# Patient Record
Sex: Male | Born: 1965 | Race: Black or African American | Hispanic: No | Marital: Single | Smoking: Former smoker
Health system: Southern US, Community
[De-identification: ages and names within clinical notes are randomized; demographics above are authoritative.]

## PROBLEM LIST (undated history)

## (undated) DIAGNOSIS — K2980 Duodenitis without bleeding: Secondary | ICD-10-CM

## (undated) DIAGNOSIS — K2210 Ulcer of esophagus without bleeding: Secondary | ICD-10-CM

## (undated) DIAGNOSIS — K2940 Chronic atrophic gastritis without bleeding: Secondary | ICD-10-CM

## (undated) DIAGNOSIS — D751 Secondary polycythemia: Secondary | ICD-10-CM

## (undated) DIAGNOSIS — F329 Major depressive disorder, single episode, unspecified: Secondary | ICD-10-CM

## (undated) DIAGNOSIS — R569 Unspecified convulsions: Secondary | ICD-10-CM

## (undated) DIAGNOSIS — F419 Anxiety disorder, unspecified: Secondary | ICD-10-CM

## (undated) DIAGNOSIS — D649 Anemia, unspecified: Secondary | ICD-10-CM

## (undated) DIAGNOSIS — G809 Cerebral palsy, unspecified: Secondary | ICD-10-CM

## (undated) DIAGNOSIS — F32A Depression, unspecified: Secondary | ICD-10-CM

## (undated) HISTORY — DX: Chronic atrophic gastritis without bleeding: K29.40

## (undated) HISTORY — DX: Secondary polycythemia: D75.1

## (undated) HISTORY — DX: Ulcer of esophagus without bleeding: K22.10

## (undated) HISTORY — DX: Duodenitis without bleeding: K29.80

## (undated) HISTORY — DX: Anemia, unspecified: D64.9

---

## 2009-12-24 ENCOUNTER — Emergency Department (HOSPITAL_COMMUNITY): Admission: EM | Admit: 2009-12-24 | Discharge: 2009-12-24 | Payer: Self-pay | Admitting: Emergency Medicine

## 2011-03-25 ENCOUNTER — Emergency Department (HOSPITAL_COMMUNITY)
Admission: EM | Admit: 2011-03-25 | Discharge: 2011-03-25 | Disposition: A | Discharge status: Home / Self Care | Payer: Medicare Other | Attending: Emergency Medicine | Admitting: Emergency Medicine

## 2011-03-25 ENCOUNTER — Emergency Department (HOSPITAL_COMMUNITY): Payer: Medicare Other

## 2011-03-25 DIAGNOSIS — M546 Pain in thoracic spine: Secondary | ICD-10-CM | POA: Insufficient documentation

## 2011-03-25 DIAGNOSIS — F411 Generalized anxiety disorder: Secondary | ICD-10-CM | POA: Insufficient documentation

## 2011-03-25 DIAGNOSIS — G809 Cerebral palsy, unspecified: Secondary | ICD-10-CM | POA: Insufficient documentation

## 2011-03-25 DIAGNOSIS — I10 Essential (primary) hypertension: Secondary | ICD-10-CM | POA: Insufficient documentation

## 2011-03-25 DIAGNOSIS — M4120 Other idiopathic scoliosis, site unspecified: Secondary | ICD-10-CM | POA: Insufficient documentation

## 2011-03-25 DIAGNOSIS — Z79899 Other long term (current) drug therapy: Secondary | ICD-10-CM | POA: Insufficient documentation

## 2011-03-25 LAB — DIFFERENTIAL
Basophils Absolute: 0 10*3/uL (ref 0.0–0.1)
Basophils Relative: 1 % (ref 0–1)
Eosinophils Relative: 3 % (ref 0–5)
Lymphocytes Relative: 28 % (ref 12–46)
Monocytes Absolute: 0.9 10*3/uL (ref 0.1–1.0)
Neutro Abs: 4 10*3/uL (ref 1.7–7.7)

## 2011-03-25 LAB — URINALYSIS, ROUTINE W REFLEX MICROSCOPIC
Bilirubin Urine: NEGATIVE
Glucose, UA: NEGATIVE mg/dL
Hgb urine dipstick: NEGATIVE
Protein, ur: NEGATIVE mg/dL
pH: 6.5 (ref 5.0–8.0)

## 2011-03-25 LAB — COMPREHENSIVE METABOLIC PANEL
ALT: 14 U/L (ref 0–53)
Albumin: 3.3 g/dL — ABNORMAL LOW (ref 3.5–5.2)
Alkaline Phosphatase: 67 U/L (ref 39–117)
BUN: 14 mg/dL (ref 6–23)
Chloride: 98 mEq/L (ref 96–112)
Potassium: 3.9 mEq/L (ref 3.5–5.1)
Sodium: 133 mEq/L — ABNORMAL LOW (ref 135–145)
Total Bilirubin: 0.4 mg/dL (ref 0.3–1.2)
Total Protein: 7.3 g/dL (ref 6.0–8.3)

## 2011-03-25 LAB — CBC
HCT: 38.1 % — ABNORMAL LOW (ref 39.0–52.0)
Hemoglobin: 12.5 g/dL — ABNORMAL LOW (ref 13.0–17.0)
MCHC: 32.8 g/dL (ref 30.0–36.0)
RDW: 12.9 % (ref 11.5–15.5)
WBC: 7.3 10*3/uL (ref 4.0–10.5)

## 2011-03-27 LAB — URINE CULTURE: Colony Count: 3000

## 2011-03-31 LAB — CULTURE, BLOOD (ROUTINE X 2)
Culture: NO GROWTH
Culture: NO GROWTH

## 2011-04-02 LAB — CBC
HCT: 38.7 % — ABNORMAL LOW (ref 39.0–52.0)
MCV: 90 fL (ref 78.0–100.0)
Platelets: 235 10*3/uL (ref 150–400)
RDW: 14.4 % (ref 11.5–15.5)

## 2011-04-02 LAB — DIFFERENTIAL
Basophils Absolute: 0 10*3/uL (ref 0.0–0.1)
Lymphocytes Relative: 7 % — ABNORMAL LOW (ref 12–46)
Monocytes Absolute: 0.4 10*3/uL (ref 0.1–1.0)
Neutro Abs: 12.1 10*3/uL — ABNORMAL HIGH (ref 1.7–7.7)
Neutrophils Relative %: 89 % — ABNORMAL HIGH (ref 43–77)

## 2011-04-02 LAB — COMPREHENSIVE METABOLIC PANEL
AST: 21 U/L (ref 0–37)
Albumin: 4 g/dL (ref 3.5–5.2)
BUN: 28 mg/dL — ABNORMAL HIGH (ref 6–23)
CO2: 26 mEq/L (ref 19–32)
Calcium: 9.3 mg/dL (ref 8.4–10.5)
Creatinine, Ser: 0.84 mg/dL (ref 0.4–1.5)
GFR calc non Af Amer: >60 mL/min (ref >60)

## 2011-04-02 LAB — PHENOBARBITAL LEVEL: Phenobarbital: 22 ug/mL (ref 15.0–40.0)

## 2012-08-18 DIAGNOSIS — E7800 Pure hypercholesterolemia, unspecified: Secondary | ICD-10-CM | POA: Diagnosis not present

## 2014-01-08 DIAGNOSIS — E7800 Pure hypercholesterolemia, unspecified: Secondary | ICD-10-CM | POA: Diagnosis not present

## 2014-01-08 DIAGNOSIS — G808 Other cerebral palsy: Secondary | ICD-10-CM | POA: Diagnosis not present

## 2014-01-08 DIAGNOSIS — R569 Unspecified convulsions: Secondary | ICD-10-CM | POA: Diagnosis not present

## 2014-01-26 DIAGNOSIS — G808 Other cerebral palsy: Secondary | ICD-10-CM | POA: Diagnosis not present

## 2014-01-26 DIAGNOSIS — R609 Edema, unspecified: Secondary | ICD-10-CM | POA: Diagnosis not present

## 2014-02-01 DIAGNOSIS — N39498 Other specified urinary incontinence: Secondary | ICD-10-CM | POA: Diagnosis not present

## 2014-03-17 DIAGNOSIS — N39498 Other specified urinary incontinence: Secondary | ICD-10-CM | POA: Diagnosis not present

## 2014-04-29 DIAGNOSIS — G808 Other cerebral palsy: Secondary | ICD-10-CM | POA: Diagnosis not present

## 2014-04-29 DIAGNOSIS — E7800 Pure hypercholesterolemia, unspecified: Secondary | ICD-10-CM | POA: Diagnosis not present

## 2014-04-29 DIAGNOSIS — R569 Unspecified convulsions: Secondary | ICD-10-CM | POA: Diagnosis not present

## 2014-04-30 DIAGNOSIS — K2980 Duodenitis without bleeding: Secondary | ICD-10-CM

## 2014-04-30 DIAGNOSIS — K2210 Ulcer of esophagus without bleeding: Secondary | ICD-10-CM

## 2014-04-30 DIAGNOSIS — K2940 Chronic atrophic gastritis without bleeding: Secondary | ICD-10-CM

## 2014-04-30 HISTORY — DX: Ulcer of esophagus without bleeding: K22.10

## 2014-04-30 HISTORY — DX: Duodenitis without bleeding: K29.80

## 2014-04-30 HISTORY — DX: Chronic atrophic gastritis without bleeding: K29.40

## 2014-05-19 DIAGNOSIS — R609 Edema, unspecified: Secondary | ICD-10-CM | POA: Diagnosis not present

## 2014-05-19 DIAGNOSIS — G808 Other cerebral palsy: Secondary | ICD-10-CM | POA: Diagnosis not present

## 2014-05-19 DIAGNOSIS — R799 Abnormal finding of blood chemistry, unspecified: Secondary | ICD-10-CM | POA: Diagnosis not present

## 2014-05-21 ENCOUNTER — Encounter (HOSPITAL_COMMUNITY): Payer: Self-pay | Admitting: Emergency Medicine

## 2014-05-21 ENCOUNTER — Inpatient Hospital Stay (HOSPITAL_COMMUNITY)
Admission: EM | Admit: 2014-05-21 | Discharge: 2014-05-26 | DRG: 812 | Disposition: A | Discharge status: Nursing Home (Includes ICF) | Payer: Medicare Other | Attending: Internal Medicine | Admitting: Internal Medicine

## 2014-05-21 DIAGNOSIS — K620 Anal polyp: Secondary | ICD-10-CM | POA: Diagnosis not present

## 2014-05-21 DIAGNOSIS — D128 Benign neoplasm of rectum: Secondary | ICD-10-CM | POA: Diagnosis not present

## 2014-05-21 DIAGNOSIS — D649 Anemia, unspecified: Secondary | ICD-10-CM | POA: Diagnosis not present

## 2014-05-21 DIAGNOSIS — K208 Other esophagitis without bleeding: Secondary | ICD-10-CM | POA: Diagnosis present

## 2014-05-21 DIAGNOSIS — Z87891 Personal history of nicotine dependence: Secondary | ICD-10-CM | POA: Diagnosis not present

## 2014-05-21 DIAGNOSIS — F329 Major depressive disorder, single episode, unspecified: Secondary | ICD-10-CM | POA: Diagnosis present

## 2014-05-21 DIAGNOSIS — K449 Diaphragmatic hernia without obstruction or gangrene: Secondary | ICD-10-CM | POA: Diagnosis present

## 2014-05-21 DIAGNOSIS — F411 Generalized anxiety disorder: Secondary | ICD-10-CM | POA: Diagnosis present

## 2014-05-21 DIAGNOSIS — K621 Rectal polyp: Secondary | ICD-10-CM | POA: Diagnosis present

## 2014-05-21 DIAGNOSIS — K2940 Chronic atrophic gastritis without bleeding: Secondary | ICD-10-CM | POA: Diagnosis not present

## 2014-05-21 DIAGNOSIS — D129 Benign neoplasm of anus and anal canal: Secondary | ICD-10-CM | POA: Diagnosis not present

## 2014-05-21 DIAGNOSIS — F419 Anxiety disorder, unspecified: Secondary | ICD-10-CM

## 2014-05-21 DIAGNOSIS — D509 Iron deficiency anemia, unspecified: Principal | ICD-10-CM | POA: Diagnosis present

## 2014-05-21 DIAGNOSIS — K922 Gastrointestinal hemorrhage, unspecified: Secondary | ICD-10-CM

## 2014-05-21 DIAGNOSIS — D131 Benign neoplasm of stomach: Secondary | ICD-10-CM | POA: Diagnosis present

## 2014-05-21 DIAGNOSIS — F3289 Other specified depressive episodes: Secondary | ICD-10-CM | POA: Diagnosis present

## 2014-05-21 DIAGNOSIS — G40909 Epilepsy, unspecified, not intractable, without status epilepticus: Secondary | ICD-10-CM | POA: Diagnosis present

## 2014-05-21 DIAGNOSIS — G809 Cerebral palsy, unspecified: Secondary | ICD-10-CM

## 2014-05-21 DIAGNOSIS — G808 Other cerebral palsy: Secondary | ICD-10-CM | POA: Diagnosis not present

## 2014-05-21 DIAGNOSIS — D638 Anemia in other chronic diseases classified elsewhere: Secondary | ICD-10-CM | POA: Diagnosis present

## 2014-05-21 DIAGNOSIS — R1013 Epigastric pain: Secondary | ICD-10-CM | POA: Diagnosis not present

## 2014-05-21 DIAGNOSIS — K209 Esophagitis, unspecified without bleeding: Secondary | ICD-10-CM | POA: Diagnosis not present

## 2014-05-21 DIAGNOSIS — K2980 Duodenitis without bleeding: Secondary | ICD-10-CM | POA: Diagnosis not present

## 2014-05-21 DIAGNOSIS — K2990 Gastroduodenitis, unspecified, without bleeding: Secondary | ICD-10-CM

## 2014-05-21 DIAGNOSIS — K2970 Gastritis, unspecified, without bleeding: Secondary | ICD-10-CM | POA: Diagnosis present

## 2014-05-21 DIAGNOSIS — F32A Depression, unspecified: Secondary | ICD-10-CM | POA: Diagnosis present

## 2014-05-21 HISTORY — DX: Major depressive disorder, single episode, unspecified: F32.9

## 2014-05-21 HISTORY — DX: Anxiety disorder, unspecified: F41.9

## 2014-05-21 HISTORY — DX: Cerebral palsy, unspecified: G80.9

## 2014-05-21 HISTORY — DX: Unspecified convulsions: R56.9

## 2014-05-21 HISTORY — DX: Depression, unspecified: F32.A

## 2014-05-21 LAB — COMPREHENSIVE METABOLIC PANEL
ALT: 13 U/L (ref 0–53)
AST: 16 U/L (ref 0–37)
Albumin: 2.8 g/dL — ABNORMAL LOW (ref 3.5–5.2)
Alkaline Phosphatase: 68 U/L (ref 39–117)
BILIRUBIN TOTAL: 0.2 mg/dL — AB (ref 0.3–1.2)
BUN: 14 mg/dL (ref 6–23)
CHLORIDE: 102 meq/L (ref 96–112)
CO2: 25 meq/L (ref 19–32)
CREATININE: 0.42 mg/dL — AB (ref 0.50–1.35)
Calcium: 8.8 mg/dL (ref 8.4–10.5)
GFR calc Af Amer: >90 mL/min (ref >90)
GLUCOSE: 89 mg/dL (ref 70–99)
Potassium: 4.1 mEq/L (ref 3.7–5.3)
Sodium: 139 mEq/L (ref 137–147)
Total Protein: 8 g/dL (ref 6.0–8.3)

## 2014-05-21 LAB — CBC WITH DIFFERENTIAL/PLATELET
BASOS ABS: 0 10*3/uL (ref 0.0–0.1)
Basophils Relative: 0 % (ref 0–1)
EOS ABS: 0 10*3/uL (ref 0.0–0.7)
Eosinophils Relative: 0 % (ref 0–5)
HEMATOCRIT: 27.1 % — AB (ref 39.0–52.0)
Hemoglobin: 6.6 g/dL — CL (ref 13.0–17.0)
LYMPHS ABS: 1.9 10*3/uL (ref 0.7–4.0)
Lymphocytes Relative: 25 % (ref 12–46)
MCH: 15.4 pg — ABNORMAL LOW (ref 26.0–34.0)
MCHC: 24.4 g/dL — AB (ref 30.0–36.0)
MCV: 63.2 fL — AB (ref 78.0–100.0)
MONO ABS: 0.4 10*3/uL (ref 0.1–1.0)
Monocytes Relative: 5 % (ref 3–12)
NEUTROS ABS: 5.3 10*3/uL (ref 1.7–7.7)
Neutrophils Relative %: 70 % (ref 43–77)
Platelets: 574 10*3/uL — ABNORMAL HIGH (ref 150–400)
RBC: 4.29 MIL/uL (ref 4.22–5.81)
RDW: 22.5 % — AB (ref 11.5–15.5)
WBC: 7.6 10*3/uL (ref 4.0–10.5)

## 2014-05-21 LAB — SAMPLE TO BLOOD BANK

## 2014-05-21 LAB — APTT: APTT: 25 s (ref 24–37)

## 2014-05-21 LAB — PROTIME-INR
INR: 1.26 (ref 0.00–1.49)
Prothrombin Time: 15.5 seconds — ABNORMAL HIGH (ref 11.6–15.2)

## 2014-05-21 LAB — POC OCCULT BLOOD, ED: FECAL OCCULT BLD: POSITIVE — AB

## 2014-05-21 LAB — PREPARE RBC (CROSSMATCH)

## 2014-05-21 LAB — ABO/RH: ABO/RH(D): A POS

## 2014-05-21 MED ORDER — PHENOBARBITAL 32.4 MG PO TABS
32.4000 mg | ORAL_TABLET | Freq: Every day | ORAL | Status: DC
Start: 2014-05-22 — End: 2014-05-26
  Administered 2014-05-22 – 2014-05-26 (×4): 32.4 mg via ORAL
  Filled 2014-05-21 (×4): qty 1

## 2014-05-21 MED ORDER — PANTOPRAZOLE SODIUM 40 MG IV SOLR: 8.0000 mg/h | INTRAVENOUS | Status: DC

## 2014-05-21 MED ORDER — ONDANSETRON HCL 4 MG PO TABS: 4.0000 mg | ORAL_TABLET | Freq: Four times a day (QID) | ORAL | Status: DC | PRN

## 2014-05-21 MED ORDER — PHENOBARBITAL 32.4 MG PO TABS
64.8000 mg | ORAL_TABLET | Freq: Every day | ORAL | Status: DC
  Administered 2014-05-21 – 2014-05-25 (×5): 64.8 mg via ORAL
  Filled 2014-05-21 (×5): qty 2

## 2014-05-21 MED ORDER — PHENOBARBITAL 30 MG PO TABS: 30.0000 mg | ORAL_TABLET | Freq: Two times a day (BID) | ORAL | Status: DC

## 2014-05-21 MED ORDER — SODIUM CHLORIDE 0.9 % IJ SOLN
3.0000 mL | Freq: Two times a day (BID) | INTRAMUSCULAR | Status: DC
  Administered 2014-05-22 – 2014-05-26 (×3): 3 mL via INTRAVENOUS

## 2014-05-21 MED ORDER — SODIUM CHLORIDE 0.9 % IV SOLN
8.0000 mg/h | INTRAVENOUS | Status: DC
  Filled 2014-05-21 (×7): qty 80

## 2014-05-21 MED ORDER — PANTOPRAZOLE SODIUM 40 MG IV SOLR
40.0000 mg | Freq: Two times a day (BID) | INTRAVENOUS | Status: DC
  Administered 2014-05-21 – 2014-05-22 (×2): 40 mg via INTRAVENOUS
  Filled 2014-05-21 (×2): qty 40

## 2014-05-21 MED ORDER — BIOTENE DRY MOUTH MT LIQD
15.0000 mL | Freq: Two times a day (BID) | OROMUCOSAL | Status: DC
  Administered 2014-05-22 – 2014-05-26 (×8): 15 mL via OROMUCOSAL

## 2014-05-21 MED ORDER — CHLORHEXIDINE GLUCONATE 0.12 % MT SOLN
15.0000 mL | Freq: Two times a day (BID) | OROMUCOSAL | Status: DC
Start: 2014-05-21 — End: 2014-05-26
  Administered 2014-05-21 – 2014-05-26 (×10): 15 mL via OROMUCOSAL
  Filled 2014-05-21 (×10): qty 15

## 2014-05-21 MED ORDER — FUROSEMIDE 40 MG PO TABS
40.0000 mg | ORAL_TABLET | Freq: Every day | ORAL | Status: DC
  Administered 2014-05-22 – 2014-05-26 (×4): 40 mg via ORAL
  Filled 2014-05-21 (×4): qty 1

## 2014-05-21 MED ORDER — PNEUMOCOCCAL VAC POLYVALENT 25 MCG/0.5ML IJ INJ
0.5000 mL | INJECTION | INTRAMUSCULAR | Status: AC
  Administered 2014-05-22: 0.5 mL via INTRAMUSCULAR
  Filled 2014-05-21: qty 0.5

## 2014-05-21 MED ORDER — POTASSIUM CHLORIDE CRYS ER 20 MEQ PO TBCR
40.0000 meq | EXTENDED_RELEASE_TABLET | Freq: Once | ORAL | Status: AC
  Administered 2014-05-21: 40 meq via ORAL
  Filled 2014-05-21: qty 2

## 2014-05-21 MED ORDER — QUETIAPINE FUMARATE 25 MG PO TABS
50.0000 mg | ORAL_TABLET | Freq: Two times a day (BID) | ORAL | Status: DC
  Administered 2014-05-21 – 2014-05-26 (×9): 50 mg via ORAL
  Filled 2014-05-21 (×9): qty 2

## 2014-05-21 MED ORDER — ONDANSETRON HCL 4 MG/2ML IJ SOLN: 4.0000 mg | Freq: Four times a day (QID) | INTRAMUSCULAR | Status: DC | PRN

## 2014-05-21 MED ORDER — CYCLOBENZAPRINE HCL 10 MG PO TABS
10.0000 mg | ORAL_TABLET | Freq: Three times a day (TID) | ORAL | Status: DC
  Administered 2014-05-21 – 2014-05-26 (×14): 10 mg via ORAL
  Filled 2014-05-21 (×14): qty 1

## 2014-05-21 MED ORDER — ACETAMINOPHEN 325 MG PO TABS: 650.0000 mg | ORAL_TABLET | Freq: Four times a day (QID) | ORAL | Status: DC | PRN

## 2014-05-21 MED ORDER — ACETAMINOPHEN 650 MG RE SUPP: 650.0000 mg | Freq: Four times a day (QID) | RECTAL | Status: DC | PRN

## 2014-05-21 MED ORDER — LORAZEPAM 1 MG PO TABS: 1.0000 mg | ORAL_TABLET | Freq: Three times a day (TID) | ORAL | Status: DC | PRN

## 2014-05-21 MED ORDER — SERTRALINE HCL 50 MG PO TABS
100.0000 mg | ORAL_TABLET | Freq: Every day | ORAL | Status: DC
  Administered 2014-05-22 – 2014-05-26 (×4): 100 mg via ORAL
  Filled 2014-05-21 (×4): qty 2

## 2014-05-21 NOTE — ED Notes (Signed)
CRITICAL VALUE ALERT  Critical value received:  Hemoglobin - 6.6  Date of notification:  05/21/2014  Time of notification:  8403  Critical value read back: yes  Nurse who received alert:  LJS  MD notified (1st page):  Dr Tomi Bamberger  Time of first page:  1537  MD notified (2nd page):  Time of second page:  Responding MD:  Dr Tomi Bamberger  Time MD responded:  1537

## 2014-05-21 NOTE — ED Notes (Signed)
Consent signed.

## 2014-05-21 NOTE — ED Notes (Signed)
Pt comes from [place] due to low hemoglobin level. Pt went to PCP for regular checkup and was told his hgb was 7.0. Pt denies weakness, pain, SOB.

## 2014-05-21 NOTE — H&P (Signed)
[facility] History and Physical  Ian Hurley ZWC:[id number] DOB: [birth year] DOA: 05/21/2014  Referring physician: Dr. Rolland Porter PCP:  Dr Legrand Rams  Chief Complaint: Sent to the ED from group home for low hemoglobin   HPI:  48 year old male who is a resident of [facility]  group home with history of cerebral palsy, seizure disorders, anxiety and depression who was sent to the ED with low hemoglobin level which was checked at PCP office 2 days back. Reportedly his hb was 7. Poor historian due to cerebral palsy.  . No baseline hb over past 3 years in the system.  Patient reported intermittent epigastric pain describing as aching sensation yesterday. Denies nausea , vomiting, hematemesis, BRBPR or melena. Denies hx of gastric ulcer or having EGD/ colonoscopy in past.. Denies use of NSAIDs. Reports hx of smoking but quit for some years now.  Patient denies headache, dizziness, fever, chills,  chest pain, palpitations, SOB,  bowel or urinary symptoms. Denies change in weight or appetite.  Review of Systems:  Constitutional: Denies fever, chills, diaphoresis, appetite change and fatigue.  HEENT: Denies eye pain, r ear pain, congestion, sore throat, rhinorrhea, sneezing, mouth sores, trouble swallowing, neck pain,  Respiratory: Denies SOB, DOE, cough, chest tightness,  and wheezing.   Cardiovascular: Denies chest pain, palpitations and leg swelling.  Gastrointestinal: epigastric pain, Denies nausea, vomiting,  diarrhea, constipation, blood in stool and abdominal distention.  Genitourinary: Denies dysuria, urgency, frequency, hematuria, flank pain and difficulty urinating.  Endocrine: Denies: hot or cold intolerance, sweats, changes in hair or nails, polyuria, polydipsia. Musculoskeletal: Denies myalgias, back pain, joint swelling, arthralgias  Skin: Denies pallor, rash and wound.  Neurological: Denies dizziness, seizures, syncope, weakness, light-headedness, numbness and headaches.    Past  Medical History  Diagnosis Date  . Cerebral palsy   . Seizures   . Anxiety   . Depression    History reviewed. No pertinent past surgical history. Social History:  reports that he has never smoked. He does not have any smokeless tobacco history on file. He reports that he does not drink alcohol or use illicit drugs.  No Known Allergies  History reviewed. No pertinent family history.  Prior to Admission medications   Medication Sig Start Date End Date Taking? Authorizing Provider  cyclobenzaprine (FLEXERIL) 10 MG tablet Take 10 mg by mouth 3 (three) times daily.   Yes Historical Provider, MD  furosemide (LASIX) 40 MG tablet Take 40 mg by mouth daily.   Yes Historical Provider, MD  liver oil-zinc oxide (BOUDREAUXS BUTT PASTE) 40 % ointment Apply 1 application topically as needed for irritation.   Yes Historical Provider, MD  LORazepam (ATIVAN) 1 MG tablet Take 1 mg by mouth every 8 (eight) hours as needed for anxiety.   Yes Historical Provider, MD  omeprazole (PRILOSEC) 20 MG capsule Take 20 mg by mouth daily.   Yes Historical Provider, MD  PHENObarbital (LUMINAL) 30 MG tablet Take 30-60 mg by mouth 2 (two) times daily. 1 tablet in the morning and 2 tablets at bedtime.   Yes Historical Provider, MD  potassium chloride SA (K-DUR,KLOR-CON) 20 MEQ tablet Take 40 mEq by mouth once.   Yes Historical Provider, MD  QUEtiapine (SEROQUEL) 50 MG tablet Take 50 mg by mouth 2 (two) times daily.   Yes Historical Provider, MD  sertraline (ZOLOFT) 100 MG tablet Take 100 mg by mouth daily.   Yes Historical Provider, MD  traMADol (ULTRAM) 50 MG tablet Take 50 mg by mouth daily as needed for moderate pain.  Yes Historical Provider, MD     Physical Exam:  Filed Vitals:   05/21/14 1359 05/21/14 1400 05/21/14 1430  BP: 137/92 122/93 127/94  Pulse: 99 101 107  Temp: 97.4 F (36.3 C)    TempSrc: Oral    Resp: 18  18  SpO2: 100% 99% 97%    Constitutional: Vital signs reviewed.  Patient is middle  aged male no acute distress.has contractures of hands and short stature. HEENT:  pallor+, no icterus, moist oral mucosa, no cervical lymphadenopathy Cardiovascular: RRR, S1 normal, S2 normal, no MRG Chest: CTAB, no wheezes, rales, or rhonchi Abdominal: Soft. Non-tender, non-distended, bowel sounds are normal, no masses, organomegaly, or guarding present.  Ext: warm, no edema Neurological: A&O x3, contractures of  hand and legs  Labs on Admission:  Basic Metabolic Panel:  Recent Labs Lab 05/21/14 1448  NA 139  K 4.1  CL 102  CO2 25  GLUCOSE 89  BUN 14  CREATININE 0.42*  CALCIUM 8.8   Liver Function Tests:  Recent Labs Lab 05/21/14 1448  AST 16  ALT 13  ALKPHOS 68  BILITOT 0.2*  PROT 8.0  ALBUMIN 2.8*   No results found for this basename: LIPASE, AMYLASE,  in the last 168 hours No results found for this basename: AMMONIA,  in the last 168 hours CBC:  Recent Labs Lab 05/21/14 1448  WBC 7.6  NEUTROABS 5.3  HGB 6.6*  HCT 27.1*  MCV 63.2*  PLT 574*   Cardiac Enzymes: No results found for this basename: CKTOTAL, CKMB, CKMBINDEX, TROPONINI,  in the last 168 hours BNP: No components found with this basename: POCBNP,  CBG: No results found for this basename: GLUCAP,  in the last 168 hours  Radiological Exams on Admission: No results found.  EKG: Sinus tachycardia at 101, amylase daily changes  Assessment/Plan Principal Problem:   Anemia   Active Problems:   Seizure disorder   Anxiety   Cerebral palsy   Depression   Anemia Possibly secondary to GI blood loss. Stool for occult blood positive in the ED. Baseline hemoglobin not known. Admit to telemetry. Will transfuse 2 units PRBC. Monitor H&H every 6 hours. Avoid NSAIDs. I would place him on Protonix drip. -GI consult in the morning for need of EGD/colonoscopy. -Check iron panel, B12 and folate level. Check TSH  Seizure disorder Continue phenobarbital. Continue Ativan when necessary  Anxiety and  depression Continue Zoloft and Seroquel  History of cerebral palsy Continue flexaril for  pain and spasms  Diet:cardiac  DVT prophylaxis: SCD   Code Status: full code Family Communication:  None at bedside Disposition Plan: Return to facility once stable  Meliana Canner [facility] Pager [phone]  Total time spent on admission :70 minutes  If 7PM-7AM, please contact night-coverage [email or web address] Password [facility] 05/21/2014, 5:46 PM

## 2014-05-21 NOTE — ED Notes (Signed)
poc complete positive for blood

## 2014-05-21 NOTE — ED Provider Notes (Signed)
CSN: [id number]     Arrival date & time 05/21/14  1349 History   First MD Initiated Contact with Patient 05/21/14 1357    This chart was scribed for Janice Norrie, MD by Terressa Koyanagi, ED Scribe. This patient was seen in room A304/A304-01 and the patient's care was started at 2:13 PM.  PCP: No primary provider on file.  Chief Complaint  Patient presents with  . Pallor   The history is provided by the patient. No language interpreter was used.   HPI Comments: Rawley Harju is a 48 y.o. male, who is a resident of [facility], who presents to the Emergency Department because of a low hemoglobin level. Specifically, pt had blood work done for a regular check up this week and he was told that his HgB was 7.0. Pt states he feels okay, but also complains of intermittent epigastric/LUQ abd pain "the other day", describing it as an aching sensation. He denies a burning feeling.  He denies SOB nausea. Pt denies having a blood transfusion in the past.  He also denies melena, blood in stool, diarrhea, feeling light headed, dizzy,  vomiting, palpitations, or weakness. Pt denies having a foley catheter. Pt reports he has a motorized wheelchair to get around and denies dizziness when he is using his wheelchair.   PCP Dr Legrand Rams  Past Medical History  Diagnosis Date  . Cerebral palsy   . Seizures   . Anxiety   . Depression    History reviewed. No pertinent past surgical history. History reviewed. No pertinent family history. History  Substance Use Topics  . Smoking status: Never Smoker   . Smokeless tobacco: Not on file  . Alcohol Use: No  lives in a group home Uses a wheelchair  Review of Systems  Respiratory: Negative for shortness of breath.   Cardiovascular: Negative for palpitations.  Gastrointestinal: Positive for abdominal pain (epigastric abd pain). Negative for vomiting and blood in stool.  Neurological: Negative for dizziness and weakness.      Allergies  Review of  patient's allergies indicates no known allergies.  Home Medications   Prior to Admission medications   Not on File   Triage Vitals: BP 137/92  Pulse 99  Temp(Src) 97.4 F (36.3 C) (Oral)  Resp 18  SpO2 100%  Vital signs normal except borderline tachycardia  Physical Exam  Nursing note and vitals reviewed. Constitutional: He is oriented to person, place, and time. He appears well-developed and well-nourished.  Non-toxic appearance. He does not appear ill. No distress.  HENT:  Head: Normocephalic and atraumatic.  Right Ear: External ear normal.  Left Ear: External ear normal.  Nose: Nose normal. No mucosal edema or rhinorrhea.  Mouth/Throat: Oropharynx is clear and moist and mucous membranes are normal. No dental abscesses or uvula swelling.  Eyes: Conjunctivae and EOM are normal. Pupils are equal, round, and reactive to light.  Conjunctiva pale   Neck: Normal range of motion and full passive range of motion without pain. Neck supple.  Cardiovascular: Normal rate, regular rhythm and normal heart sounds.  Exam reveals no gallop and no friction rub.   No murmur heard. Pulmonary/Chest: Effort normal and breath sounds normal. No respiratory distress. He has no wheezes. He has no rhonchi. He has no rales. He exhibits no tenderness and no crepitus.  Abdominal: Soft. Normal appearance and bowel sounds are normal. He exhibits no distension. There is no tenderness. There is no rebound and no guarding.  Musculoskeletal: Normal range of motion. He  exhibits no edema and no tenderness.  Contractual deformities of RUE and bilateral LE is consistent with his Hx of cerebral palsy   Neurological: He is alert and oriented to person, place, and time. He has normal strength. No cranial nerve deficit.  Skin: Skin is warm, dry and intact. No rash noted. No erythema. There is pallor (Palms pale ).  Psychiatric: He has a normal mood and affect. His speech is normal and behavior is normal. His mood appears  not anxious.    ED Course  Procedures (including critical care time) DIAGNOSTIC STUDIES: Oxygen Saturation is 100% on RA, normal by my interpretation.   Medications  Normal saline  COORDINATION OF CARE: 2:23 PM-Discussed treatment plan which includes labs with pt at bedside and pt agreed to plan.   PT prepared for transfusion of PRC's, 2 units. Pt noted to have mild tachycardia during his ED visiti.   16:40 Dr Clementeen Graham, admit to tele, attending Dr Legrand Rams  Results for orders placed during the hospital encounter of 05/21/14  CBC WITH DIFFERENTIAL      Result Value Ref Range   WBC 7.6  4.0 - 10.5 K/uL   RBC 4.29  4.22 - 5.81 MIL/uL   Hemoglobin 6.6 (*) 13.0 - 17.0 g/dL   HCT 27.1 (*) 39.0 - 52.0 %   MCV 63.2 (*) 78.0 - 100.0 fL   MCH 15.4 (*) 26.0 - 34.0 pg   MCHC 24.4 (*) 30.0 - 36.0 g/dL   RDW 22.5 (*) 11.5 - 15.5 %   Platelets 574 (*) 150 - 400 K/uL   Neutrophils Relative % 70  43 - 77 %   Lymphocytes Relative 25  12 - 46 %   Monocytes Relative 5  3 - 12 %   Eosinophils Relative 0  0 - 5 %   Basophils Relative 0  0 - 1 %   Neutro Abs 5.3  1.7 - 7.7 K/uL   Lymphs Abs 1.9  0.7 - 4.0 K/uL   Monocytes Absolute 0.4  0.1 - 1.0 K/uL   Eosinophils Absolute 0.0  0.0 - 0.7 K/uL   Basophils Absolute 0.0  0.0 - 0.1 K/uL   RBC Morphology POLYCHROMASIA PRESENT     Smear Review LARGE PLATELETS PRESENT    COMPREHENSIVE METABOLIC PANEL      Result Value Ref Range   Sodium 139  137 - 147 mEq/L   Potassium 4.1  3.7 - 5.3 mEq/L   Chloride 102  96 - 112 mEq/L   CO2 25  19 - 32 mEq/L   Glucose, Bld 89  70 - 99 mg/dL   BUN 14  6 - 23 mg/dL   Creatinine, Ser 0.42 (*) 0.50 - 1.35 mg/dL   Calcium 8.8  8.4 - 10.5 mg/dL   Total Protein 8.0  6.0 - 8.3 g/dL   Albumin 2.8 (*) 3.5 - 5.2 g/dL   AST 16  0 - 37 U/L   ALT 13  0 - 53 U/L   Alkaline Phosphatase 68  39 - 117 U/L   Total Bilirubin 0.2 (*) 0.3 - 1.2 mg/dL   GFR calc non Af Amer >90  >90 mL/min   GFR calc Af Amer >90  >90 mL/min  APTT       Result Value Ref Range   aPTT 25  24 - 37 seconds  PROTIME-INR      Result Value Ref Range   Prothrombin Time 15.5 (*) 11.6 - 15.2 seconds   INR 1.26  0.00 - 1.49  POC OCCULT BLOOD, ED      Result Value Ref Range   Fecal Occult Bld POSITIVE (*) NEGATIVE  SAMPLE TO BLOOD BANK      Result Value Ref Range   Blood Bank Specimen SAMPLE AVAILABLE FOR TESTING     Sample Expiration 05/23/2014    TYPE AND SCREEN      Result Value Ref Range   ABO/RH(D) A POS     Antibody Screen NEG     Sample Expiration 05/24/2014     Unit Number [id number]     Blood Component Type RED CELLS,LR     Unit division 00     Status of Unit ISSUED     Transfusion Status OK TO TRANSFUSE     Crossmatch Result Compatible     Unit Number [id number]     Blood Component Type RED CELLS,LR     Unit division 00     Status of Unit ALLOCATED     Transfusion Status OK TO TRANSFUSE     Crossmatch Result Compatible    PREPARE RBC (CROSSMATCH)      Result Value Ref Range   Order Confirmation ORDER PROCESSED BY BLOOD BANK    ABO/RH      Result Value Ref Range   ABO/RH(D) A POS     Laboratory interpretation all normal except new anemia (his last HB was 12.5 in March, 2012), Heme + stool    Date: 05/21/2014  Rate: 101  Rhythm: sinus tachycardia  QRS Axis: normal  Intervals: normal  ST/T Wave abnormalities: normal  Conduction Disutrbances:none  Narrative Interpretation: electrode noise  Old EKG Reviewed: none available     MDM   Final diagnoses:  Anemia  GI bleeding    Plan admission   I personally performed the services described in this documentation, which was scribed in my presence. The recorded information has been reviewed and considered.  Rolland Porter, MD, Abram Sander      Janice Norrie, MD 05/21/14 Einar Crow

## 2014-05-22 LAB — HEMOGLOBIN AND HEMATOCRIT, BLOOD
HEMATOCRIT: 32.5 % — AB (ref 39.0–52.0)
HEMATOCRIT: 32.9 % — AB (ref 39.0–52.0)
HEMOGLOBIN: 9.1 g/dL — AB (ref 13.0–17.0)
Hemoglobin: 9.2 g/dL — ABNORMAL LOW (ref 13.0–17.0)

## 2014-05-22 LAB — TYPE AND SCREEN
ABO/RH(D): A POS
Antibody Screen: NEGATIVE
UNIT DIVISION: 0
Unit division: 0

## 2014-05-22 LAB — TSH: TSH: 1.03 u[IU]/mL (ref 0.350–4.500)

## 2014-05-22 LAB — VITAMIN B12: VITAMIN B 12: 656 pg/mL (ref 211–911)

## 2014-05-22 LAB — IRON AND TIBC
IRON: 13 ug/dL — AB (ref 42–135)
Saturation Ratios: 4 % — ABNORMAL LOW (ref 20–55)
TIBC: 346 ug/dL (ref 215–435)
UIBC: 333 ug/dL (ref 125–400)

## 2014-05-22 MED ORDER — PANTOPRAZOLE SODIUM 40 MG PO TBEC
40.0000 mg | DELAYED_RELEASE_TABLET | Freq: Two times a day (BID) | ORAL | Status: DC
  Administered 2014-05-22 – 2014-05-26 (×8): 40 mg via ORAL
  Filled 2014-05-22 (×8): qty 1

## 2014-05-22 MED ORDER — PEG 3350-KCL-NABCB-NACL-NASULF 236 G PO SOLR
4000.0000 mL | Freq: Once | ORAL | Status: AC
  Administered 2014-05-22: 4000 mL via ORAL
  Filled 2014-05-22: qty 4000

## 2014-05-22 MED ORDER — SODIUM CHLORIDE 0.9 % IV SOLN: INTRAVENOUS | Status: DC

## 2014-05-22 MED ORDER — BISACODYL 5 MG PO TBEC
10.0000 mg | DELAYED_RELEASE_TABLET | ORAL | Status: AC
  Administered 2014-05-22 (×2): 10 mg via ORAL
  Filled 2014-05-22 (×2): qty 2

## 2014-05-22 MED ORDER — POTASSIUM CHLORIDE IN NACL 20-0.9 MEQ/L-% IV SOLN
INTRAVENOUS | Status: DC
  Administered 2014-05-22 – 2014-05-23 (×2): via INTRAVENOUS
  Administered 2014-05-24: 50 mL/h via INTRAVENOUS
  Administered 2014-05-25 (×2): via INTRAVENOUS

## 2014-05-22 NOTE — Consult Note (Signed)
Referring Provider: No ref. provider found Primary Care Physician:  No primary provider on file. Primary Gastroenterologist:  Danie Binder  Reason for Consultation:  ANEMIA  HPI:  PT SAW PCP FOR CHEST DISCOMFORT AND SOB. CBC CHECKED AND Hb 6.6. ADMITTED WITH SYMPTOMATIC ANEMIA.  PT DENIES FEVER, CHILLS, BRBPR, nausea, vomiting, melena, diarrhea, constipation, abd pain, problems swallowing, problems with sedation, OR heartburn or indigestion.  Past Medical History  Diagnosis Date  . Cerebral palsy   . Seizures   . Anxiety   . Depression     History reviewed. No pertinent past surgical history.  Prior to Admission medications   Medication Sig Start Date End Date Taking? Authorizing Provider  cyclobenzaprine (FLEXERIL) 10 MG tablet Take 10 mg by mouth 3 (three) times daily.   Yes Historical Provider, MD  furosemide (LASIX) 40 MG tablet Take 40 mg by mouth daily.   Yes Historical Provider, MD  liver oil-zinc oxide (BOUDREAUXS BUTT PASTE) 40 % ointment Apply 1 application topically as needed for irritation.   Yes Historical Provider, MD  LORazepam (ATIVAN) 1 MG tablet Take 1 mg by mouth every 8 (eight) hours as needed for anxiety.   Yes Historical Provider, MD  omeprazole (PRILOSEC) 20 MG capsule Take 20 mg by mouth daily.   Yes Historical Provider, MD  PHENObarbital (LUMINAL) 30 MG tablet Take 30-60 mg by mouth 2 (two) times daily. 1 tablet in the morning and 2 tablets at bedtime.   Yes Historical Provider, MD  potassium chloride SA (K-DUR,KLOR-CON) 20 MEQ tablet Take 40 mEq by mouth once.   Yes Historical Provider, MD  QUEtiapine (SEROQUEL) 50 MG tablet Take 50 mg by mouth 2 (two) times daily.   Yes Historical Provider, MD  sertraline (ZOLOFT) 100 MG tablet Take 100 mg by mouth daily.   Yes Historical Provider, MD  traMADol (ULTRAM) 50 MG tablet Take 50 mg by mouth daily as needed for moderate pain.   Yes Historical Provider, MD    Current Facility-Administered Medications   Medication Dose Route Frequency Provider Last Rate Last Dose  . acetaminophen (TYLENOL) tablet 650 mg  650 mg Oral Q6H PRN Nishant Dhungel, MD       Or  . acetaminophen (TYLENOL) suppository 650 mg  650 mg Rectal Q6H PRN Nishant Dhungel, MD      . antiseptic oral rinse (BIOTENE) solution 15 mL  15 mL Mouth Rinse q12n4p Tesfaye Fanta, MD   15 mL at 05/22/14 1200  . chlorhexidine (PERIDEX) 0.12 % solution 15 mL  15 mL Mouth Rinse BID Rosita Fire, MD   15 mL at 05/22/14 1031  . cyclobenzaprine (FLEXERIL) tablet 10 mg  10 mg Oral TID Nishant Dhungel, MD   10 mg at 05/22/14 1032  . furosemide (LASIX) tablet 40 mg  40 mg Oral Daily Nishant Dhungel, MD   40 mg at 05/22/14 1032  . LORazepam (ATIVAN) tablet 1 mg  1 mg Oral Q8H PRN Nishant Dhungel, MD      . ondansetron (ZOFRAN) tablet 4 mg  4 mg Oral Q6H PRN Nishant Dhungel, MD       Or  . ondansetron (ZOFRAN) injection 4 mg  4 mg Intravenous Q6H PRN Nishant Dhungel, MD      . pantoprazole (PROTONIX) injection 40 mg  40 mg Intravenous Q12H Nishant Dhungel, MD   40 mg at 05/22/14 1031  . PHENobarbital (LUMINAL) tablet 64.8 mg  64.8 mg Oral QHS Rosita Fire, MD   64.8 mg at 05/21/14 2214  And  . PHENobarbital (LUMINAL) tablet 32.4 mg  32.4 mg Oral Daily Rosita Fire, MD   32.4 mg at 05/22/14 1032  . QUEtiapine (SEROQUEL) tablet 50 mg  50 mg Oral BID Nishant Dhungel, MD   50 mg at 05/22/14 1032  . sertraline (ZOLOFT) tablet 100 mg  100 mg Oral Daily Nishant Dhungel, MD   100 mg at 05/22/14 1031  . sodium chloride 0.9 % injection 3 mL  3 mL Intravenous Q12H Nishant Dhungel, MD   3 mL at 05/22/14 1033    Allergies as of 05/21/2014  . (No Known Allergies)    Family History:  Colon Cancer  negative                           Polyps  negative   History   Social History  . Marital Status: Single    Spouse Name: N/A    Number of Children: N/A  . Years of Education: N/A   Social History Main Topics  . Smoking status: Never Smoker   .  Smokeless tobacco: Not on file  . Alcohol Use: No  . Drug Use: No  . Sexual Activity: Not on file   Review of Systems: PER HPI OTHERWISE ALL SYSTEMS ARE NEGATIVE.   Vitals: Blood pressure 109/79, pulse 101, temperature 97.5 F (36.4 C), temperature source Oral, resp. rate 20, SpO2 99.00%.  Physical Exam: General:   Alert,  pleasant and cooperative in NAD Head:  Normocephalic and atraumatic. Eyes:  Sclera clear, no icterus.   Conjunctiva pink. Mouth:  No lesions, dentition ABnormal. Neck:  Supple; no masses. Lungs:  Clear throughout to auscultation.   No wheezes. No acute distress. Heart:  Regular rate and rhythm; no murmurs. Abdomen:  Soft, nontender and nondistended. No masses noted. Normal bowel sounds, without guarding, and without rebound.   Rectal:  Deferred until time of colonoscopy.   Msk:  Symmetrical with gross deformities. ABNormal posture. Extremities:  Without clubbing or edema. Neurologic:  Alert and  oriented x4;  grossly normal neurologically. Cervical Nodes:  No significant cervical adenopathy. Psych:  Alert and cooperative. Normal mood and affect.  Lab Results:  Recent Labs  05/21/14 1448 05/22/14 0430  WBC 7.6  --   HGB 6.6* 9.1*  HCT 27.1* 32.5*  PLT 574*  --    BMET  Recent Labs  05/21/14 1448  NA 139  K 4.1  CL 102  CO2 25  GLUCOSE 89  BUN 14  CREATININE 0.42*  CALCIUM 8.8   LFT  Recent Labs  05/21/14 1448  PROT 8.0  ALBUMIN 2.8*  AST 16  ALT 13  ALKPHOS 68  BILITOT 0.2*     Studies/Results: NONE-RECORDS REVIEWED FROM 2010 TO PRESENT  Impression: ADMITTED WITH MICROCYTIC ANEMIA LIKELY DUE TO COLON POLYPS, LESS LIKELY COLON CA OR GASTRIC CA.  Plan: 1. BOWEL PREP TODAY 2. TCS/? EGD MAY 24 3. BID PPI 4. FULL LIQUID DIET  LOS: 1 day   Cambri Plourde L Erma Joubert  05/22/2014, 11:17 AM

## 2014-05-22 NOTE — Progress Notes (Signed)
Utilization review Completed Tayson Schnelle RN BSN   

## 2014-05-22 NOTE — Progress Notes (Signed)
Subjective: Patient was admitted yesterday due to severe anemia occult stool blood is ++. GI consult is pending.  Objective: Vital signs in last 24 hours: Temp:  [97.4 F (36.3 C)-98.7 F (37.1 C)] 97.5 F (36.4 C) (05/23 0442) Pulse Rate:  [90-118] 101 (05/23 0442) Resp:  [16-20] 20 (05/23 0442) BP: (100-158)/(66-95) 109/79 mmHg (05/23 0442) SpO2:  [96 %-100 %] 99 % (05/23 0442) Weight change:  Last BM Date: 05/19/14  Intake/Output from previous day: 05/22 0701 - 05/23 0700 In: 786.3 [I.V.:500; Blood:286.3] Out: 1 [Urine:1]  PHYSICAL EXAM General appearance: alert and no distress Resp: clear to auscultation bilaterally Cardio: S1, S2 normal GI: soft, non-tender; bowel sounds normal; no masses,  no organomegaly Extremities: contracted knees  Lab Results:    @labtest @ [facility] No results found for this basename: PHART, PCO2, PO2ART, TCO2, HCO3,  in the last 72 hours CULTURES No results found for this or any previous visit (from the past 240 hour(s)). Studies/Results: No results found.  Medications: I have reviewed the patient's current medications.  Assesment: Principal Problem:   Anemia Active Problems:   Seizure disorder   Anxiety   Cerebral palsy   Depression GI bleed   Plan: Medications reviewed Will monitor cbc GI consult.    LOS: 1 day   Alessandra Sawdey 05/22/2014, 8:44 AM

## 2014-05-23 ENCOUNTER — Encounter (HOSPITAL_COMMUNITY)
Admission: EM | Disposition: A | Discharge status: Nursing Home (Includes ICF) | Payer: Self-pay | Source: Home / Self Care | Attending: Internal Medicine

## 2014-05-23 DIAGNOSIS — D649 Anemia, unspecified: Secondary | ICD-10-CM

## 2014-05-23 DIAGNOSIS — K922 Gastrointestinal hemorrhage, unspecified: Secondary | ICD-10-CM

## 2014-05-23 HISTORY — PX: FLEXIBLE SIGMOIDOSCOPY: SHX5431

## 2014-05-23 HISTORY — PX: ESOPHAGOGASTRODUODENOSCOPY: SHX5428

## 2014-05-23 LAB — CBC WITH DIFFERENTIAL/PLATELET
BASOS PCT: 0 % (ref 0–1)
Basophils Absolute: 0 10*3/uL (ref 0.0–0.1)
EOS PCT: 0 % (ref 0–5)
Eosinophils Absolute: 0 10*3/uL (ref 0.0–0.7)
HCT: 33.4 % — ABNORMAL LOW (ref 39.0–52.0)
HEMOGLOBIN: 9.3 g/dL — AB (ref 13.0–17.0)
Lymphocytes Relative: 27 % (ref 12–46)
Lymphs Abs: 1.8 10*3/uL (ref 0.7–4.0)
MCH: 19.4 pg — AB (ref 26.0–34.0)
MCHC: 27.8 g/dL — ABNORMAL LOW (ref 30.0–36.0)
MCV: 69.6 fL — AB (ref 78.0–100.0)
MONO ABS: 0.7 10*3/uL (ref 0.1–1.0)
MONOS PCT: 10 % (ref 3–12)
Neutro Abs: 4.2 10*3/uL (ref 1.7–7.7)
Neutrophils Relative %: 63 % (ref 43–77)
Platelets: 433 10*3/uL — ABNORMAL HIGH (ref 150–400)
RBC: 4.8 MIL/uL (ref 4.22–5.81)
RDW: 26.9 % — ABNORMAL HIGH (ref 11.5–15.5)
WBC: 6.7 10*3/uL (ref 4.0–10.5)

## 2014-05-23 LAB — FERRITIN: Ferritin: 6 ng/mL — ABNORMAL LOW (ref 22–322)

## 2014-05-23 SURGERY — EGD (ESOPHAGOGASTRODUODENOSCOPY)
Anesthesia: Moderate Sedation

## 2014-05-23 MED ORDER — MIDAZOLAM HCL 5 MG/5ML IJ SOLN
INTRAMUSCULAR | Status: DC | PRN
  Administered 2014-05-23: 2 mg via INTRAVENOUS
  Administered 2014-05-23: 1 mg via INTRAVENOUS
  Administered 2014-05-23 (×2): 2 mg via INTRAVENOUS

## 2014-05-23 MED ORDER — MEPERIDINE HCL 100 MG/ML IJ SOLN
INTRAMUSCULAR | Status: AC
  Filled 2014-05-23: qty 2

## 2014-05-23 MED ORDER — MEPERIDINE HCL 100 MG/ML IJ SOLN
INTRAMUSCULAR | Status: DC | PRN
  Administered 2014-05-23 (×3): 25 mg via INTRAVENOUS

## 2014-05-23 MED ORDER — STERILE WATER FOR IRRIGATION IR SOLN
Status: DC | PRN
  Administered 2014-05-23: 10:00:00

## 2014-05-23 MED ORDER — SODIUM CHLORIDE 0.9 % IJ SOLN
INTRAMUSCULAR | Status: AC
  Filled 2014-05-23: qty 10

## 2014-05-23 MED ORDER — MIDAZOLAM HCL 5 MG/5ML IJ SOLN
INTRAMUSCULAR | Status: AC
  Filled 2014-05-23: qty 10

## 2014-05-23 MED ORDER — BISACODYL 5 MG PO TBEC
10.0000 mg | DELAYED_RELEASE_TABLET | ORAL | Status: AC
  Administered 2014-05-23 (×2): 10 mg via ORAL
  Filled 2014-05-23 (×2): qty 2

## 2014-05-23 MED ORDER — PROMETHAZINE HCL 25 MG/ML IJ SOLN
INTRAMUSCULAR | Status: AC
  Filled 2014-05-23: qty 1

## 2014-05-23 MED ORDER — POLYETHYLENE GLYCOL 3350 17 G PO PACK
17.0000 g | PACK | ORAL | Status: AC
  Administered 2014-05-23 (×6): 17 g via ORAL
  Filled 2014-05-23 (×2): qty 1

## 2014-05-23 MED ORDER — LIDOCAINE VISCOUS 2 % MT SOLN
OROMUCOSAL | Status: AC
  Filled 2014-05-23: qty 15

## 2014-05-23 NOTE — Progress Notes (Signed)
Patient completed miralax prep this evening.  Patient having incont stool that are watery and still brown in color, although they do not appear as "milky" as they did earlier today.  Few very small chunks of stools noted.  To have 2 tap water enemas in AM.

## 2014-05-23 NOTE — Progress Notes (Signed)
Patient completed dose of golytlely yesterday evening per report.  Given 2 tap water enemas this morning, results of watery light brown stool.  No "chunks" of stool noted. Patient continues to be incontinent with watery brown stool as well.

## 2014-05-23 NOTE — H&P (View-Only) (Signed)
Referring Provider: No ref. provider found Primary Care Physician:  No primary provider on file. Primary Gastroenterologist:  Danie Binder  Reason for Consultation:  ANEMIA  HPI:  PT SAW PCP FOR CHEST DISCOMFORT AND SOB. CBC CHECKED AND Hb 6.6. ADMITTED WITH SYMPTOMATIC ANEMIA.  PT DENIES FEVER, CHILLS, BRBPR, nausea, vomiting, melena, diarrhea, constipation, abd pain, problems swallowing, problems with sedation, OR heartburn or indigestion.  Past Medical History  Diagnosis Date  . Cerebral palsy   . Seizures   . Anxiety   . Depression     History reviewed. No pertinent past surgical history.  Prior to Admission medications   Medication Sig Start Date End Date Taking? Authorizing Provider  cyclobenzaprine (FLEXERIL) 10 MG tablet Take 10 mg by mouth 3 (three) times daily.   Yes Historical Provider, MD  furosemide (LASIX) 40 MG tablet Take 40 mg by mouth daily.   Yes Historical Provider, MD  liver oil-zinc oxide (BOUDREAUXS BUTT PASTE) 40 % ointment Apply 1 application topically as needed for irritation.   Yes Historical Provider, MD  LORazepam (ATIVAN) 1 MG tablet Take 1 mg by mouth every 8 (eight) hours as needed for anxiety.   Yes Historical Provider, MD  omeprazole (PRILOSEC) 20 MG capsule Take 20 mg by mouth daily.   Yes Historical Provider, MD  PHENObarbital (LUMINAL) 30 MG tablet Take 30-60 mg by mouth 2 (two) times daily. 1 tablet in the morning and 2 tablets at bedtime.   Yes Historical Provider, MD  potassium chloride SA (K-DUR,KLOR-CON) 20 MEQ tablet Take 40 mEq by mouth once.   Yes Historical Provider, MD  QUEtiapine (SEROQUEL) 50 MG tablet Take 50 mg by mouth 2 (two) times daily.   Yes Historical Provider, MD  sertraline (ZOLOFT) 100 MG tablet Take 100 mg by mouth daily.   Yes Historical Provider, MD  traMADol (ULTRAM) 50 MG tablet Take 50 mg by mouth daily as needed for moderate pain.   Yes Historical Provider, MD    Current Facility-Administered Medications   Medication Dose Route Frequency Provider Last Rate Last Dose  . acetaminophen (TYLENOL) tablet 650 mg  650 mg Oral Q6H PRN Nishant Dhungel, MD       Or  . acetaminophen (TYLENOL) suppository 650 mg  650 mg Rectal Q6H PRN Nishant Dhungel, MD      . antiseptic oral rinse (BIOTENE) solution 15 mL  15 mL Mouth Rinse q12n4p Tesfaye Fanta, MD   15 mL at 05/22/14 1200  . chlorhexidine (PERIDEX) 0.12 % solution 15 mL  15 mL Mouth Rinse BID Rosita Fire, MD   15 mL at 05/22/14 1031  . cyclobenzaprine (FLEXERIL) tablet 10 mg  10 mg Oral TID Nishant Dhungel, MD   10 mg at 05/22/14 1032  . furosemide (LASIX) tablet 40 mg  40 mg Oral Daily Nishant Dhungel, MD   40 mg at 05/22/14 1032  . LORazepam (ATIVAN) tablet 1 mg  1 mg Oral Q8H PRN Nishant Dhungel, MD      . ondansetron (ZOFRAN) tablet 4 mg  4 mg Oral Q6H PRN Nishant Dhungel, MD       Or  . ondansetron (ZOFRAN) injection 4 mg  4 mg Intravenous Q6H PRN Nishant Dhungel, MD      . pantoprazole (PROTONIX) injection 40 mg  40 mg Intravenous Q12H Nishant Dhungel, MD   40 mg at 05/22/14 1031  . PHENobarbital (LUMINAL) tablet 64.8 mg  64.8 mg Oral QHS Rosita Fire, MD   64.8 mg at 05/21/14 2214  And  . PHENobarbital (LUMINAL) tablet 32.4 mg  32.4 mg Oral Daily Rosita Fire, MD   32.4 mg at 05/22/14 1032  . QUEtiapine (SEROQUEL) tablet 50 mg  50 mg Oral BID Nishant Dhungel, MD   50 mg at 05/22/14 1032  . sertraline (ZOLOFT) tablet 100 mg  100 mg Oral Daily Nishant Dhungel, MD   100 mg at 05/22/14 1031  . sodium chloride 0.9 % injection 3 mL  3 mL Intravenous Q12H Nishant Dhungel, MD   3 mL at 05/22/14 1033    Allergies as of 05/21/2014  . (No Known Allergies)    Family History:  Colon Cancer  negative                           Polyps  negative   History   Social History  . Marital Status: Single    Spouse Name: N/A    Number of Children: N/A  . Years of Education: N/A   Social History Main Topics  . Smoking status: Never Smoker   .  Smokeless tobacco: Not on file  . Alcohol Use: No  . Drug Use: No  . Sexual Activity: Not on file   Review of Systems: PER HPI OTHERWISE ALL SYSTEMS ARE NEGATIVE.   Vitals: Blood pressure 109/79, pulse 101, temperature 97.5 F (36.4 C), temperature source Oral, resp. rate 20, SpO2 99.00%.  Physical Exam: General:   Alert,  pleasant and cooperative in NAD Head:  Normocephalic and atraumatic. Eyes:  Sclera clear, no icterus.   Conjunctiva pink. Mouth:  No lesions, dentition ABnormal. Neck:  Supple; no masses. Lungs:  Clear throughout to auscultation.   No wheezes. No acute distress. Heart:  Regular rate and rhythm; no murmurs. Abdomen:  Soft, nontender and nondistended. No masses noted. Normal bowel sounds, without guarding, and without rebound.   Rectal:  Deferred until time of colonoscopy.   Msk:  Symmetrical with gross deformities. ABNormal posture. Extremities:  Without clubbing or edema. Neurologic:  Alert and  oriented x4;  grossly normal neurologically. Cervical Nodes:  No significant cervical adenopathy. Psych:  Alert and cooperative. Normal mood and affect.  Lab Results:  Recent Labs  05/21/14 1448 05/22/14 0430  WBC 7.6  --   HGB 6.6* 9.1*  HCT 27.1* 32.5*  PLT 574*  --    BMET  Recent Labs  05/21/14 1448  NA 139  K 4.1  CL 102  CO2 25  GLUCOSE 89  BUN 14  CREATININE 0.42*  CALCIUM 8.8   LFT  Recent Labs  05/21/14 1448  PROT 8.0  ALBUMIN 2.8*  AST 16  ALT 13  ALKPHOS 68  BILITOT 0.2*     Studies/Results: NONE-RECORDS REVIEWED FROM 2010 TO PRESENT  Impression: ADMITTED WITH MICROCYTIC ANEMIA LIKELY DUE TO COLON POLYPS, LESS LIKELY COLON CA OR GASTRIC CA.  Plan: 1. BOWEL PREP TODAY 2. TCS/? EGD MAY 24 3. BID PPI 4. FULL LIQUID DIET  LOS: 1 day   Ian Hurley  05/22/2014, 11:17 AM

## 2014-05-23 NOTE — Op Note (Signed)
[facility] [address] [place], [address]   ENDOSCOPY PROCEDURE REPORT  PATIENT: Ian, Hurley  MR#: [id number] BIRTHDATE: Jan 14, 1966 , 48  yrs. old GENDER: Male  ENDOSCOPIST: Barney Drain, MD REFERRED YI:[id number] Fanta, M.D.  PROCEDURE DATE: 05/23/2014 PROCEDURE:   EGD w/ biopsy INDICATIONS:Anemia. MEDICATIONS: TCS+ Versed 2 mg IV TOPICAL ANESTHETIC:   Viscous Xylocaine  DESCRIPTION OF PROCEDURE:     Physical exam was performed.  Informed consent was obtained from the patient after explaining the benefits, risks, and alternatives to the procedure.  The patient was connected to the monitor and placed in the left lateral position.  Continuous oxygen was provided by nasal cannula and IV medicine administered through an indwelling cannula.  After administration of sedation, the patients esophagus was intubated and the EG-[id number] ([id number])  endoscope was advanced under direct visualization to the second portion of the duodenum.  The scope was removed slowly by carefully examining the color, texture, anatomy, and integrity of the mucosa on the way out.  The patient was recovered in endoscopy and discharged home in satisfactory condition.   ESOPHAGUS: MULTIPLE EROSIONS AND ULCERATIONS SEEN IN DISTAL ESOPHAGUS.  FRIABLE ESOPHAGEAL MUCOSA WHICH ACTIVELY OOZED AFTER COLD FORCEPS BIOPSIES.   A large hiatal hernia was noted. NO CAMERON'S ULCERS.   STOMACH: Mild non-erosive gastritis (inflammation) was found in the gastric antrum.  Multiple biopsies were performed using cold forceps.   A small sessile polyp was found on the greater curvature of the gastric body.  Multiple biopsies was performed using cold forceps.   DUODENUM: The duodenal mucosa showed no abnormalities in the bulb and second portion of the duodenum.  Cold forcep biopsies were taken in the second portion. COMPLICATIONS:   None  ENDOSCOPIC IMPRESSION: 1.   SEVERE ULCERATIVE ESOPHAGITIS LIKELY  CONTRIBUTING TO MICROCYTIC ANEMIA 2.   Large hiatal hernia 3.   MILD Non-erosive gastritis 4.   Small GASTRIC  polyp  RECOMMENDATIONS: BID PPI HOB > 45 DEGREES AT ALL TIMES CLEAR LIQUID DIET CONTINUE BOWEL PREP TCS MAY 25.  CONSIDER GIVENS CAPSULE STUDY IF NO SOURCE FOR ANEMIA IDENTIFIED. AWAIT BIOPSY OPV IN 3 MOS W/ SLF   REPEAT EXAM:   _______________________________ Lorrin MaisBarney Drain, MD 05/23/2014 11:30 AM

## 2014-05-23 NOTE — Plan of Care (Signed)
Problem: Phase I Progression Outcomes Goal: OOB as tolerated unless otherwise ordered Outcome: Not Progressing Bed ridden at baseline; contractures

## 2014-05-23 NOTE — Op Note (Signed)
[facility] [address] [place], [address]   FLEX SIGMOIDOSCOPY PROCEDURE REPORT  PATIENT: Ian Hurley, Ian Hurley  MR#: [id number] BIRTHDATE: 15-Jul-1966 , 48  yrs. old GENDER: Male ENDOSCOPIST: Barney Drain, MD REFERRED YH:[id number] Fanta, M.D. PROCEDURE DATE:  05/23/2014 PROCEDURE:   Sigmoidoscopy, diagnostic INDICATIONS:MICROCYTIC ANEMIA.  NURSING REPORTS PT DRANK ALL OF HIS GO-LYTELY AND HAS TWO TAP WATER ENEMAS THIS AM. MEDICATIONS: Demerol 75 mg IV and Versed 6 mg IV  DESCRIPTION OF PROCEDURE:    Physical exam was performed.  Informed consent was obtained from the patient after explaining the benefits, risks, and alternatives to procedure.  The patient was connected to monitor and placed in left lateral position. Continuous oxygen was provided by nasal cannula and IV medicine administered through an indwelling cannula.  After administration of sedation and rectal exam, the patients rectum was intubated and the EC-3490TLi ([id number]) and EG-2990i ([id number])  colonoscope was advanced under direct visualization to the cecum.  The scope was removed slowly by carefully examining the color, texture, anatomy, and integrity mucosa on the way out.  The patient was recovered in endoscopy and discharged home in satisfactory condition.       COLON FINDINGS: FORMED STOOL IN COLON AND LARGE AMOUNT OF LIQUID STOOL.  PREP QUALITY: poor  COMPLICATIONS: None  ENDOSCOPIC IMPRESSION: FORMED STOOL IN COLON AND LARGE AMOUNT OF LIQUID STOOL.   RECOMMENDATIONS: UNABLE TO COMPLETE COLONOSCOPY DUE TO INADEQUATE BOWEL PREP. CONTINUE BOWEL PREP. TCS MAY 25. ADD FERRITIN TO MAY 22 BLOOD COLLECTION.       _______________________________ Lorrin MaisBarney Drain, MD 05/23/2014 11:33 AM

## 2014-05-23 NOTE — Progress Notes (Signed)
Subjective: Patient is resting. He has no new complaint. He is planned for colonoscopy today.  Objective: Vital signs in last 24 hours: Temp:  [97.4 F (36.3 C)-98 F (36.7 C)] 97.4 F (36.3 C) (05/24 0234) Pulse Rate:  [101-122] 101 (05/24 0234) Resp:  [20] 20 (05/24 0234) BP: (116-128)/(80-83) 116/80 mmHg (05/24 0234) SpO2:  [93 %-99 %] 96 % (05/24 0234) Weight change:  Last BM Date: 05/22/14  Intake/Output from previous day: 05/23 0701 - 05/24 0700 In: -  Out: 100 [Urine:100]  PHYSICAL EXAM General appearance: alert and no distress Resp: clear to auscultation bilaterally Cardio: S1, S2 normal GI: soft, non-tender; bowel sounds normal; no masses,  no organomegaly Extremities: contracted knees  Lab Results:    @labtest @ [facility] No results found for this basename: PHART, PCO2, PO2ART, TCO2, HCO3,  in the last 72 hours CULTURES No results found for this or any previous visit (from the past 240 hour(s)). Studies/Results: No results found.  Medications: I have reviewed the patient's current medications.  Assesment: Principal Problem:   Anemia Active Problems:   Seizure disorder   Anxiety   Cerebral palsy   Depression GI bleed   Plan: Medications reviewed Will monitor cbc GI consult appreciated Colonoscopy as planned.    LOS: 2 days   Ian Hurley 05/23/2014, 9:03 AM

## 2014-05-23 NOTE — Interval H&P Note (Signed)
History and Physical Interval Note:  05/23/2014 10:22 AM  Ian Hurley  has presented today for surgery, with the diagnosis of anemia  The various methods of treatment have been discussed with the patient and family. After consideration of risks, benefits and other options for treatment, the patient has consented to  Procedure(s): COLONOSCOPY (N/A) ESOPHAGOGASTRODUODENOSCOPY (EGD) (N/A) as a surgical intervention .  The patient's history has been reviewed, patient examined, no change in status, stable for surgery.  I have reviewed the patient's chart and labs.  Questions were answered to the patient's satisfaction.     Annissa Andreoni L Tyger Wichman

## 2014-05-24 ENCOUNTER — Encounter (HOSPITAL_COMMUNITY)
Admission: EM | Disposition: A | Discharge status: Nursing Home (Includes ICF) | Payer: Self-pay | Source: Home / Self Care | Attending: Internal Medicine

## 2014-05-24 ENCOUNTER — Encounter (HOSPITAL_COMMUNITY): Payer: Self-pay | Admitting: *Deleted

## 2014-05-24 DIAGNOSIS — K621 Rectal polyp: Secondary | ICD-10-CM

## 2014-05-24 DIAGNOSIS — K620 Anal polyp: Secondary | ICD-10-CM

## 2014-05-24 HISTORY — PX: COLONOSCOPY: SHX5424

## 2014-05-24 SURGERY — COLONOSCOPY
Anesthesia: Moderate Sedation

## 2014-05-24 MED ORDER — SODIUM CHLORIDE 0.9 % IV SOLN
INTRAVENOUS | Status: DC
  Administered 2014-05-24: 09:00:00 via INTRAVENOUS

## 2014-05-24 MED ORDER — METOCLOPRAMIDE HCL 5 MG/ML IJ SOLN
INTRAMUSCULAR | Status: AC
  Filled 2014-05-24: qty 2

## 2014-05-24 MED ORDER — MIDAZOLAM HCL 5 MG/5ML IJ SOLN
INTRAMUSCULAR | Status: DC | PRN
  Administered 2014-05-24: 1 mg via INTRAVENOUS

## 2014-05-24 MED ORDER — ONDANSETRON HCL 4 MG/2ML IJ SOLN
INTRAMUSCULAR | Status: AC
  Filled 2014-05-24: qty 2

## 2014-05-24 MED ORDER — MEPERIDINE HCL 100 MG/ML IJ SOLN
INTRAMUSCULAR | Status: AC
  Filled 2014-05-24: qty 2

## 2014-05-24 MED ORDER — MEPERIDINE HCL 100 MG/ML IJ SOLN
INTRAMUSCULAR | Status: DC | PRN
  Administered 2014-05-24: 25 mg via INTRAVENOUS

## 2014-05-24 MED ORDER — METOCLOPRAMIDE HCL 5 MG/ML IJ SOLN
10.0000 mg | Freq: Once | INTRAMUSCULAR | Status: AC
  Administered 2014-05-24: 10 mg via INTRAVENOUS

## 2014-05-24 MED ORDER — STERILE WATER FOR IRRIGATION IR SOLN
Status: DC | PRN
  Administered 2014-05-24: 10:00:00

## 2014-05-24 MED ORDER — MIDAZOLAM HCL 5 MG/5ML IJ SOLN
INTRAMUSCULAR | Status: AC
  Filled 2014-05-24: qty 10

## 2014-05-24 NOTE — Op Note (Signed)
[facility] [address] [place], [address]   COLONOSCOPY PROCEDURE REPORT  PATIENT: Ian Hurley, Ian Hurley  MR#:         [id number] BIRTHDATE: 02-19-1966 , 48  yrs. old GENDER: Male ENDOSCOPIST: R.  Garfield Cornea, MD FACP FACG REFERRED BY:  Conni Slipper, M.D. PROCEDURE DATE:  05/24/2014 PROCEDURE:     Ileocolonoscopy with snare polypectomy  INDICATIONS: Hemoccult-positive; profound iron deficiency anemia; Hemoglobin up to 9.3 this morning after 2 units of packed red blood cells  INFORMED CONSENT:  The risks, benefits, alternatives and imponderables including but not limited to bleeding, perforation as well as the possibility of a missed lesion have been reviewed.  The potential for biopsy, lesion removal, etc. have also been discussed.  Questions have been answered.  All parties agreeable. Please see the history and physical in the medical record for more information.  MEDICATIONS: Versed 1 mg IV and Demerol 25 mg IV.  DESCRIPTION OF PROCEDURE:  After a digital rectal exam was performed, the EC-3890Li ([id number])  colonoscope was advanced from the anus through the rectum and colon to the area of the cecum, ileocecal valve and appendiceal orifice.  The cecum was deeply intubated.  These structures were well-seen and photographed for the record.  From the level of the cecum and ileocecal valve, the scope was slowly and cautiously withdrawn.  The mucosal surfaces were carefully surveyed utilizing scope tip deflection to facilitate fold flattening as needed.  The scope was pulled down into the rectum where a thorough examination including retroflexion was performed.    FINDINGS:  Initially, prepped still inadequate; after copious washing and lavage, prep became adequate.   (1) 4 mm polyp in the rectum at 5 cm; otherwise rectal mucosa appeared normal. The colonic mucosa appeared normal. The distal 5 cm of terminal ileum mucosa also appeared normal. There is no  blood or clot anywhere in the lower GI tract.  THERAPEUTIC / DIAGNOSTIC MANEUVERS PERFORMED:  The above-mentioned polyp was cold snare removed and recovered.  COMPLICATIONS: None  CECAL WITHDRAWAL TIME:  21 minutes  IMPRESSION:  Single rectal polyp-removed as described above; otherwise normal colonoscopy  RECOMMENDATIONS:   Per plan, will proceed with capsule study of the small intestine later today.  Followup on pathology.   _______________________________ eSigned:  R. Garfield Cornea, MD FACP [facility] 05/24/2014 10:42 AM   CC:    PATIENT NAME:  Ian Hurley, Ian Hurley MR#: [id number]

## 2014-05-24 NOTE — Progress Notes (Signed)
Subjective: Patient is resting. He feels better. He is scheduled for conoloscopy today..  Objective: Vital signs in last 24 hours: Temp:  [97 F (36.1 C)-98.3 F (36.8 C)] 97.9 F (36.6 C) (05/25 0957) Pulse Rate:  [103-123] 105 (05/25 0503) Resp:  [14-26] 18 (05/25 0807) BP: (100-145)/(69-109) 107/72 mmHg (05/25 0503) SpO2:  [93 %-100 %] 95 % (05/25 0503) Weight change:  Last BM Date: 05/23/14  Intake/Output from previous day: 05/24 0701 - 05/25 0700 In: 1574.2 [I.V.:1574.2] Out: 325 [Urine:325]  PHYSICAL EXAM General appearance: alert and no distress Resp: clear to auscultation bilaterally Cardio: S1, S2 normal GI: soft, non-tender; bowel sounds normal; no masses,  no organomegaly Extremities: contracted knees  Lab Results:    @labtest @ [facility] No results found for this basename: PHART, PCO2, PO2ART, TCO2, HCO3,  in the last 72 hours CULTURES No results found for this or any previous visit (from the past 240 hour(s)). Studies/Results: No results found.  Medications: I have reviewed the patient's current medications.  Assesment: Principal Problem:   Anemia Active Problems:   Seizure disorder   Anxiety   Cerebral palsy   Depression GI bleed   Plan: Medications reviewed Will monitor cbc GI consult appreciated Colonoscopy as planned.    LOS: 3 days   Ian Hurley 05/24/2014, 10:33 AM

## 2014-05-24 NOTE — Clinical Social Work Psychosocial (Signed)
Clinical Social Work Department BRIEF PSYCHOSOCIAL ASSESSMENT 05/24/2014  Patient:  Ian Hurley, Ian Hurley     Account Number:  [id number]     Admit date:  05/21/2014  Clinical Social Worker:  Wyatt Haste  Date/Time:  05/24/2014 02:24 PM  Referred by:  CSW  Date Referred:  05/24/2014 Referred for  ALF Placement   Other Referral:   Interview type:  Patient Other interview type:    PSYCHOSOCIAL DATA Living Status:  FACILITY Admitted from facility:  Other Level of care:  [place] Primary support name:  Asir Bingley Primary support relationship to patient:  SIBLING Degree of support available:   limited    CURRENT CONCERNS Current Concerns  Post-Acute Placement   Other Concerns:    SOCIAL WORK ASSESSMENT / PLAN CSW met with pt at bedside. Pt alert and oriented. Admitted with anemia and had colonoscopy today. He has been a resident at [facility] since 2003. Pt's sister, Santiago Glad is HCPOA, but only visits periodically. She lives in [place]. Pt states he has his own room and having this space helps him cope with being in a facility. Pt reports he likes it there and plans to return at d/c. Per [facility], pt requires extensive to total assist with most ADLs, but can feed himself. They use a lift for transfers. Pt has an IT trainer wheelchair at facility. No home health prior to admission, but Elberta Fortis [facility] if needed. Okay for return when medically stable. Pt has diagnoses of anxiety/depression. Elberta Fortis reports all medications are prescribed by Dr. Legrand Rams and pt has not needed mental health follow up.   Assessment/plan status:  Psychosocial Support/Ongoing Assessment of Needs Other assessment/ plan:   Information/referral to community resources:   [facility]berta Fortis [facility]    PATIENT'S/FAMILY'S RESPONSE TO PLAN OF CARE: Pt reports positive feelings regarding return to [facility] when medically stable. CSW will continue to follow.        Benay Pike, [place]

## 2014-05-24 NOTE — Progress Notes (Signed)
Notified Coralyn Pear, RN house supervisor that the patients study capsule will need to be d/c'd and loaded at 1900.  She verbalized understanding and stated that she will d/c'd the study as soon as possible.

## 2014-05-25 DIAGNOSIS — F411 Generalized anxiety disorder: Secondary | ICD-10-CM

## 2014-05-25 LAB — FOLATE RBC: RBC Folate: 1365 ng/mL — ABNORMAL HIGH (ref >280)

## 2014-05-25 NOTE — Progress Notes (Signed)
Capsule study not downloaded this morning.  Will read this afternoon. Orvil Feil, ANP-BC [facility]

## 2014-05-25 NOTE — Progress Notes (Signed)
REVIEWED.  

## 2014-05-25 NOTE — Clinical Social Work Note (Signed)
Chart reviewed, update given to A Huel Cote of [facility]  Facility continues to be willing to accept return at discharge.  Edwyna Shell, LCSW Clinical Social Worker [phone])

## 2014-05-25 NOTE — Care Management Note (Addendum)
    Page 1 of 1   05/26/2014     9:34:57 AM CARE MANAGEMENT NOTE 05/26/2014  Patient:  Ian Hurley, Ian Hurley   Account Number:  [id number]  Date Initiated:  05/25/2014  Documentation initiated by:  Theophilus Kinds  Subjective/Objective Assessment:   Pt admitted from [facility]. Pt requires mod assist with ADL's at facility. Pt will return to facility at discharge.     Action/Plan:   CSW to arrange discharge to facility when medically stable.   Anticipated DC Date:  05/26/2014   Anticipated DC Plan:  ASSISTED LIVING / REST HOME  In-house referral  Clinical Social Worker      DC Planning Services  CM consult      Choice offered to / List presented to:             Status of service:  Completed, signed off Medicare Important Message given?  YES (If response is "NO", the following Medicare IM given date fields will be blank) Date Medicare IM given:  05/26/2014 Date Additional Medicare IM given:    Discharge Disposition:  ASSISTED LIVING  Per UR Regulation:    If discussed at Long Length of Stay Meetings, dates discussed:    Comments:  05/26/14 [place], RN BSN RN Pt discharged to [facility] [place]. CSW to arrange discharge to facility.  05/25/14 Mila Doce, Therapist, sports BSN CM

## 2014-05-25 NOTE — Progress Notes (Signed)
    Subjective: Denies overt GI bleeding. Quite upset, stating he was told he needed surgery. Capsule study completed yesterday, needs to be downloaded prior to reading.   Objective: Vital signs in last 24 hours: Temp:  [97.8 F (36.6 C)-98.5 F (36.9 C)] 98.5 F (36.9 C) (05/26 0657) Pulse Rate:  [109-130] 109 (05/26 0657) Resp:  [20] 20 (05/26 0657) BP: (116-123)/(70-79) 116/70 mmHg (05/26 0657) SpO2:  [95 %-98 %] 95 % (05/26 0657) Last BM Date: 05/24/14 General:   Alert and oriented, upset but quickly calmed and reassured.  Head:  Normocephalic and atraumatic. Eyes:  No icterus, sclera clear. Conjuctiva pink.  Abdomen:  Bowel sounds present, soft, non-tender, non-distended. No HSM or hernias noted.  Msk:  Contractures of hands and legs Neurologic:  Alert and  oriented x4   Intake/Output from previous day: 05/25 0701 - 05/26 0700 In: 1450 [I.V.:1450] Out: 900 [Urine:900] Intake/Output this shift: Total I/O In: 120 [P.O.:120] Out: 100 [Urine:100]  Lab Results:  Recent Labs  05/23/14 0544  WBC 6.7  HGB 9.3*  HCT 33.4*  PLT 433*     Assessment: 48 year old male admitted with profound IDA and heme positive stool, with severe ulcerative esophagitis, mild non-erosive gastritis, small gastric polyp, and colonoscopy with single rectal polyp. Capsule study yesterday and currently still downloading. Will be read this afternoon. Clinically, without signs of overt GI bleeding.   Plan: Capsule study to be read today BID PPI Follow-up on pending biopsies  Orvil Feil, ANP-BC [facility]    LOS: 4 days    05/25/2014, 11:37 AM

## 2014-05-25 NOTE — Progress Notes (Signed)
Subjective: Patient is resting. He is doing better. No rectal bleeding. He had colonoscopy. Yesterday and single polypectomy was done. Capsular study for small intestine bleeding is being performed.  Objective: Vital signs in last 24 hours: Temp:  [97.8 F (36.6 C)-98.5 F (36.9 C)] 98.5 F (36.9 C) (05/26 0657) Pulse Rate:  [103-130] 109 (05/26 0657) Resp:  [15-29] 20 (05/26 0657) BP: (115-152)/(70-114) 116/70 mmHg (05/26 0657) SpO2:  [95 %-100 %] 95 % (05/26 0657) Weight:  [68.947 kg (152 lb)] 68.947 kg (152 lb) (05/25 1134) Weight change:  Last BM Date: 05/24/14  Intake/Output from previous day: 05/25 0701 - 05/26 0700 In: 1450 [I.V.:1450] Out: 900 [Urine:900]  PHYSICAL EXAM General appearance: alert and no distress Resp: clear to auscultation bilaterally Cardio: S1, S2 normal GI: soft, non-tender; bowel sounds normal; no masses,  no organomegaly Extremities: contracted knees  Lab Results:    @labtest @ [facility] No results found for this basename: PHART, PCO2, PO2ART, TCO2, HCO3,  in the last 72 hours CULTURES No results found for this or any previous visit (from the past 240 hour(s)). Studies/Results: No results found.  Medications: I have reviewed the patient's current medications.  Assesment: Principal Problem:   Anemia Active Problems:   Seizure disorder   Anxiety   Cerebral palsy   Depression GI bleed   Plan: Medications reviewed Will monitor cbc As per GI plan    LOS: 4 days   Ana Liaw 05/25/2014, 8:10 AM

## 2014-05-25 NOTE — Procedures (Signed)
Small Bowel Givens Capsule Study Procedure date:  05/25/14  Referring Provider:  Dr. Gala Romney   Indication for procedure:   48 year old male admitted with profound IDA and heme positive stool, with severe ulcerative esophagitis, mild non-erosive gastritis, small gastric polyp, and colonoscopy with single rectal polyp. No overt signs of GI bleeding. Capsule study now indicated to wrap up GI evaluation for IDA.     Findings:   Extremely poor visualization of entire small bowel due to debris, bile-stained mucosa, and likely old blood from previous EGD and biopsies. Questionable scattered superficial erosions but no outright bleeding or ulcerations noted. Quite difficult to determine the exact moment when capsule moves from ileum to cecum; however, by the end of the study, it is apparent that the capsule has made it to the cecum.  As noted earlier, visualization of small bowel limited overall due to debris.   First Gastric image:  00:02:15 First Duodenal image: 00:31:19 First Ileo-Cecal Valve image: Unable to identify First Cecal image: Approximately 6:29:41 Gastric Passage time: 0h 37m Small Bowel Passage time:  5h 62m  Summary & Recommendations: IDA likely secondary to severe ulcerative esophagitis; no obvious AVMs, areas of outright bleeding, or worrisome findings on capsule study. Will check celiac serologies to be complete, although this is less likely. Recommend continuing Protonix BID, start ferrous sulfate BID, and recheck CBC, iron, and ferritin in 3 months. May need referral to Hematology if no significant improvement at that time.   Orvil Feil, ANP-BC [facility]

## 2014-05-26 ENCOUNTER — Telehealth: Payer: Self-pay | Admitting: Gastroenterology

## 2014-05-26 ENCOUNTER — Encounter (HOSPITAL_COMMUNITY): Payer: Self-pay | Admitting: Gastroenterology

## 2014-05-26 DIAGNOSIS — D649 Anemia, unspecified: Secondary | ICD-10-CM

## 2014-05-26 MED ORDER — PANTOPRAZOLE SODIUM 40 MG PO TBEC: 40.0000 mg | DELAYED_RELEASE_TABLET | Freq: Two times a day (BID) | ORAL | Status: AC

## 2014-05-26 NOTE — Telephone Encounter (Signed)
Capsule study reviewed. No evidence of overt GI bleeding.  Needs to continue PPI BID Please call in ferrous sulfate 325 mg po BID to pharmacy  Recheck CBC, iron, ferritin in 3 months.

## 2014-05-26 NOTE — Clinical Social Work Note (Signed)
Pt d/c today back to [facility]. Pt and facility aware and agreeable. Facility to provide transport. Pt does not want his sister contacted about d/c. Per MD, potassium should be once daily, not once as listed on d/c summary. Verbal order given to RN.   Benay Pike, [place]

## 2014-05-26 NOTE — Progress Notes (Signed)
UR chart review completed.  

## 2014-05-26 NOTE — Progress Notes (Signed)
Patient to be discharged today. Will order celiac serologies so this may be drawn prior to discharge. Will arrange follow-up as outpatient.   Orvil Feil, ANP-BC [facility]

## 2014-05-26 NOTE — Progress Notes (Signed)
Patient awaiting transport for discharge back to [facility].  IV removed - WNL.  Facility aware of discharge.  Patient stable at this time.  No questions at this time.

## 2014-05-26 NOTE — Discharge Summary (Signed)
Physician Discharge Summary  Patient ID: Ian Hurley MRN: [id number] DOB/AGE: 48/15/1967 48 y.o. Primary Care Physician:No primary provider on file. Admit date: 05/21/2014 Discharge date: 05/26/2014    Discharge Diagnoses:   Principal Problem:   Anemia Active Problems:   Seizure disorder   Anxiety   Cerebral palsy   Depression     Medication List    STOP taking these medications       omeprazole 20 MG capsule  Commonly known as:  PRILOSEC      TAKE these medications       BOUDREAUXS BUTT PASTE 40 % ointment  Generic drug:  liver oil-zinc oxide  Apply 1 application topically as needed for irritation.     cyclobenzaprine 10 MG tablet  Commonly known as:  FLEXERIL  Take 10 mg by mouth 3 (three) times daily.     furosemide 40 MG tablet  Commonly known as:  LASIX  Take 40 mg by mouth daily.     LORazepam 1 MG tablet  Commonly known as:  ATIVAN  Take 1 mg by mouth every 8 (eight) hours as needed for anxiety.     pantoprazole 40 MG tablet  Commonly known as:  PROTONIX  Take 1 tablet (40 mg total) by mouth 2 (two) times daily before a meal.     PHENObarbital 30 MG tablet  Commonly known as:  LUMINAL  Take 30-60 mg by mouth 2 (two) times daily. 1 tablet in the morning and 2 tablets at bedtime.     potassium chloride SA 20 MEQ tablet  Commonly known as:  K-DUR,KLOR-CON  Take 40 mEq by mouth once.     QUEtiapine 50 MG tablet  Commonly known as:  SEROQUEL  Take 50 mg by mouth 2 (two) times daily.     sertraline 100 MG tablet  Commonly known as:  ZOLOFT  Take 100 mg by mouth daily.     traMADol 50 MG tablet  Commonly known as:  ULTRAM  Take 50 mg by mouth daily as needed for moderate pain.        Discharged Condition: improved    Consults: GI  Significant Diagnostic Studies: No results found.  Lab Results: Basic Metabolic Panel: No results found for this basename: NA, K, CL, CO2, GLUCOSE, BUN, CREATININE, CALCIUM, MG, PHOS,  in the last 72  hours Liver Function Tests: No results found for this basename: AST, ALT, ALKPHOS, BILITOT, PROT, ALBUMIN,  in the last 72 hours   CBC: No results found for this basename: WBC, NEUTROABS, HGB, HCT, MCV, PLT,  in the last 72 hours  No results found for this or any previous visit (from the past 240 hour(s)).   Hospital Course:   This is a 48 years old male patient with history of multiple medical illnesses was admitted due to severe Anemia. He was evaluated by GI and EGD and colonoscopy was done. He was transfused 2 units of PRBC. No active bleeding site was found. Patient had capsular study and result is pending. His hemoglobin is improved.  Discharge Exam: Blood pressure 110/74, pulse 87, temperature 97.6 F (36.4 C), temperature source Oral, resp. rate 16, height 5' (1.524 m), weight 68.947 kg (152 lb), SpO2 96.00%.   Disposition:  Assisted living.        Follow-up Information   Follow up In 2 weeks.      Signed: Rosita Fire   05/26/2014, 8:20 AM

## 2014-05-27 ENCOUNTER — Other Ambulatory Visit: Payer: Medicare Other

## 2014-05-27 DIAGNOSIS — D649 Anemia, unspecified: Secondary | ICD-10-CM

## 2014-05-27 LAB — TISSUE TRANSGLUTAMINASE, IGA: Tissue Transglutaminase Ab, IgA: 4.1 U/mL (ref <20)

## 2014-05-27 LAB — IGA: IgA: 393 mg/dL — ABNORMAL HIGH (ref 68–379)

## 2014-05-27 NOTE — Telephone Encounter (Signed)
I called the facility and spoke to [place]. She said she stays with pt but the administrator takes care of the meds and medical information.  Carin Primrose: Home: [phone]                             Cell:     [phone]

## 2014-05-27 NOTE — Telephone Encounter (Signed)
Rx called to [facility] with 3 refills.

## 2014-05-27 NOTE — Telephone Encounter (Signed)
I called and spoke to [facility]. She was informed of results and plan.  I will call iron to [facility]. Lab orders on file for August and she is aware they will be mailed to them.

## 2014-05-29 ENCOUNTER — Encounter: Payer: Self-pay | Admitting: Internal Medicine

## 2014-06-01 NOTE — Procedures (Signed)
REVIEWED.  

## 2014-06-02 DIAGNOSIS — D649 Anemia, unspecified: Secondary | ICD-10-CM | POA: Diagnosis not present

## 2014-06-02 DIAGNOSIS — R569 Unspecified convulsions: Secondary | ICD-10-CM | POA: Diagnosis not present

## 2014-06-02 DIAGNOSIS — G808 Other cerebral palsy: Secondary | ICD-10-CM | POA: Diagnosis not present

## 2014-06-07 NOTE — Progress Notes (Signed)
Quick Note:  IgA mildly elevated, non-specific. Celiac serologies normal. ______

## 2014-06-10 NOTE — Progress Notes (Signed)
Quick Note:  Called and informed Dionicio Stall and she will let the administrator know. ______

## 2014-06-16 ENCOUNTER — Encounter: Payer: Self-pay | Admitting: Gastroenterology

## 2014-06-16 NOTE — Telephone Encounter (Signed)
ERROR

## 2014-06-16 NOTE — Telephone Encounter (Addendum)
Please call pt. His BIOPSIES SHOW ESOPHAGITIS,  ATROPHIC GASTRITIS, AND DUODENITIS. PART OF HIS LOW BLOOD COUNT IS DUE TO INFLAMMATION IN HIS ESOPHAGUS AND SMALL BOWEL. HIS NEED FOR PROTONIX AND ATROPHIC GASTRITIS, WHICH MEANS HE DOES NOT HAVE ENOUGH ACID PRODUCING CELLS, MAY MAKE IT DIFFICULT FOR HIM TO ABSORB ORAL IRON.  1. CONTINUE WITH HOB > 45 DEGREES AT ALL TIMES. 2. SIT AT 90 DEGREES AFTER EATING FOR 30 MINS. 3. CONTINUE PROTONIX. TAKE 30 MINUTES PRIOR TO MEALS TWICE DAILY. 4. RECHECK CBC/FERRITIN AROUND JUL 23 5. IF HIS BLOOD COUNT/IRON STORES ARE NOT IMPROVED, HE WILL NEED TO SEE HEMATOLOGY FOR IV IRON INFUSIONS. 6. OPV SEP 2015 E30 ANEMIA/ESOPHAGITIS/ATROPHIC GASTRITIS

## 2014-06-22 NOTE — Telephone Encounter (Signed)
Called and informed caregiver, Carin Primrose. Also faxed the info to him at [phone]. Lab orders on file to be mailed.

## 2014-06-23 NOTE — Telephone Encounter (Signed)
REMINDER IN EPIC °

## 2014-06-24 DIAGNOSIS — R569 Unspecified convulsions: Secondary | ICD-10-CM | POA: Diagnosis not present

## 2014-06-24 DIAGNOSIS — N390 Urinary tract infection, site not specified: Secondary | ICD-10-CM | POA: Diagnosis not present

## 2014-07-19 ENCOUNTER — Other Ambulatory Visit: Payer: Self-pay | Admitting: *Deleted

## 2014-07-19 DIAGNOSIS — D649 Anemia, unspecified: Secondary | ICD-10-CM

## 2014-08-02 ENCOUNTER — Other Ambulatory Visit: Payer: Self-pay | Admitting: *Deleted

## 2014-08-02 DIAGNOSIS — D649 Anemia, unspecified: Secondary | ICD-10-CM | POA: Diagnosis not present

## 2014-08-02 LAB — CBC WITH DIFFERENTIAL/PLATELET
BASOS PCT: 1 % (ref 0–1)
Basophils Absolute: 0.1 10*3/uL (ref 0.0–0.1)
EOS ABS: 0 10*3/uL (ref 0.0–0.7)
Eosinophils Relative: 0 % (ref 0–5)
HEMATOCRIT: 50 % (ref 39.0–52.0)
HEMOGLOBIN: 16.5 g/dL (ref 13.0–17.0)
Lymphocytes Relative: 26 % (ref 12–46)
Lymphs Abs: 1.3 10*3/uL (ref 0.7–4.0)
MCH: 28 pg (ref 26.0–34.0)
MCHC: 33 g/dL (ref 30.0–36.0)
MCV: 84.9 fL (ref 78.0–100.0)
MONO ABS: 0.5 10*3/uL (ref 0.1–1.0)
MONOS PCT: 9 % (ref 3–12)
NEUTROS ABS: 3.2 10*3/uL (ref 1.7–7.7)
Neutrophils Relative %: 64 % (ref 43–77)
Platelets: 266 10*3/uL (ref 150–400)
RBC: 5.89 MIL/uL — AB (ref 4.22–5.81)
RDW: 17.9 % — ABNORMAL HIGH (ref 11.5–15.5)
WBC: 5 10*3/uL (ref 4.0–10.5)

## 2014-08-02 LAB — IRON: Iron: 48 ug/dL (ref 42–165)

## 2014-08-02 LAB — FERRITIN: FERRITIN: 41 ng/mL (ref 22–322)

## 2014-08-02 MED ORDER — FERROUS SULFATE 325 (65 FE) MG PO TABS
325.0000 mg | ORAL_TABLET | Freq: Two times a day (BID) | ORAL | Status: DC
Start: 2014-08-02 — End: 2014-11-30

## 2014-08-03 NOTE — Progress Notes (Signed)
Quick Note:  Patient has appt later this August with Dr. Oneida Alar. Hgb now normal. Iron, ferritin both improved and now normal. ______

## 2014-08-04 NOTE — Progress Notes (Signed)
Quick Note:  I called and informed Berton Lan at the [facility]. She is aware of pt's appt also with Dr. Oneida Alar on 08/25/2014 at 11:00 AM. ______

## 2014-08-25 ENCOUNTER — Encounter: Payer: Self-pay | Admitting: Gastroenterology

## 2014-08-25 ENCOUNTER — Encounter (INDEPENDENT_AMBULATORY_CARE_PROVIDER_SITE_OTHER): Payer: Self-pay

## 2014-08-25 ENCOUNTER — Ambulatory Visit (INDEPENDENT_AMBULATORY_CARE_PROVIDER_SITE_OTHER): Payer: Medicare Other | Admitting: Gastroenterology

## 2014-08-25 VITALS — BP 124/91 | HR 120 | Temp 97.7°F

## 2014-08-25 DIAGNOSIS — D509 Iron deficiency anemia, unspecified: Secondary | ICD-10-CM

## 2014-08-25 NOTE — Progress Notes (Signed)
Subjective:    Patient ID: Ian Hurley, male    DOB: [birth year], 48 y.o.   MRN: [id number]  HPI Appetitie is good. blood count better. No pain with swallowing or trouble swallowing. NO BRBPR OR MELENA.  Past Medical History  Diagnosis Date  . Cerebral palsy   . Seizures   . Anxiety   . Depression   . 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 Erosive esophagitis MAY 2015    HB 6.6 MCV 63 FERRITIN 6  . Duodenitis MAY 2015    HB 6.6 MCV 63 FERRITIN 6   Past Surgical History  Procedure Laterality Date  . Esophagogastroduodenoscopy N/A 05/23/2014    Procedure: ESOPHAGOGASTRODUODENOSCOPY (EGD);  Surgeon: Danie Binder, MD;  [facility];  Service: Endoscopy;  Laterality: N/A;  . Flexible sigmoidoscopy N/A 05/23/2014    Procedure: FLEXIBLE SIGMOIDOSCOPY;  Surgeon: Danie Binder, MD;  [facility];  Service: Endoscopy;  Laterality: N/A;  . Colonoscopy N/A 05/24/2014    Procedure: COLONOSCOPY;  Surgeon: Daneil Dolin, MD;  [facility];  Service: Endoscopy;  Laterality: N/A;    No Known Allergies  Current Outpatient Prescriptions  Medication Sig Dispense Refill  . cyclobenzaprine (FLEXERIL) 10 MG tablet Take 10 mg by mouth 3 (three) times daily.    . ferrous sulfate 325 (65 FE) MG tablet Take 1 tablet (325 mg total) by mouth 2 (two) times daily with a meal.    . furosemide (LASIX) 40 MG tablet Take 40 mg by mouth daily.    Marland Kitchen liver oil-zinc oxide (BOUDREAUXS BUTT PASTE) 40 % ointment Apply 1 application topically as needed for irritation.    Marland Kitchen LORazepam (ATIVAN) 1 MG tablet Take 1 mg by mouth every 8 (eight) hours as needed for anxiety.    . pantoprazole (PROTONIX) 40 MG tablet Take 1 tablet (40 mg total) by mouth 2 (two) times daily before a meal.    . PHENObarbital (LUMINAL) 30 MG tablet Take 30-60 mg by mouth 2 (two) times daily. 1 tablet in the morning and 2 tablets at bedtime.      . potassium chloride SA (K-DUR,KLOR-CON) 20  MEQ tablet Take 40 mEq by mouth once.      Marland Kitchen QUEtiapine (SEROQUEL) 50 MG tablet Take 50 mg by mouth 2 (two) times daily.      . sertraline (ZOLOFT) 100 MG tablet Take 100 mg by mouth daily.      . traMADol (ULTRAM) 50 MG tablet Take 50 mg by mouth daily as needed for moderate pain.            Review of Systems     Objective:   Physical Exam  Constitutional: He is oriented to person, place, and time. He appears well-developed and well-nourished. No distress.  HENT:  Head: Normocephalic and atraumatic.  Eyes: Pupils are equal, round, and reactive to light. No scleral icterus.  Neck: Normal range of motion. Neck supple.  Cardiovascular: Normal rate, regular rhythm and normal heart sounds.   Pulmonary/Chest: Effort normal and breath sounds normal. No respiratory distress.  Abdominal: Soft. Bowel sounds are normal. He exhibits no distension. There is no tenderness.  Musculoskeletal: He exhibits no edema.  Neurological: He is alert and oriented to person, place, and time.  NO  NEW FOCAL DEFICITS   Psychiatric: He has a normal mood and affect.          Assessment & Plan:

## 2014-08-25 NOTE — Progress Notes (Signed)
REMINDER IN EPIC °

## 2014-08-25 NOTE — Patient Instructions (Signed)
CONTINUE IRON.  FOLLOW A LOW FAT/HIGH FIBER DIET.  FOLLOW UP IN 6-12 MOS.

## 2014-08-25 NOTE — Assessment & Plan Note (Addendum)
SX IMPROVED. STILL ON FESO4.   CONTINUE PROTONIX. TAKE 30 MINUTES PRIOR TO MEALS TWICE DAILY. LOW FTA/HIGH FIBER DIET FOLLOW UP IN 6 MOS. MERRY CHRISTMAS AND HAPPY NEW YEAR!

## 2014-08-31 ENCOUNTER — Encounter: Payer: Self-pay | Admitting: Gastroenterology

## 2014-10-07 DIAGNOSIS — Z23 Encounter for immunization: Secondary | ICD-10-CM | POA: Diagnosis not present

## 2014-10-07 DIAGNOSIS — R32 Unspecified urinary incontinence: Secondary | ICD-10-CM | POA: Diagnosis not present

## 2014-10-07 DIAGNOSIS — R569 Unspecified convulsions: Secondary | ICD-10-CM | POA: Diagnosis not present

## 2014-10-07 DIAGNOSIS — G800 Spastic quadriplegic cerebral palsy: Secondary | ICD-10-CM | POA: Diagnosis not present

## 2014-11-30 ENCOUNTER — Other Ambulatory Visit: Payer: Self-pay | Admitting: Gastroenterology

## 2015-01-04 DIAGNOSIS — J40 Bronchitis, not specified as acute or chronic: Secondary | ICD-10-CM | POA: Diagnosis not present

## 2015-01-31 ENCOUNTER — Encounter: Payer: Self-pay | Admitting: Gastroenterology

## 2015-02-24 DIAGNOSIS — G809 Cerebral palsy, unspecified: Secondary | ICD-10-CM | POA: Diagnosis not present

## 2015-02-24 DIAGNOSIS — I10 Essential (primary) hypertension: Secondary | ICD-10-CM | POA: Diagnosis not present

## 2015-02-24 DIAGNOSIS — R569 Unspecified convulsions: Secondary | ICD-10-CM | POA: Diagnosis not present

## 2015-02-24 DIAGNOSIS — Q898 Other specified congenital malformations: Secondary | ICD-10-CM | POA: Diagnosis not present

## 2015-03-03 DIAGNOSIS — G809 Cerebral palsy, unspecified: Secondary | ICD-10-CM | POA: Diagnosis not present

## 2015-03-03 DIAGNOSIS — M24562 Contracture, left knee: Secondary | ICD-10-CM | POA: Diagnosis not present

## 2015-03-03 DIAGNOSIS — M24521 Contracture, right elbow: Secondary | ICD-10-CM | POA: Diagnosis not present

## 2015-03-03 DIAGNOSIS — I10 Essential (primary) hypertension: Secondary | ICD-10-CM | POA: Diagnosis not present

## 2015-03-03 DIAGNOSIS — Q898 Other specified congenital malformations: Secondary | ICD-10-CM | POA: Diagnosis not present

## 2015-03-03 DIAGNOSIS — M24561 Contracture, right knee: Secondary | ICD-10-CM | POA: Diagnosis not present

## 2015-03-11 DIAGNOSIS — G809 Cerebral palsy, unspecified: Secondary | ICD-10-CM | POA: Diagnosis not present

## 2015-03-11 DIAGNOSIS — I10 Essential (primary) hypertension: Secondary | ICD-10-CM | POA: Diagnosis not present

## 2015-03-11 DIAGNOSIS — M24562 Contracture, left knee: Secondary | ICD-10-CM | POA: Diagnosis not present

## 2015-03-11 DIAGNOSIS — M24521 Contracture, right elbow: Secondary | ICD-10-CM | POA: Diagnosis not present

## 2015-03-11 DIAGNOSIS — M24561 Contracture, right knee: Secondary | ICD-10-CM | POA: Diagnosis not present

## 2015-03-11 DIAGNOSIS — Q898 Other specified congenital malformations: Secondary | ICD-10-CM | POA: Diagnosis not present

## 2015-03-15 DIAGNOSIS — M24521 Contracture, right elbow: Secondary | ICD-10-CM | POA: Diagnosis not present

## 2015-03-15 DIAGNOSIS — I10 Essential (primary) hypertension: Secondary | ICD-10-CM | POA: Diagnosis not present

## 2015-03-15 DIAGNOSIS — G809 Cerebral palsy, unspecified: Secondary | ICD-10-CM | POA: Diagnosis not present

## 2015-03-15 DIAGNOSIS — M24562 Contracture, left knee: Secondary | ICD-10-CM | POA: Diagnosis not present

## 2015-03-15 DIAGNOSIS — Q898 Other specified congenital malformations: Secondary | ICD-10-CM | POA: Diagnosis not present

## 2015-03-15 DIAGNOSIS — M24561 Contracture, right knee: Secondary | ICD-10-CM | POA: Diagnosis not present

## 2015-03-17 DIAGNOSIS — M24521 Contracture, right elbow: Secondary | ICD-10-CM | POA: Diagnosis not present

## 2015-03-17 DIAGNOSIS — M24561 Contracture, right knee: Secondary | ICD-10-CM | POA: Diagnosis not present

## 2015-03-17 DIAGNOSIS — I10 Essential (primary) hypertension: Secondary | ICD-10-CM | POA: Diagnosis not present

## 2015-03-17 DIAGNOSIS — G809 Cerebral palsy, unspecified: Secondary | ICD-10-CM | POA: Diagnosis not present

## 2015-03-17 DIAGNOSIS — Q898 Other specified congenital malformations: Secondary | ICD-10-CM | POA: Diagnosis not present

## 2015-03-17 DIAGNOSIS — M24562 Contracture, left knee: Secondary | ICD-10-CM | POA: Diagnosis not present

## 2015-03-18 ENCOUNTER — Encounter (HOSPITAL_COMMUNITY): Payer: Self-pay | Admitting: *Deleted

## 2015-03-18 ENCOUNTER — Emergency Department (HOSPITAL_COMMUNITY)
Admission: EM | Admit: 2015-03-18 | Discharge: 2015-03-18 | Disposition: A | Discharge status: Home / Self Care | Payer: Medicare Other | Attending: Emergency Medicine | Admitting: Emergency Medicine

## 2015-03-18 ENCOUNTER — Emergency Department (HOSPITAL_COMMUNITY): Payer: Medicare Other

## 2015-03-18 DIAGNOSIS — Z79899 Other long term (current) drug therapy: Secondary | ICD-10-CM | POA: Insufficient documentation

## 2015-03-18 DIAGNOSIS — F419 Anxiety disorder, unspecified: Secondary | ICD-10-CM | POA: Diagnosis not present

## 2015-03-18 DIAGNOSIS — Z8719 Personal history of other diseases of the digestive system: Secondary | ICD-10-CM | POA: Insufficient documentation

## 2015-03-18 DIAGNOSIS — R000 Tachycardia, unspecified: Secondary | ICD-10-CM | POA: Insufficient documentation

## 2015-03-18 DIAGNOSIS — R609 Edema, unspecified: Secondary | ICD-10-CM | POA: Diagnosis not present

## 2015-03-18 DIAGNOSIS — Z8669 Personal history of other diseases of the nervous system and sense organs: Secondary | ICD-10-CM | POA: Diagnosis not present

## 2015-03-18 DIAGNOSIS — F329 Major depressive disorder, single episode, unspecified: Secondary | ICD-10-CM | POA: Insufficient documentation

## 2015-03-18 LAB — CBC WITH DIFFERENTIAL/PLATELET
BASOS PCT: 0 % (ref 0–1)
Basophils Absolute: 0 10*3/uL (ref 0.0–0.1)
Eosinophils Absolute: 0.2 10*3/uL (ref 0.0–0.7)
Eosinophils Relative: 3 % (ref 0–5)
HEMATOCRIT: 53 % — AB (ref 39.0–52.0)
HEMOGLOBIN: 17.3 g/dL — AB (ref 13.0–17.0)
Lymphocytes Relative: 21 % (ref 12–46)
Lymphs Abs: 1.6 10*3/uL (ref 0.7–4.0)
MCH: 31.1 pg (ref 26.0–34.0)
MCHC: 32.6 g/dL (ref 30.0–36.0)
MCV: 95.3 fL (ref 78.0–100.0)
Monocytes Absolute: 0.5 10*3/uL (ref 0.1–1.0)
Monocytes Relative: 6 % (ref 3–12)
NEUTROS PCT: 70 % (ref 43–77)
Neutro Abs: 5.6 10*3/uL (ref 1.7–7.7)
PLATELETS: 212 10*3/uL (ref 150–400)
RBC: 5.56 MIL/uL (ref 4.22–5.81)
RDW: 14.8 % (ref 11.5–15.5)
WBC: 7.9 10*3/uL (ref 4.0–10.5)

## 2015-03-18 LAB — URINALYSIS, ROUTINE W REFLEX MICROSCOPIC
BILIRUBIN URINE: NEGATIVE
Glucose, UA: NEGATIVE mg/dL
HGB URINE DIPSTICK: NEGATIVE
Ketones, ur: NEGATIVE mg/dL
LEUKOCYTES UA: NEGATIVE
NITRITE: NEGATIVE
Protein, ur: NEGATIVE mg/dL
SPECIFIC GRAVITY, URINE: 1.02 (ref 1.005–1.030)
Urobilinogen, UA: 0.2 mg/dL (ref 0.0–1.0)
pH: 6.5 (ref 5.0–8.0)

## 2015-03-18 LAB — BASIC METABOLIC PANEL
ANION GAP: 9 (ref 5–15)
BUN: 16 mg/dL (ref 6–23)
CALCIUM: 9.1 mg/dL (ref 8.4–10.5)
CO2: 26 mmol/L (ref 19–32)
Chloride: 106 mmol/L (ref 96–112)
Creatinine, Ser: 0.65 mg/dL (ref 0.50–1.35)
GLUCOSE: 90 mg/dL (ref 70–99)
POTASSIUM: 4.3 mmol/L (ref 3.5–5.1)
SODIUM: 141 mmol/L (ref 135–145)

## 2015-03-18 MED ORDER — SODIUM CHLORIDE 0.9 % IV SOLN: 1000.0000 mL | INTRAVENOUS | Status: DC

## 2015-03-18 MED ORDER — SODIUM CHLORIDE 0.9 % IV SOLN
1000.0000 mL | Freq: Once | INTRAVENOUS | Status: AC
  Administered 2015-03-18: 1000 mL via INTRAVENOUS

## 2015-03-18 NOTE — Discharge Instructions (Signed)

## 2015-03-18 NOTE — ED Notes (Signed)
Patient and family verbalize understanding of discharge instructions, home care and follow up care if needed. Patient out of department at this time with family.

## 2015-03-18 NOTE — ED Provider Notes (Signed)
CSN: [id number]     Arrival date & time 03/18/15  1424 History  This chart was scribed for Dorie Rank, MD by Tula Nakayama, ED Scribe. This patient was seen in room [facility]09/[facility]09 and the patient's care was started at 4:29 PM.    Chief Complaint  Patient presents with  . Tachycardia   The history is provided by the patient. No language interpreter was used.   HPI Comments: Ian Hurley is a 49 y.o. male with a history of cerebral palsy and seizures who presents to the Emergency Department complaining of one episode of increased heart rate that occurred earlier today. Pt reports that he was doing exercises at home when his physical therapist noted an elevated, unspecified HR and referred him to the ED. He denies a history of tachycardia. Pt denies fever, chills, dizziness, light-headedness, CP, SOB, diarrhea, vomiting and cough as associated symptoms.  Past Medical History  Diagnosis Date  . Cerebral palsy   . Seizures   . Anxiety   . Depression   . 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 Erosive esophagitis MAY 2015    HB 6.6 MCV 63 FERRITIN 6  . Duodenitis MAY 2015    HB 6.6 MCV 63 FERRITIN 6   Past Surgical History  Procedure Laterality Date  . Esophagogastroduodenoscopy N/A 05/23/2014    Procedure: ESOPHAGOGASTRODUODENOSCOPY (EGD);  Surgeon: Danie Binder, MD;  [facility];  Service: Endoscopy;  Laterality: N/A;  . Flexible sigmoidoscopy N/A 05/23/2014    Procedure: FLEXIBLE SIGMOIDOSCOPY;  Surgeon: Danie Binder, MD;  [facility];  Service: Endoscopy;  Laterality: N/A;  . Colonoscopy N/A 05/24/2014    Procedure: COLONOSCOPY;  Surgeon: Daneil Dolin, MD;  [facility];  Service: Endoscopy;  Laterality: N/A;   No family history on file. History  Substance Use Topics  . Smoking status: Never Smoker   . Smokeless tobacco: Not on file     Comment: Former smoker  . Alcohol Use: No    Review of Systems  Constitutional:  Negative for fever and chills.  Respiratory: Negative for cough and shortness of breath.   Cardiovascular: Negative for chest pain.  Gastrointestinal: Negative for vomiting and diarrhea.  Neurological: Negative for dizziness and light-headedness.  All other systems reviewed and are negative.     Allergies  Review of patient's allergies indicates no known allergies.  Home Medications   Prior to Admission medications   Medication Sig Start Date End Date Taking? Authorizing Provider  cyclobenzaprine (FLEXERIL) 10 MG tablet Take 10 mg by mouth 3 (three) times daily.   Yes Historical Provider, MD  ferrous sulfate 325 (65 FE) MG tablet TAKE 1 TABLET BY MOUTH TWICE DAILY. 12/01/14  Yes Orvil Feil, NP  furosemide (LASIX) 40 MG tablet Take 40 mg by mouth daily.   Yes Historical Provider, MD  liver oil-zinc oxide (BOUDREAUXS BUTT PASTE) 40 % ointment Apply 1 application topically as needed for irritation.   Yes Historical Provider, MD  LORazepam (ATIVAN) 1 MG tablet Take 1 mg by mouth every 8 (eight) hours as needed for anxiety.   Yes Historical Provider, MD  pantoprazole (PROTONIX) 40 MG tablet Take 1 tablet (40 mg total) by mouth 2 (two) times daily before a meal. 05/26/14  Yes Rosita Fire, MD  PHENObarbital (LUMINAL) 30 MG tablet Take 30-60 mg by mouth 2 (two) times daily. 1 tablet in the morning and 2 tablets at bedtime.  Yes Historical Provider, MD  potassium chloride SA (K-DUR,KLOR-CON) 20 MEQ tablet Take 40 mEq by mouth once.   Yes Historical Provider, MD  QUEtiapine (SEROQUEL) 50 MG tablet Take 50 mg by mouth 2 (two) times daily.   Yes Historical Provider, MD  sertraline (ZOLOFT) 100 MG tablet Take 100 mg by mouth daily.   Yes Historical Provider, MD  traMADol (ULTRAM) 50 MG tablet Take 50 mg by mouth 2 (two) times daily as needed for moderate pain.    Yes Historical Provider, MD   BP 146/94 mmHg  Pulse 99  Temp(Src) 98.1 F (36.7 C) (Oral)  Resp 18  SpO2 97% Physical Exam   Constitutional: No distress.  HENT:  Head: Normocephalic and atraumatic.  Right Ear: External ear normal.  Left Ear: External ear normal.  Dry mm  Eyes: Conjunctivae are normal. Right eye exhibits no discharge. Left eye exhibits no discharge. No scleral icterus.  Neck: Neck supple. No tracheal deviation present.  Cardiovascular: Normal rate, regular rhythm and intact distal pulses.   Pulmonary/Chest: Effort normal and breath sounds normal. No stridor. No respiratory distress. He has no wheezes. He has no rales.  Abdominal: Soft. Bowel sounds are normal. He exhibits no distension. There is no tenderness. There is no rebound and no guarding.  Musculoskeletal: He exhibits edema. He exhibits no tenderness.  Neurological: He is alert. He is not disoriented. He displays atrophy. No cranial nerve deficit or sensory deficit. He exhibits abnormal muscle tone. He displays no seizure activity. Coordination abnormal.  Spasticity ue and le, decreased strength   Skin: Skin is warm and dry. No rash noted. He is not diaphoretic.  Psychiatric: He has a normal mood and affect.  Nursing note and vitals reviewed.   ED Course  Procedures   DIAGNOSTIC STUDIES: Oxygen Saturation is 100% on RA, normal by my interpretation.    COORDINATION OF CARE: 4:33 PM Discussed treatment plan with pt at bedside and pt agreed to plan.   Labs Review Labs Reviewed  CBC WITH DIFFERENTIAL/PLATELET - Abnormal; Notable for the following:    Hemoglobin 17.3 (*)    HCT 53.0 (*)    All other components within normal limits  BASIC METABOLIC PANEL  URINALYSIS, ROUTINE W REFLEX MICROSCOPIC   Imaging Review Dg Chest 2 View  03/18/2015   CLINICAL DATA:  Tachycardia, hypertension, cerebral palsy  EXAM: CHEST  2 VIEW  COMPARISON:  Radiograph 325 1,012  FINDINGS: Stable enlarged cardiac silhouette. There are low lung volumes and bilateral pleural effusions. The lateral projection is partially obscured by patient's arms.   IMPRESSION: Cardiomegaly, low lung volumes and small effusions. No overt pulmonary edema or infiltrate.   Electronically Signed   By: Suzy Bouchard M.D.   On: 03/18/2015 19:37      EKG Interpretation   Date/Time:  Friday March 18 2015 16:24:26 EDT Ventricular Rate:  98 PR Interval:  187 QRS Duration: 83 QT Interval:  339 QTC Calculation: 433 R Axis:   55 Text Interpretation:  Sinus rhythm Baseline wander in lead(s) V6 Confirmed  by COOK  MD, BRIAN ([id number]) on 03/18/2015 5:06:27 PM       Medications  0.9 %  sodium chloride infusion (1,000 mLs Intravenous New Bag/Given 03/18/15 1905)    Followed by  0.9 %  sodium chloride infusion (not administered)    MDM   Final diagnoses:  Sinus tachycardia    Pt presents with asymptomatic sinus tachycardia.  LAbs are reassuring.  Pt is not anemic.  Doubt infection.  Bun and cr do not show significant dehydration.  There may be a mild component.  Pt was given IV fluids.  At this time there does not appear to be any evidence of an acute emergency medical condition and the patient appears stable for discharge with appropriate outpatient follow up.   I personally performed the services described in this documentation, which was scribed in my presence.  The recorded information has been reviewed and is accurate.    Dorie Rank, MD 03/18/15 2007

## 2015-03-18 NOTE — ED Notes (Signed)
Pt O2 sat 99%. Incorrect reading on monitor when vitals validated.

## 2015-03-18 NOTE — ED Notes (Signed)
Physical therapist took patients HR at home and told him it was high.  Denies dizziness or lightheadedness, no chest pain.

## 2015-03-22 DIAGNOSIS — M24521 Contracture, right elbow: Secondary | ICD-10-CM | POA: Diagnosis not present

## 2015-03-22 DIAGNOSIS — Q898 Other specified congenital malformations: Secondary | ICD-10-CM | POA: Diagnosis not present

## 2015-03-22 DIAGNOSIS — M24562 Contracture, left knee: Secondary | ICD-10-CM | POA: Diagnosis not present

## 2015-03-22 DIAGNOSIS — M24561 Contracture, right knee: Secondary | ICD-10-CM | POA: Diagnosis not present

## 2015-03-22 DIAGNOSIS — G809 Cerebral palsy, unspecified: Secondary | ICD-10-CM | POA: Diagnosis not present

## 2015-03-22 DIAGNOSIS — I10 Essential (primary) hypertension: Secondary | ICD-10-CM | POA: Diagnosis not present

## 2015-03-24 DIAGNOSIS — Q898 Other specified congenital malformations: Secondary | ICD-10-CM | POA: Diagnosis not present

## 2015-03-24 DIAGNOSIS — M24521 Contracture, right elbow: Secondary | ICD-10-CM | POA: Diagnosis not present

## 2015-03-24 DIAGNOSIS — I10 Essential (primary) hypertension: Secondary | ICD-10-CM | POA: Diagnosis not present

## 2015-03-24 DIAGNOSIS — M24562 Contracture, left knee: Secondary | ICD-10-CM | POA: Diagnosis not present

## 2015-03-24 DIAGNOSIS — G809 Cerebral palsy, unspecified: Secondary | ICD-10-CM | POA: Diagnosis not present

## 2015-03-24 DIAGNOSIS — M24561 Contracture, right knee: Secondary | ICD-10-CM | POA: Diagnosis not present

## 2015-03-30 DIAGNOSIS — M24561 Contracture, right knee: Secondary | ICD-10-CM | POA: Diagnosis not present

## 2015-03-30 DIAGNOSIS — I10 Essential (primary) hypertension: Secondary | ICD-10-CM | POA: Diagnosis not present

## 2015-03-30 DIAGNOSIS — M24562 Contracture, left knee: Secondary | ICD-10-CM | POA: Diagnosis not present

## 2015-03-30 DIAGNOSIS — M24521 Contracture, right elbow: Secondary | ICD-10-CM | POA: Diagnosis not present

## 2015-03-30 DIAGNOSIS — G809 Cerebral palsy, unspecified: Secondary | ICD-10-CM | POA: Diagnosis not present

## 2015-03-30 DIAGNOSIS — Q898 Other specified congenital malformations: Secondary | ICD-10-CM | POA: Diagnosis not present

## 2015-04-08 ENCOUNTER — Encounter (HOSPITAL_COMMUNITY): Payer: Self-pay

## 2015-04-08 ENCOUNTER — Emergency Department (HOSPITAL_COMMUNITY)
Admission: EM | Admit: 2015-04-08 | Discharge: 2015-04-08 | Disposition: A | Discharge status: Home / Self Care | Payer: Medicare Other | Attending: Emergency Medicine | Admitting: Emergency Medicine

## 2015-04-08 DIAGNOSIS — G40909 Epilepsy, unspecified, not intractable, without status epilepticus: Secondary | ICD-10-CM | POA: Diagnosis not present

## 2015-04-08 DIAGNOSIS — F419 Anxiety disorder, unspecified: Secondary | ICD-10-CM | POA: Insufficient documentation

## 2015-04-08 DIAGNOSIS — F329 Major depressive disorder, single episode, unspecified: Secondary | ICD-10-CM | POA: Diagnosis not present

## 2015-04-08 DIAGNOSIS — Z79899 Other long term (current) drug therapy: Secondary | ICD-10-CM | POA: Diagnosis not present

## 2015-04-08 DIAGNOSIS — R000 Tachycardia, unspecified: Secondary | ICD-10-CM | POA: Diagnosis not present

## 2015-04-08 DIAGNOSIS — Z8719 Personal history of other diseases of the digestive system: Secondary | ICD-10-CM | POA: Diagnosis not present

## 2015-04-08 LAB — BASIC METABOLIC PANEL
Anion gap: 11 (ref 5–15)
BUN: 14 mg/dL (ref 6–23)
CO2: 24 mmol/L (ref 19–32)
Calcium: 8.8 mg/dL (ref 8.4–10.5)
Chloride: 102 mmol/L (ref 96–112)
Creatinine, Ser: 0.92 mg/dL (ref 0.50–1.35)
GFR calc Af Amer: >90 mL/min (ref >90)
GFR calc non Af Amer: >90 mL/min (ref >90)
Glucose, Bld: 93 mg/dL (ref 70–99)
Potassium: 4.1 mmol/L (ref 3.5–5.1)
Sodium: 137 mmol/L (ref 135–145)

## 2015-04-08 LAB — CBC WITH DIFFERENTIAL/PLATELET
Basophils Absolute: 0 10*3/uL (ref 0.0–0.1)
Basophils Relative: 1 % (ref 0–1)
Eosinophils Absolute: 0.2 10*3/uL (ref 0.0–0.7)
Eosinophils Relative: 2 % (ref 0–5)
HCT: 57.3 % — ABNORMAL HIGH (ref 39.0–52.0)
Hemoglobin: 19.1 g/dL — ABNORMAL HIGH (ref 13.0–17.0)
Lymphocytes Relative: 15 % (ref 12–46)
Lymphs Abs: 0.9 10*3/uL (ref 0.7–4.0)
MCH: 31.2 pg (ref 26.0–34.0)
MCHC: 33.3 g/dL (ref 30.0–36.0)
MCV: 93.5 fL (ref 78.0–100.0)
Monocytes Absolute: 0.6 10*3/uL (ref 0.1–1.0)
Monocytes Relative: 10 % (ref 3–12)
Neutro Abs: 4.5 10*3/uL (ref 1.7–7.7)
Neutrophils Relative %: 72 % (ref 43–77)
Platelets: 216 10*3/uL (ref 150–400)
RBC: 6.13 MIL/uL — ABNORMAL HIGH (ref 4.22–5.81)
RDW: 14.7 % (ref 11.5–15.5)
WBC: 6.2 10*3/uL (ref 4.0–10.5)

## 2015-04-08 MED ORDER — SODIUM CHLORIDE 0.9 % IV BOLUS (SEPSIS)
1000.0000 mL | Freq: Once | INTRAVENOUS | Status: AC
  Administered 2015-04-08: 1000 mL via INTRAVENOUS

## 2015-04-08 NOTE — ED Provider Notes (Signed)
CSN: [id number]     Arrival date & time 04/08/15  1433 History   First MD Initiated Contact with Patient 04/08/15 1448     Chief Complaint  Patient presents with  . Tachycardia     (Consider location/radiation/quality/duration/timing/severity/associated sxs/prior Treatment) HPI  49 year old male presents from [place] with tachycardia. The home health nurse came and listened to his heart and notices heart rate was around 120. The patient is otherwise feeling asymptomatic. Had a similar presentation on 3/18. Denies feeling palpitations. Denies chest pain, shortness of breath, vomiting, diarrhea, fever, or urinary symptoms. The staff at the bedside notes that patient and his girlfriend broke up a few days ago. The patient has been sad and tearful during this time. On the ride over here to the emergency department he almost started to cry after they were talking about it. She thinks this is related to his acute grief.  Past Medical History  Diagnosis Date  . Cerebral palsy   . Seizures   . Anxiety   . Depression   . 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 Erosive esophagitis MAY 2015    HB 6.6 MCV 63 FERRITIN 6  . Duodenitis MAY 2015    HB 6.6 MCV 63 FERRITIN 6   Past Surgical History  Procedure Laterality Date  . Esophagogastroduodenoscopy N/A 05/23/2014    Procedure: ESOPHAGOGASTRODUODENOSCOPY (EGD);  Surgeon: Danie Binder, MD;  [facility];  Service: Endoscopy;  Laterality: N/A;  . Flexible sigmoidoscopy N/A 05/23/2014    Procedure: FLEXIBLE SIGMOIDOSCOPY;  Surgeon: Danie Binder, MD;  [facility];  Service: Endoscopy;  Laterality: N/A;  . Colonoscopy N/A 05/24/2014    Procedure: COLONOSCOPY;  Surgeon: Daneil Dolin, MD;  [facility];  Service: Endoscopy;  Laterality: N/A;   No family history on file. History  Substance Use Topics  . Smoking status: Never Smoker   . Smokeless tobacco: Not on file     Comment:  Former smoker  . Alcohol Use: No    Review of Systems  Constitutional: Negative for fever.  Respiratory: Negative for shortness of breath.   Cardiovascular: Negative for chest pain and palpitations.  Gastrointestinal: Negative for vomiting and diarrhea.  Genitourinary: Negative for dysuria.  All other systems reviewed and are negative.     Allergies  Review of patient's allergies indicates no known allergies.  Home Medications   Prior to Admission medications   Medication Sig Start Date End Date Taking? Authorizing Provider  cyclobenzaprine (FLEXERIL) 10 MG tablet Take 10 mg by mouth 3 (three) times daily.    Historical Provider, MD  ferrous sulfate 325 (65 FE) MG tablet TAKE 1 TABLET BY MOUTH TWICE DAILY. 12/01/14   Orvil Feil, NP  furosemide (LASIX) 40 MG tablet Take 40 mg by mouth daily.    Historical Provider, MD  liver oil-zinc oxide (BOUDREAUXS BUTT PASTE) 40 % ointment Apply 1 application topically as needed for irritation.    Historical Provider, MD  LORazepam (ATIVAN) 1 MG tablet Take 1 mg by mouth every 8 (eight) hours as needed for anxiety.    Historical Provider, MD  pantoprazole (PROTONIX) 40 MG tablet Take 1 tablet (40 mg total) by mouth 2 (two) times daily before a meal. 05/26/14   Rosita Fire, MD  PHENObarbital (LUMINAL) 30 MG tablet Take 30-60 mg by mouth 2 (two) times daily. 1 tablet in the morning and 2 tablets at bedtime.  Historical Provider, MD  potassium chloride SA (K-DUR,KLOR-CON) 20 MEQ tablet Take 40 mEq by mouth once.    Historical Provider, MD  QUEtiapine (SEROQUEL) 50 MG tablet Take 50 mg by mouth 2 (two) times daily.    Historical Provider, MD  sertraline (ZOLOFT) 100 MG tablet Take 100 mg by mouth daily.    Historical Provider, MD  traMADol (ULTRAM) 50 MG tablet Take 50 mg by mouth 2 (two) times daily as needed for moderate pain.     Historical Provider, MD   BP 126/102 mmHg  Pulse 118  Temp(Src) 97.5 F (36.4 C) (Oral)  Resp 19  SpO2  100% Physical Exam  Constitutional: He is oriented to person, place, and time. He appears well-developed and well-nourished.  HENT:  Head: Normocephalic and atraumatic.  Right Ear: External ear normal.  Left Ear: External ear normal.  Nose: Nose normal.  Dry mucous membranes  Eyes: Right eye exhibits no discharge. Left eye exhibits no discharge.  Neck: Neck supple.  Cardiovascular: Regular rhythm, normal heart sounds and intact distal pulses.  Tachycardia present.   No murmur heard. Pulmonary/Chest: Effort normal and breath sounds normal.  Abdominal: Soft. He exhibits no distension. There is no tenderness.  Musculoskeletal: He exhibits no edema.  Neurological: He is alert and oriented to person, place, and time.  Spasticity in the upper and lower extremities with contractures  Skin: Skin is warm and dry.  Nursing note and vitals reviewed.   ED Course  Procedures (including critical care time) Labs Review Labs Reviewed  CBC WITH DIFFERENTIAL/PLATELET - Abnormal; Notable for the following:    RBC 6.13 (*)    Hemoglobin 19.1 (*)    HCT 57.3 (*)    All other components within normal limits  BASIC METABOLIC PANEL    Imaging Review No results found.   EKG Interpretation   Date/Time:  Friday April 08 2015 14:49:01 EDT Ventricular Rate:  117 PR Interval:  166 QRS Duration: 85 QT Interval:  329 QTC Calculation: 459 R Axis:   47 Text Interpretation:  Sinus tachycardia Probable left atrial enlargement  rate is faster than Mar 2016, otherwise no significant change Confirmed by  Kristelle Cavallaro  MD, Jenefer Woerner ([id number]) on 04/08/2015 3:00:18 PM      MDM   Final diagnoses:  Sinus tachycardia    Patient's tachycardia is likely related to grief after having been broken up with his girlfriend. Heart rate trended down in the ER after fluids and rest. His tachycardia is a sinus tachycardia. Given fluids. Very minimal increase in his creatinine as well as increase in his hemoglobin is likely  related to some form of dehydration. Does not warrant admission given he is able to tolerate fluids. Stable for discharge.    Sherwood Gambler, MD 04/08/15 2211

## 2015-04-08 NOTE — ED Notes (Signed)
Patient with no complaints at this time. Respirations even and unlabored. Skin warm/dry. Discharge instructions reviewed with patient at this time. Patient given opportunity to voice concerns/ask questions. IV removed per policy and band-aid applied to site. Patient discharged at this time and left Emergency Department with steady gait.  

## 2015-04-08 NOTE — ED Notes (Signed)
Pt resident of [facility].  Staff reports home health RN noticed his HR was 120 so she sent him here for eval.  Pt says this has been an ongoing problem, reports has been having thyroid checked.  Pt denies pain.  Denies SOB.  Reports was here recently for htn.

## 2015-04-08 NOTE — ED Notes (Signed)
Caregiver had to run some errands.  Phone Number:  [phone]

## 2015-04-19 DIAGNOSIS — G809 Cerebral palsy, unspecified: Secondary | ICD-10-CM | POA: Diagnosis not present

## 2015-04-19 DIAGNOSIS — G40909 Epilepsy, unspecified, not intractable, without status epilepticus: Secondary | ICD-10-CM | POA: Diagnosis not present

## 2015-04-19 DIAGNOSIS — R000 Tachycardia, unspecified: Secondary | ICD-10-CM | POA: Diagnosis not present

## 2015-05-09 ENCOUNTER — Ambulatory Visit (HOSPITAL_COMMUNITY): Payer: Medicare Other | Admitting: Hematology & Oncology

## 2015-05-15 NOTE — Progress Notes (Signed)
This encounter was created in error - please disregard.

## 2015-05-17 ENCOUNTER — Encounter (HOSPITAL_COMMUNITY): Payer: Medicare Other | Attending: Oncology | Admitting: Oncology

## 2015-05-17 ENCOUNTER — Encounter (HOSPITAL_COMMUNITY): Payer: Self-pay | Admitting: Oncology

## 2015-05-17 VITALS — BP 133/89 | HR 94 | Temp 98.4°F | Resp 18

## 2015-05-17 DIAGNOSIS — Z862 Personal history of diseases of the blood and blood-forming organs and certain disorders involving the immune mechanism: Secondary | ICD-10-CM | POA: Diagnosis not present

## 2015-05-17 DIAGNOSIS — G809 Cerebral palsy, unspecified: Secondary | ICD-10-CM | POA: Insufficient documentation

## 2015-05-17 DIAGNOSIS — E611 Iron deficiency: Secondary | ICD-10-CM | POA: Insufficient documentation

## 2015-05-17 DIAGNOSIS — D751 Secondary polycythemia: Secondary | ICD-10-CM | POA: Diagnosis not present

## 2015-05-17 DIAGNOSIS — F329 Major depressive disorder, single episode, unspecified: Secondary | ICD-10-CM | POA: Insufficient documentation

## 2015-05-17 DIAGNOSIS — G40909 Epilepsy, unspecified, not intractable, without status epilepticus: Secondary | ICD-10-CM | POA: Diagnosis not present

## 2015-05-17 HISTORY — DX: Secondary polycythemia: D75.1

## 2015-05-17 NOTE — Progress Notes (Addendum)
[facility] Hematology/Oncology Consultation   Name: Ian Hurley      MRN: [id number]    Location: Room/bed info not found  Date: 05/17/2015 Time:9:05 AM   REFERRING PHYSICIAN:  Phillips Odor, MD  REASON FOR CONSULT:  Elevated hemoglobin   DIAGNOSIS:  Elevated hemoglobin with normal WBC and platelet count.  HISTORY OF PRESENT ILLNESS:   Ian Hurley is a 49 year old black American man with a past medical history significant for seizure disorder, depression, cerebral palsy, and anxiety who was referred to the [facility] for elevated hemoglobin and HCT.  Chart reviewed.  In May 2015, the patient was admitted to the [facility] for severe iron deficiency anemia.  He was started on ferrous sulfate.  He was seeing Dr. Merlene Laughter and he noted an elevated Hgb.  Therefore, the patient was referred to [facility].   He denies any complaints today and is surprised that his Hgb is elevated.   I personally reviewed and went over radiographic studies with the patient.  The results are noted within this dictation.    I personally reviewed and went over laboratory results with the patient.  The results are noted within this dictation.  He reports that he smoked as a teenager, but quantity and for how many years if difficult to quantify due to the patient's history.  He notes that he started to smoke when his brother went to [place].  He denies snoring at night.  He reports that he feels well rested upon awakening.  He denies any daytime napping.  Hematologically, he denies any complaints and ROS questioning is negative.  PAST MEDICAL HISTORY:   Past Medical History  Diagnosis Date  . Cerebral palsy   . Seizures   . Anxiety   . Depression   . 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 Erosive esophagitis MAY 2015    HB 6.6 MCV 63 FERRITIN 6  . Duodenitis MAY 2015    HB 6.6 MCV 63 FERRITIN 6    ALLERGIES: No Known Allergies    MEDICATIONS: I have  reviewed the patient's current medications.    Current Outpatient Prescriptions on File Prior to Visit  Medication Sig Dispense Refill  . cyclobenzaprine (FLEXERIL) 10 MG tablet Take 10 mg by mouth 3 (three) times daily.    . ferrous sulfate 325 (65 FE) MG tablet TAKE 1 TABLET BY MOUTH TWICE DAILY. 60 tablet 5  . furosemide (LASIX) 40 MG tablet Take 40 mg by mouth daily.    Marland Kitchen liver oil-zinc oxide (BOUDREAUXS BUTT PASTE) 40 % ointment Apply 1 application topically as needed for irritation.    Marland Kitchen LORazepam (ATIVAN) 1 MG tablet Take 1 mg by mouth every 8 (eight) hours as needed for anxiety.    . pantoprazole (PROTONIX) 40 MG tablet Take 1 tablet (40 mg total) by mouth 2 (two) times daily before a meal. 60 tablet 3  . PHENObarbital (LUMINAL) 30 MG tablet Take 30-60 mg by mouth 2 (two) times daily. 1 tablet in the morning and 2 tablets at bedtime.    . potassium chloride SA (K-DUR,KLOR-CON) 20 MEQ tablet Take 40 mEq by mouth daily.     . QUEtiapine (SEROQUEL) 50 MG tablet Take 50 mg by mouth 2 (two) times daily.    . sertraline (ZOLOFT) 100 MG tablet Take 100 mg by mouth daily.    . traMADol (ULTRAM) 50 MG tablet Take 50  mg by mouth 2 (two) times daily as needed for moderate pain.      No current facility-administered medications on file prior to visit.     PAST SURGICAL HISTORY Past Surgical History  Procedure Laterality Date  . Esophagogastroduodenoscopy N/A 05/23/2014    Procedure: ESOPHAGOGASTRODUODENOSCOPY (EGD);  Surgeon: Danie Binder, MD;  [facility];  Service: Endoscopy;  Laterality: N/A;  . Flexible sigmoidoscopy N/A 05/23/2014    Procedure: FLEXIBLE SIGMOIDOSCOPY;  Surgeon: Danie Binder, MD;  [facility];  Service: Endoscopy;  Laterality: N/A;  . Colonoscopy N/A 05/24/2014    Procedure: COLONOSCOPY;  Surgeon: Daneil Dolin, MD;  [facility];  Service: Endoscopy;  Laterality: N/A;    FAMILY HISTORY: No family history on file.  SOCIAL  HISTORY:  reports that he has never smoked. He does not have any smokeless tobacco history on file. He reports that he does not drink alcohol or use illicit drugs.  PERFORMANCE STATUS: The patient's performance status is 3 - Symptomatic, >50% confined to bed  PHYSICAL EXAM: Most Recent Vital Signs: There were no vitals taken for this visit. General appearance: alert, cooperative, no distress and moderately obese Head: Normocephalic, without obvious abnormality, atraumatic Eyes: negative findings: lids and lashes normal, conjunctivae and sclerae normal and corneas clear Throat: lips, mucosa, and tongue normal; teeth and gums normal Neck: no adenopathy and supple, symmetrical, trachea midline Lungs: clear to auscultation bilaterally Heart: regular rate and rhythm Abdomen: soft, non-tender; bowel sounds normal; no masses,  no organomegaly Extremities: extremities normal, atraumatic, no cyanosis or edema and contracture of right upper extremity and hand contractures, positive clubbing of fingernails. Skin: Skin color, texture, turgor normal. No rashes or lesions Lymph nodes: Cervical, supraclavicular, and axillary nodes normal. Neurologic: A and O x 3.  Unable to walk.  Signs of cerebral palsy  LABORATORY DATA:   CBC    Component Value Date/Time   WBC 6.2 04/08/2015 1553   RBC 6.13* 04/08/2015 1553   HGB 19.1* 04/08/2015 1553   HCT 57.3* 04/08/2015 1553   PLT 216 04/08/2015 1553   MCV 93.5 04/08/2015 1553   MCH 31.2 04/08/2015 1553   MCHC 33.3 04/08/2015 1553   RDW 14.7 04/08/2015 1553   LYMPHSABS 0.9 04/08/2015 1553   MONOABS 0.6 04/08/2015 1553   EOSABS 0.2 04/08/2015 1553   BASOSABS 0.0 04/08/2015 1553     RADIOGRAPHY:  CLINICAL DATA: Tachycardia, hypertension, cerebral palsy  EXAM: CHEST 2 VIEW  COMPARISON: Radiograph 325 1,012  FINDINGS: Stable enlarged cardiac silhouette. There are low lung volumes and bilateral pleural effusions. The lateral projection is  partially obscured by patient's arms.  IMPRESSION: Cardiomegaly, low lung volumes and small effusions. No overt pulmonary edema or infiltrate.   Electronically Signed  By: Suzy Bouchard M.D.  On: 03/18/2015 19:37    ASSESSMENT:  1. Polycythemia 2. Cerebral Palsy 3. H/O severe iron deficiency anemia. 4. Seizure disorder.   PLAN:  1. I personally reviewed and went over laboratory results with the patient.  The results are noted within this dictation. 2. I personally reviewed and went over radiographic studies with the patient.  The results are noted within this dictation.   3. Chart reviewed. 4. Labs today: CBC diff, CMET, EPO, iron/TIBC, ferritin, Vit B12, TSH, JAK2 5. If labs are negative, may need to consider ABG and/or PFT. 6. Return in 4 weeks for follow-up.  All questions were answered. The patient knows to call the clinic with any problems, questions  or concerns. We can certainly see the patient much sooner if necessary.  Patient and plan discussed with Dr. Ancil Linsey and she is in agreement with the aforementioned.   This note is electronically signed by: Robynn Pane 05/17/2015 9:05 AM  Patient seen and examined. Details are above. Patient noted to have polycythemia but no symptoms suggestive of P.Vera. Will check labs as detailed above, including erythropoietin, JAK 2 etc. He has no known hypopoxemia, he is not a smoker. Advised patient we will regroup after lab results are back and make additional recommendations at that time. Donald Pore MD

## 2015-05-17 NOTE — Patient Instructions (Signed)
[place] at [facility] Discharge Instructions  RECOMMENDATIONS MADE BY THE CONSULTANT AND ANY TEST RESULTS WILL BE SENT TO YOUR REFERRING PHYSICIAN.  Exam and discussion by Robynn Pane, PA-C and Dr. Whitney Muse. Will check some labs today and will see you back in 4 weeks to discuss test results.  Thank you for choosing [place] at [facility] to provide your oncology and hematology care.  To afford each patient quality time with our provider, please arrive at least 15 minutes before your scheduled appointment time.    You need to re-schedule your appointment should you arrive 10 or more minutes late.  We strive to give you quality time with our providers, and arriving late affects you and other patients whose appointments are after yours.  Also, if you no show three or more times for appointments you may be dismissed from the clinic at the providers discretion.     Again, thank you for choosing [facility].  Our hope is that these requests will decrease the amount of time that you wait before being seen by our physicians.       _____________________________________________________________  Should you have questions after your visit to [facility], please contact our office at [phone] between the hours of 8:30 a.m. and 4:30 p.m.  Voicemails left after 4:30 p.m. will not be returned until the following business day.  For prescription refill requests, have your pharmacy contact our office.

## 2015-05-20 ENCOUNTER — Encounter (HOSPITAL_BASED_OUTPATIENT_CLINIC_OR_DEPARTMENT_OTHER): Payer: Medicare Other

## 2015-05-20 DIAGNOSIS — D751 Secondary polycythemia: Secondary | ICD-10-CM

## 2015-05-20 DIAGNOSIS — G40909 Epilepsy, unspecified, not intractable, without status epilepticus: Secondary | ICD-10-CM | POA: Diagnosis not present

## 2015-05-20 DIAGNOSIS — G809 Cerebral palsy, unspecified: Secondary | ICD-10-CM | POA: Diagnosis not present

## 2015-05-20 DIAGNOSIS — E611 Iron deficiency: Secondary | ICD-10-CM | POA: Diagnosis not present

## 2015-05-20 LAB — COMPREHENSIVE METABOLIC PANEL
ALBUMIN: 4.1 g/dL (ref 3.5–5.0)
ALT: 16 U/L — ABNORMAL LOW (ref 17–63)
AST: 15 U/L (ref 15–41)
Alkaline Phosphatase: 101 U/L (ref 38–126)
Anion gap: 8 (ref 5–15)
BUN: 12 mg/dL (ref 6–20)
CALCIUM: 8.7 mg/dL — AB (ref 8.9–10.3)
CO2: 30 mmol/L (ref 22–32)
Chloride: 99 mmol/L — ABNORMAL LOW (ref 101–111)
Creatinine, Ser: 0.5 mg/dL — ABNORMAL LOW (ref 0.61–1.24)
GFR calc Af Amer: >60 mL/min (ref >60)
Glucose, Bld: 88 mg/dL (ref 65–99)
Potassium: 3.8 mmol/L (ref 3.5–5.1)
SODIUM: 137 mmol/L (ref 135–145)
TOTAL PROTEIN: 8.4 g/dL — AB (ref 6.5–8.1)
Total Bilirubin: 0.5 mg/dL (ref 0.3–1.2)

## 2015-05-20 LAB — CBC WITH DIFFERENTIAL/PLATELET
Basophils Absolute: 0 10*3/uL (ref 0.0–0.1)
Basophils Relative: 0 % (ref 0–1)
EOS PCT: 5 % (ref 0–5)
Eosinophils Absolute: 0.3 10*3/uL (ref 0.0–0.7)
HCT: 54.6 % — ABNORMAL HIGH (ref 39.0–52.0)
Hemoglobin: 18.2 g/dL — ABNORMAL HIGH (ref 13.0–17.0)
LYMPHS ABS: 1.5 10*3/uL (ref 0.7–4.0)
LYMPHS PCT: 23 % (ref 12–46)
MCH: 31.1 pg (ref 26.0–34.0)
MCHC: 33.3 g/dL (ref 30.0–36.0)
MCV: 93.2 fL (ref 78.0–100.0)
MONO ABS: 0.4 10*3/uL (ref 0.1–1.0)
Monocytes Relative: 7 % (ref 3–12)
Neutro Abs: 4.1 10*3/uL (ref 1.7–7.7)
Neutrophils Relative %: 65 % (ref 43–77)
Platelets: 206 10*3/uL (ref 150–400)
RBC: 5.86 MIL/uL — AB (ref 4.22–5.81)
RDW: 14.5 % (ref 11.5–15.5)
WBC: 6.2 10*3/uL (ref 4.0–10.5)

## 2015-05-20 LAB — IRON AND TIBC
IRON: 70 ug/dL (ref 45–182)
Saturation Ratios: 24 % (ref 17.9–39.5)
TIBC: 295 ug/dL (ref 250–450)
UIBC: 225 ug/dL

## 2015-05-20 LAB — TSH: TSH: 2.159 u[IU]/mL (ref 0.350–4.500)

## 2015-05-20 LAB — FERRITIN: Ferritin: 57 ng/mL (ref 24–336)

## 2015-05-20 LAB — VITAMIN B12: Vitamin B-12: 462 pg/mL (ref 180–914)

## 2015-05-20 NOTE — Progress Notes (Signed)
Labs drawn

## 2015-05-21 LAB — ERYTHROPOIETIN: Erythropoietin: 33.5 m[IU]/mL — ABNORMAL HIGH (ref 2.6–18.5)

## 2015-05-23 DIAGNOSIS — G809 Cerebral palsy, unspecified: Secondary | ICD-10-CM | POA: Diagnosis not present

## 2015-05-23 DIAGNOSIS — F419 Anxiety disorder, unspecified: Secondary | ICD-10-CM | POA: Diagnosis not present

## 2015-05-27 ENCOUNTER — Other Ambulatory Visit: Payer: Self-pay | Admitting: Gastroenterology

## 2015-05-27 LAB — MISCELLANEOUS TEST

## 2015-05-31 DIAGNOSIS — G809 Cerebral palsy, unspecified: Secondary | ICD-10-CM | POA: Diagnosis not present

## 2015-05-31 DIAGNOSIS — D45 Polycythemia vera: Secondary | ICD-10-CM | POA: Diagnosis not present

## 2015-05-31 DIAGNOSIS — G40909 Epilepsy, unspecified, not intractable, without status epilepticus: Secondary | ICD-10-CM | POA: Diagnosis not present

## 2015-05-31 DIAGNOSIS — R000 Tachycardia, unspecified: Secondary | ICD-10-CM | POA: Diagnosis not present

## 2015-06-02 LAB — MISCELLANEOUS TEST

## 2015-06-20 ENCOUNTER — Encounter (HOSPITAL_COMMUNITY): Payer: Medicare Other | Attending: Oncology | Admitting: Hematology & Oncology

## 2015-06-20 ENCOUNTER — Encounter (HOSPITAL_COMMUNITY): Payer: Self-pay | Admitting: Hematology & Oncology

## 2015-06-20 VITALS — BP 135/88 | HR 108 | Temp 98.2°F | Resp 22

## 2015-06-20 DIAGNOSIS — D509 Iron deficiency anemia, unspecified: Secondary | ICD-10-CM

## 2015-06-20 DIAGNOSIS — F329 Major depressive disorder, single episode, unspecified: Secondary | ICD-10-CM | POA: Insufficient documentation

## 2015-06-20 DIAGNOSIS — G809 Cerebral palsy, unspecified: Secondary | ICD-10-CM | POA: Diagnosis not present

## 2015-06-20 DIAGNOSIS — G4089 Other seizures: Secondary | ICD-10-CM

## 2015-06-20 DIAGNOSIS — E611 Iron deficiency: Secondary | ICD-10-CM | POA: Insufficient documentation

## 2015-06-20 DIAGNOSIS — D751 Secondary polycythemia: Secondary | ICD-10-CM | POA: Insufficient documentation

## 2015-06-20 DIAGNOSIS — G40909 Epilepsy, unspecified, not intractable, without status epilepticus: Secondary | ICD-10-CM | POA: Insufficient documentation

## 2015-06-20 NOTE — Patient Instructions (Signed)
[place] at [facility] Discharge Instructions  RECOMMENDATIONS MADE BY THE CONSULTANT AND ANY TEST RESULTS WILL BE SENT TO YOUR REFERRING PHYSICIAN.  You saw Dr. Whitney Muse today. She ordered a CT Scan of the body. Follow up in two weeks for a doctor visit. Call for concerns and questions.  Thank you for choosing [place] at [facility] to provide your oncology and hematology care.  To afford each patient quality time with our provider, please arrive at least 15 minutes before your scheduled appointment time.    You need to re-schedule your appointment should you arrive 10 or more minutes late.  We strive to give you quality time with our providers, and arriving late affects you and other patients whose appointments are after yours.  Also, if you no show three or more times for appointments you may be dismissed from the clinic at the providers discretion.     Again, thank you for choosing [facility].  Our hope is that these requests will decrease the amount of time that you wait before being seen by our physicians.       _____________________________________________________________  Should you have questions after your visit to [facility], please contact our office at [phone] between the hours of 8:30 a.m. and 4:30 p.m.  Voicemails left after 4:30 p.m. will not be returned until the following business day.  For prescription refill requests, have your pharmacy contact our office.

## 2015-06-20 NOTE — Progress Notes (Signed)
[facility] Hematology/Oncology Consultation   Name: Ian Hurley      MRN: [id number]    Location: Room/bed info not found  Date: 06/20/2015 Time:9:48 AM   REFERRING PHYSICIAN:  Phillips Odor, MD  REASON FOR CONSULT:  Elevated hemoglobin   DIAGNOSIS:  Elevated hemoglobin with normal WBC and platelet count.  HISTORY OF PRESENT ILLNESS:   Ian Hurley is a 49 year old black American man with a past medical history significant for seizure disorder, depression, cerebral palsy, and anxiety who was referred to the [facility] for elevated hemoglobin and HCT.  Chart reviewed.  In May 2015, the patient was admitted to the [facility] for severe iron deficiency anemia.  He was started on ferrous sulfate.  He was seeing Dr. Merlene Laughter and he noted an elevated Hgb.  Therefore, the patient was referred to [facility].   He is present today with one family member.    He denies snoring, waking up choking, and sleep apnea.  He has never had a sleep study done before. He says that he has been eating well.  He is here today to review laboratory studies and for additional treatment recommendations.  PAST MEDICAL HISTORY:   Past Medical History  Diagnosis Date  . Cerebral palsy   . Seizures   . Anxiety   . Depression   . 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 Erosive esophagitis MAY 2015    HB 6.6 MCV 63 FERRITIN 6  . Duodenitis MAY 2015    HB 6.6 MCV 63 FERRITIN 6  . Polycythemia 05/17/2015    ALLERGIES: No Known Allergies    MEDICATIONS: I have reviewed the patient's current medications.    Current Outpatient Prescriptions on File Prior to Visit  Medication Sig Dispense Refill  . cyclobenzaprine (FLEXERIL) 10 MG tablet Take 10 mg by mouth 3 (three) times daily.    . ferrous sulfate 325 (65 FE) MG tablet TAKE 1 TABLET BY MOUTH TWICE DAILY. 60 tablet 3  . furosemide (LASIX) 40 MG tablet Take 40 mg by mouth daily.    Marland Kitchen liver oil-zinc oxide  (BOUDREAUXS BUTT PASTE) 40 % ointment Apply 1 application topically as needed for irritation.    Marland Kitchen LORazepam (ATIVAN) 1 MG tablet Take 1 mg by mouth every 8 (eight) hours as needed for anxiety.    . pantoprazole (PROTONIX) 40 MG tablet Take 1 tablet (40 mg total) by mouth 2 (two) times daily before a meal. 60 tablet 3  . PHENObarbital (LUMINAL) 30 MG tablet Take 30-60 mg by mouth 2 (two) times daily. 1 tablet in the morning and 2 tablets at bedtime.    . potassium chloride SA (K-DUR,KLOR-CON) 20 MEQ tablet Take 40 mEq by mouth daily.     . QUEtiapine (SEROQUEL) 50 MG tablet Take 50 mg by mouth 2 (two) times daily.    . sertraline (ZOLOFT) 100 MG tablet Take 100 mg by mouth daily.    . traMADol (ULTRAM) 50 MG tablet Take 50 mg by mouth 2 (two) times daily as needed for moderate pain.      No current facility-administered medications on file prior to visit.     PAST SURGICAL HISTORY Past Surgical History  Procedure Laterality Date  . Esophagogastroduodenoscopy N/A 05/23/2014    Procedure: ESOPHAGOGASTRODUODENOSCOPY (EGD);  Surgeon: Danie Binder, MD;  [facility];  Service: Endoscopy;  Laterality: N/A;  . Flexible  sigmoidoscopy N/A 05/23/2014    Procedure: FLEXIBLE SIGMOIDOSCOPY;  Surgeon: Danie Binder, MD;  [facility];  Service: Endoscopy;  Laterality: N/A;  . Colonoscopy N/A 05/24/2014    Procedure: COLONOSCOPY;  Surgeon: Daneil Dolin, MD;  [facility];  Service: Endoscopy;  Laterality: N/A;    FAMILY HISTORY: No family history on file.  SOCIAL HISTORY:  reports that he quit smoking about 17 months ago. His smoking use included Cigarettes. He does not have any smokeless tobacco history on file. He reports that he does not drink alcohol or use illicit drugs.  PERFORMANCE STATUS: The patient's performance status is 3 - Symptomatic, >50% confined to bed    PHYSICAL EXAM: Most Recent Vital Signs: There were no vitals taken for this visit. General  appearance: alert, cooperative, no distress and moderately obese Head: Normocephalic, without obvious abnormality, atraumatic Eyes: negative findings: lids and lashes normal, conjunctivae and sclerae normal and corneas clear Throat: lips, mucosa, and tongue normal; teeth and gums normal Neck: no adenopathy and supple, symmetrical, trachea midline Lungs: clear to auscultation bilaterally Heart: regular rate and rhythm Abdomen: soft, non-tender; bowel sounds normal; no masses,  no organomegaly Extremities: extremities normal, atraumatic, no cyanosis or edema and contracture of right upper extremity and hand contractures, positive clubbing of fingernails. Skin: Skin color, texture, turgor normal. No rashes or lesions Lymph nodes: Cervical, supraclavicular, and axillary nodes normal. Neurologic: A and O x 3.  Unable to walk.  Signs of cerebral palsy  LABORATORY DATA:   CBC    Component Value Date/Time   WBC 6.2 05/20/2015 0921   RBC 5.86* 05/20/2015 0921   HGB 18.2* 05/20/2015 0921   HCT 54.6* 05/20/2015 0921   PLT 206 05/20/2015 0921   MCV 93.2 05/20/2015 0921   MCH 31.1 05/20/2015 0921   MCHC 33.3 05/20/2015 0921   RDW 14.5 05/20/2015 0921   LYMPHSABS 1.5 05/20/2015 0921   MONOABS 0.4 05/20/2015 0921   EOSABS 0.3 05/20/2015 0921   BASOSABS 0.0 05/20/2015 0921   Results for Ian Hurley, Ian Hurley (MRN [id number]) as of 06/21/2015 20:05  Ref. Range 05/20/2015 09:20 05/20/2015 09:21  Sodium Latest Ref Range: 135-145 mmol/L  137  Potassium Latest Ref Range: 3.5-5.1 mmol/L  3.8  Chloride Latest Ref Range: 101-111 mmol/L  99 (L)  CO2 Latest Ref Range: 22-32 mmol/L  30  BUN Latest Ref Range: 6-20 mg/dL  12  Creatinine Latest Ref Range: 0.61-1.24 mg/dL  0.50 (L)  Calcium Latest Ref Range: 8.9-10.3 mg/dL  8.7 (L)  EGFR (Non-African Amer.) Latest Ref Range: >60 mL/min  >60  EGFR (African American) Latest Ref Range: >60 mL/min  >60  Glucose Latest Ref Range: 65-99 mg/dL  88  Anion gap Latest  Ref Range: 5-15   8  Alkaline Phosphatase Latest Ref Range: 38-126 U/L  101  Albumin Latest Ref Range: 3.5-5.0 g/dL  4.1  AST Latest Ref Range: 15-41 U/L  15  ALT Latest Ref Range: 17-63 U/L  16 (L)  Total Protein Latest Ref Range: 6.5-8.1 g/dL  8.4 (H)  Total Bilirubin Latest Ref Range: 0.3-1.2 mg/dL  0.5  Iron Latest Ref Range: 45-182 ug/dL 70   UIBC Latest Units: ug/dL 225   TIBC Latest Ref Range: 250-450 ug/dL 295   Saturation Ratios Latest Ref Range: 17.9-39.5 % 24   Ferritin Latest Ref Range: 24-336 ng/mL 57   Vitamin B-12 Latest Ref Range: 180-914 pg/mL 462   WBC Latest Ref Range: 4.0-10.5 K/uL  6.2  RBC  Latest Ref Range: 4.22-5.81 MIL/uL  5.86 (H)  Hemoglobin Latest Ref Range: 13.0-17.0 g/dL  18.2 (H)  HCT Latest Ref Range: 39.0-52.0 %  54.6 (H)  MCV Latest Ref Range: 78.0-100.0 fL  93.2  MCH Latest Ref Range: 26.0-34.0 pg  31.1  MCHC Latest Ref Range: 30.0-36.0 g/dL  33.3  RDW Latest Ref Range: 11.5-15.5 %  14.5  Platelets Latest Ref Range: 150-400 K/uL  206  Neutrophils Latest Ref Range: 43-77 %  65  Lymphocytes Latest Ref Range: 12-46 %  23  Monocytes Relative Latest Ref Range: 3-12 %  7  Eosinophil Latest Ref Range: 0-5 %  5  Basophil Latest Ref Range: 0-1 %  0  NEUT# Latest Ref Range: 1.7-7.7 K/uL  4.1  Lymphocyte # Latest Ref Range: 0.7-4.0 K/uL  1.5  Monocyte # Latest Ref Range: 0.1-1.0 K/uL  0.4  Eosinophils Absolute Latest Ref Range: 0.0-0.7 K/uL  0.3  Basophils Absolute Latest Ref Range: 0.0-0.1 K/uL  0.0  Erythropoietin Latest Ref Range: 2.6-18.5 mIU/mL  33.5 (H)  TSH Latest Ref Range: 0.350-4.500 uIU/mL 2.159    JAK 2 V617F mutation negative, Exon 12 and 13 mutation negative CALR mutation negative    ASSESSMENT:  1. Polycythemia 2. Cerebral Palsy 3. H/O severe iron deficiency anemia. 4. Seizure disorder.   PLAN:   I reviewed the patient's laboratory studies in detail. He has an inappropriately elevated erythropoietin level. He does not have any  known COPD or lung disease. I have recommended CT of the abdomen to r/o tumor polycythemia. We will regroup post CT imaging for additional recommendations. Currently I do not have evidence of primary polycythemia nor obvious cause of secondary polycythemia. On CXR from 03/18/2015 he has evidence of cardiomeagley and "low lung volumes." Cardiopulmonary evaluation may be a consideration moving forward.  All questions were answered. The patient knows to call the clinic with any problems, questions or concerns. We can certainly see the patient much sooner if necessary.   This document serves as a record of services personally performed by Ancil Linsey, MD. It was created on her behalf by Janace Hoard, a trained medical scribe. The creation of this record is based on the scribe's personal observations and the provider's statements to them. This document has been checked and approved by the attending provider.  I have reviewed the above documentation for accuracy and completeness, and I agree with the above.  Kelby Fam. Whitney Muse, MD

## 2015-06-27 ENCOUNTER — Ambulatory Visit: Payer: Medicare Other | Admitting: Gastroenterology

## 2015-06-30 ENCOUNTER — Ambulatory Visit (HOSPITAL_COMMUNITY)
Admission: RE | Admit: 2015-06-30 | Discharge: 2015-06-30 | Disposition: A | Discharge status: Home / Self Care | Payer: Medicare Other | Source: Ambulatory Visit | Attending: Hematology & Oncology | Admitting: Hematology & Oncology

## 2015-06-30 DIAGNOSIS — G809 Cerebral palsy, unspecified: Secondary | ICD-10-CM | POA: Diagnosis not present

## 2015-06-30 DIAGNOSIS — R933 Abnormal findings on diagnostic imaging of other parts of digestive tract: Secondary | ICD-10-CM | POA: Diagnosis not present

## 2015-06-30 DIAGNOSIS — Z8719 Personal history of other diseases of the digestive system: Secondary | ICD-10-CM | POA: Diagnosis not present

## 2015-06-30 DIAGNOSIS — D751 Secondary polycythemia: Secondary | ICD-10-CM | POA: Insufficient documentation

## 2015-06-30 DIAGNOSIS — K449 Diaphragmatic hernia without obstruction or gangrene: Secondary | ICD-10-CM | POA: Diagnosis not present

## 2015-06-30 DIAGNOSIS — I10 Essential (primary) hypertension: Secondary | ICD-10-CM | POA: Diagnosis not present

## 2015-06-30 MED ORDER — IOHEXOL 300 MG/ML  SOLN
100.0000 mL | Freq: Once | INTRAMUSCULAR | Status: AC | PRN
  Administered 2015-06-30: 100 mL via INTRAVENOUS

## 2015-07-06 ENCOUNTER — Ambulatory Visit (HOSPITAL_COMMUNITY): Payer: Medicare Other | Admitting: Oncology

## 2015-07-06 DIAGNOSIS — R319 Hematuria, unspecified: Secondary | ICD-10-CM | POA: Diagnosis not present

## 2015-07-06 DIAGNOSIS — D649 Anemia, unspecified: Secondary | ICD-10-CM | POA: Diagnosis not present

## 2015-07-06 NOTE — Progress Notes (Signed)
No show

## 2015-07-06 NOTE — Assessment & Plan Note (Deleted)
Polycythemia,  JAK 2 V617F mutation negative, Exon 12 and 13 mutation negative, and CALR mutation negative with an inappropriately elevated EPO level.  CT abd in June 2016 is negative for any tumor related polycythemia.

## 2015-07-19 ENCOUNTER — Encounter: Payer: Self-pay | Admitting: Nurse Practitioner

## 2015-07-19 ENCOUNTER — Ambulatory Visit (INDEPENDENT_AMBULATORY_CARE_PROVIDER_SITE_OTHER): Payer: Medicare Other | Admitting: Nurse Practitioner

## 2015-07-19 VITALS — BP 150/99 | HR 111 | Temp 97.0°F

## 2015-07-19 DIAGNOSIS — D509 Iron deficiency anemia, unspecified: Secondary | ICD-10-CM | POA: Diagnosis not present

## 2015-07-19 DIAGNOSIS — K5900 Constipation, unspecified: Secondary | ICD-10-CM

## 2015-07-19 NOTE — Patient Instructions (Signed)
1. Have your blood work drawn when you can. 2. Start taking Colace (a stool softener over-the-counter) every day to help promote good bowel movements and prevent constipation. 3. You can clean your right knee where it is scraped with mild soap and water every day until it is healed. He can use an ice pack as you need to for right knee pain until it is healed. 4. Return for further evaluation in 6 months.  5. Continue to follow up with the cancer center as they direct.

## 2015-07-19 NOTE — Progress Notes (Signed)
CC'ED TO PCP 

## 2015-07-19 NOTE — Assessment & Plan Note (Signed)
Patient with symptoms of constipation. Is on iron supplementation. Advised to take Colace daily stool softener to promote good bowel movements. Return for further evaluation in 6 months.

## 2015-07-19 NOTE — Progress Notes (Signed)
Referring Provider: Rosita Fire, MD Primary Care Physician:  Rosita Fire, MD Primary GI:  Dr. Oneida Alar  Chief Complaint  Patient presents with  . Follow-up    doing fine    HPI:   49 year old male presents for follow-up on iron deficiency anemia. At last office visit 6 months ago he was doing quite well. Colonoscopy and endoscopy completed in 2015. Recommend repeat colonoscopy in 10 years. Endoscopy found a large hiatal hernia, mild nonerosive gastritis, small gastric polyp. Recommended consider capsule study if no source for anemia identified on the colonoscopy. Since his last visit he has been evaluated by the cancer center for polycythemia. Last CBC 05/20/2015 with an RBC of 5.86, hemoglobin of 18.2, hematocrit 54.6. He is being worked up by oncology with no abdominal tumor found on CT, negative for all mutation studies. Iron studies completed 05/20/2015 were all normal including iron, TIBC, saturation, and UIBC.  Today he states he is doing well. Does have constipation. Has been seeing the cancer center. CT was normal. Denies abdominal pain, N/V, hematochezia. Does have dark stools but they are formed and is on iron supplementation. Denies chest pain, dyspnea, dizziness, lightheadedness, syncope, near syncope. Denies any other upper or lower GI symptoms.  Past Medical History  Diagnosis Date  . Cerebral palsy   . Seizures   . Anxiety   . Depression   . 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 Erosive esophagitis MAY 2015    HB 6.6 MCV 63 FERRITIN 6  . Duodenitis MAY 2015    HB 6.6 MCV 63 FERRITIN 6  . Polycythemia 05/17/2015  . Anemia     Past Surgical History  Procedure Laterality Date  . Esophagogastroduodenoscopy N/A 05/23/2014    IEP:PIRJJ HH/mild non-erosive gastritis/small gastric polyp  . Flexible sigmoidoscopy N/A 05/23/2014    Procedure: FLEXIBLE SIGMOIDOSCOPY;  Surgeon: Danie Binder, MD;  [facility];  Service: Endoscopy;   Laterality: N/A;  . Colonoscopy N/A 05/24/2014    OAC:ZYSAYT rectal polyp otherwise normal    Current Outpatient Prescriptions  Medication Sig Dispense Refill  . cyclobenzaprine (FLEXERIL) 10 MG tablet Take 10 mg by mouth 3 (three) times daily.    . ferrous sulfate 325 (65 FE) MG tablet TAKE 1 TABLET BY MOUTH TWICE DAILY. 60 tablet 3  . furosemide (LASIX) 40 MG tablet Take 40 mg by mouth daily.    Marland Kitchen liver oil-zinc oxide (BOUDREAUXS BUTT PASTE) 40 % ointment Apply 1 application topically as needed for irritation.    Marland Kitchen LORazepam (ATIVAN) 1 MG tablet Take 1 mg by mouth every 8 (eight) hours as needed for anxiety.    . pantoprazole (PROTONIX) 40 MG tablet Take 1 tablet (40 mg total) by mouth 2 (two) times daily before a meal. 60 tablet 3  . PHENObarbital (LUMINAL) 30 MG tablet Take 30-60 mg by mouth 2 (two) times daily. 1 tablet in the morning and 2 tablets at bedtime.    . potassium chloride SA (K-DUR,KLOR-CON) 20 MEQ tablet Take 40 mEq by mouth daily.     . QUEtiapine (SEROQUEL) 50 MG tablet Take 50 mg by mouth 2 (two) times daily.    . sertraline (ZOLOFT) 100 MG tablet Take 100 mg by mouth daily.    . traMADol (ULTRAM) 50 MG tablet Take 50 mg by mouth 2 (two) times daily as needed for moderate pain.      No current facility-administered medications for this visit.  Allergies as of 07/19/2015  . (No Known Allergies)    No family history on file.  History   Social History  . Marital Status: Single    Spouse Name: N/A  . Number of Children: N/A  . Years of Education: N/A   Social History Main Topics  . Smoking status: Former Smoker    Types: Cigarettes    Quit date: 01/16/2014  . Smokeless tobacco: Not on file     Comment: Former smoker  . Alcohol Use: No  . Drug Use: No  . Sexual Activity: Not on file   Other Topics Concern  . None   Social History Narrative    Review of Systems: 10 point ROS negative except as per HPI.   Physical Exam: BP 150/99 mmHg  Pulse  111  Temp(Src) 97 F (36.1 C) (Oral) General:   Alert and oriented. Pleasant and cooperative. Well-nourished and well-developed. Somewhat difficult to understand speech, is sitting in a wheelchair with contracted right hand. Head:  Normocephalic and atraumatic. Eyes:  Without icterus, sclera clear and conjunctiva pink.  Ears:  Normal auditory acuity. Cardiovascular:  S1, S2 present without murmurs appreciated. Normal pulses noted. Extremities without clubbing or edema. Respiratory:  Clear to auscultation bilaterally. No wheezes, rales, or rhonchi. No distress.  Gastrointestinal:  +BS, rounded, soft, non-tender and non-distended. No HSM noted. No guarding or rebound. No masses appreciated.  Rectal:  Deferred  Skin:  Scrape noted to anterior right patellar area without edema, erythema, or drainage.Marland Kitchen Psych:  Alert and cooperative. Normal mood and affect. Heme/Lymph/Immune: No excessive bruising noted.    07/19/2015 2:28 PM

## 2015-07-19 NOTE — Assessment & Plan Note (Signed)
Patient with a history of iron deficiency anemia with previous hospital admissions for severe anemia. He is on iron supplementation his last iron studies were all normal. Since his last visit he has been evaluated by the cancer center for subsequent polycythemia. At this point per the [facility] records his mutation labs are normal and CT was also normal. At this point we will recheck his CBC as it has not been checked in a few months. Continue seen the cancer center per their recommendations. Return for further evaluation in 6 months.

## 2015-07-20 IMAGING — DX DG CHEST 2V
2 series · 2 of 2 positions shown · non-contrast
Comparison: Radiograph [REDACTED]

CLINICAL DATA: Tachycardia, hypertension, cerebral palsy

EXAM:
CHEST  2 VIEW

[chest lat]
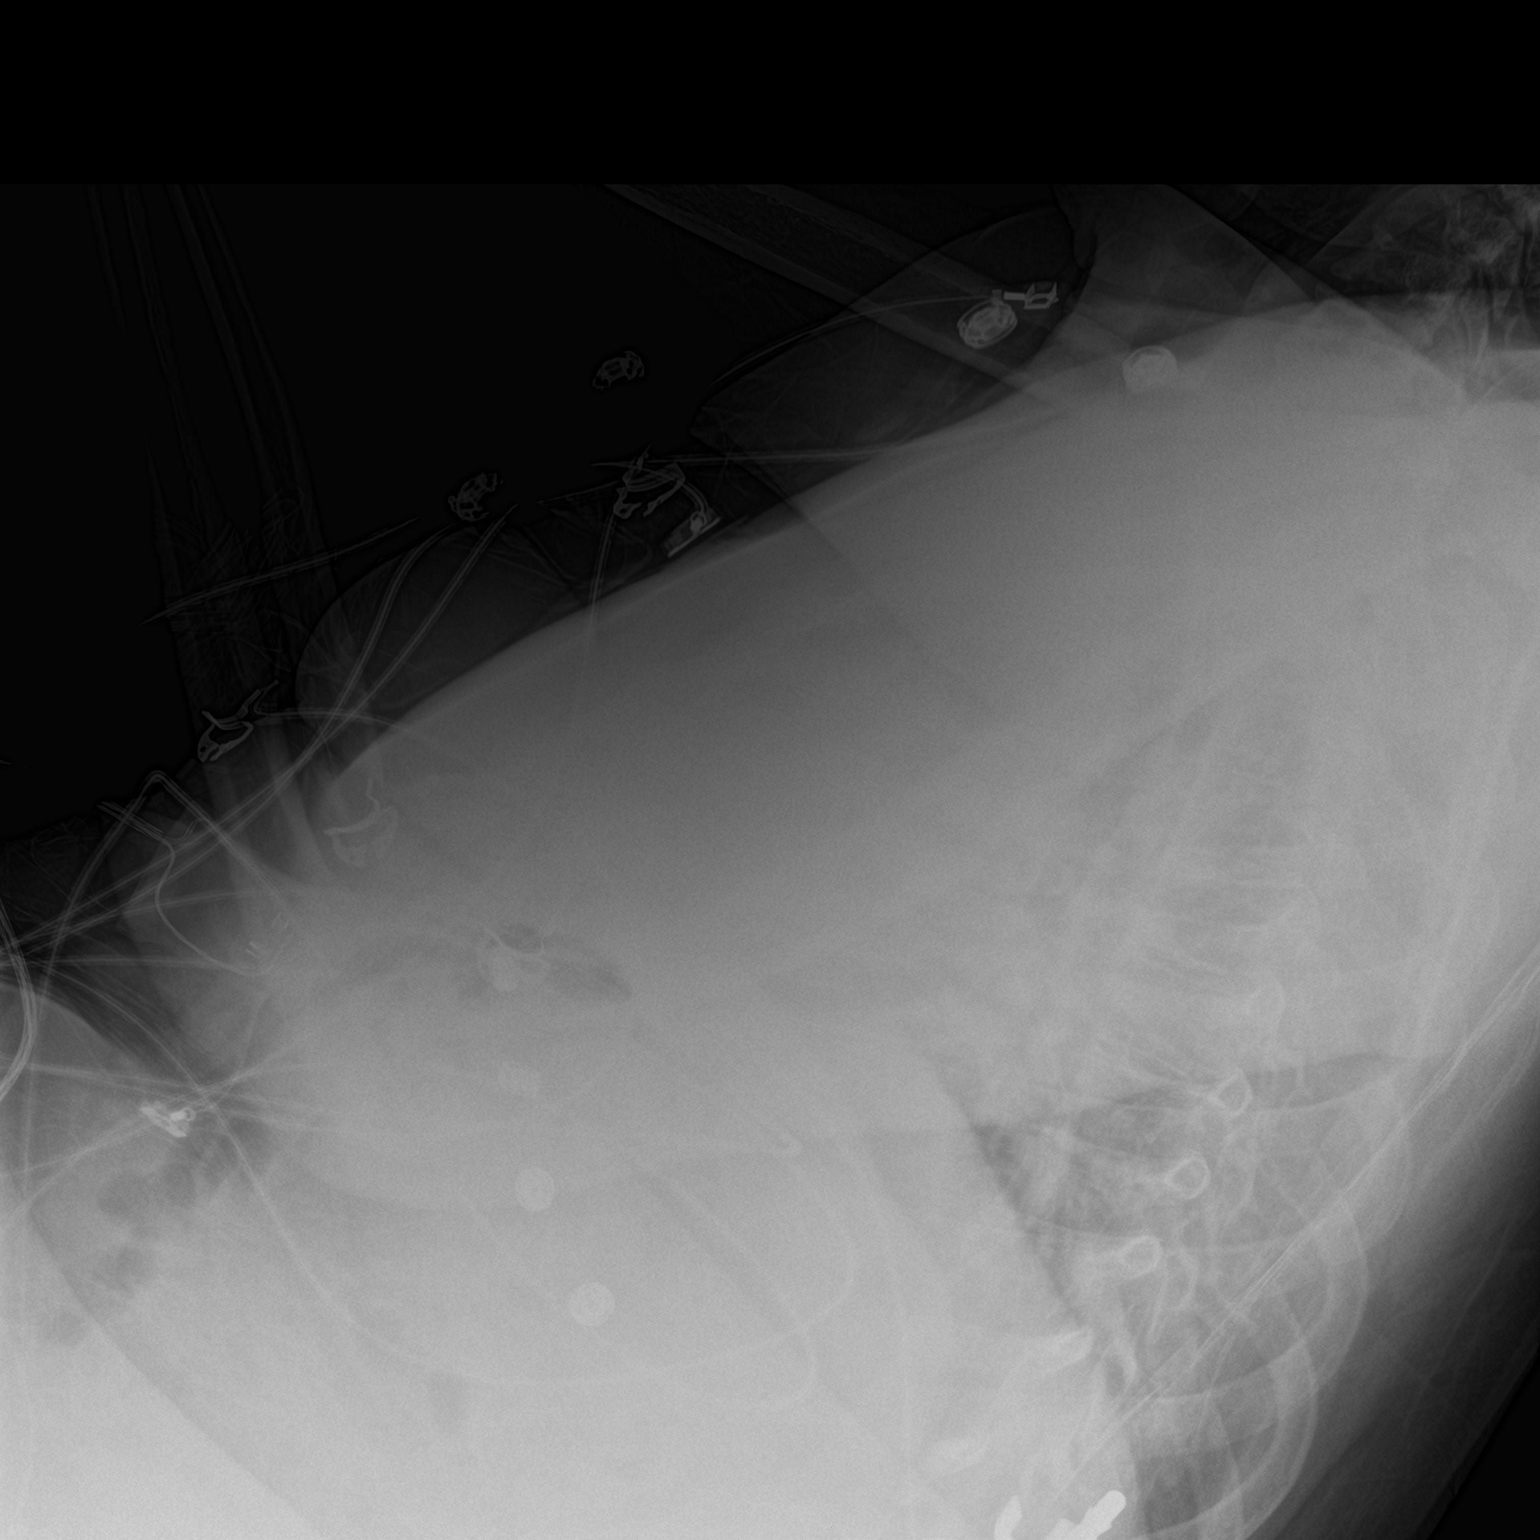

[chest ap strecther]
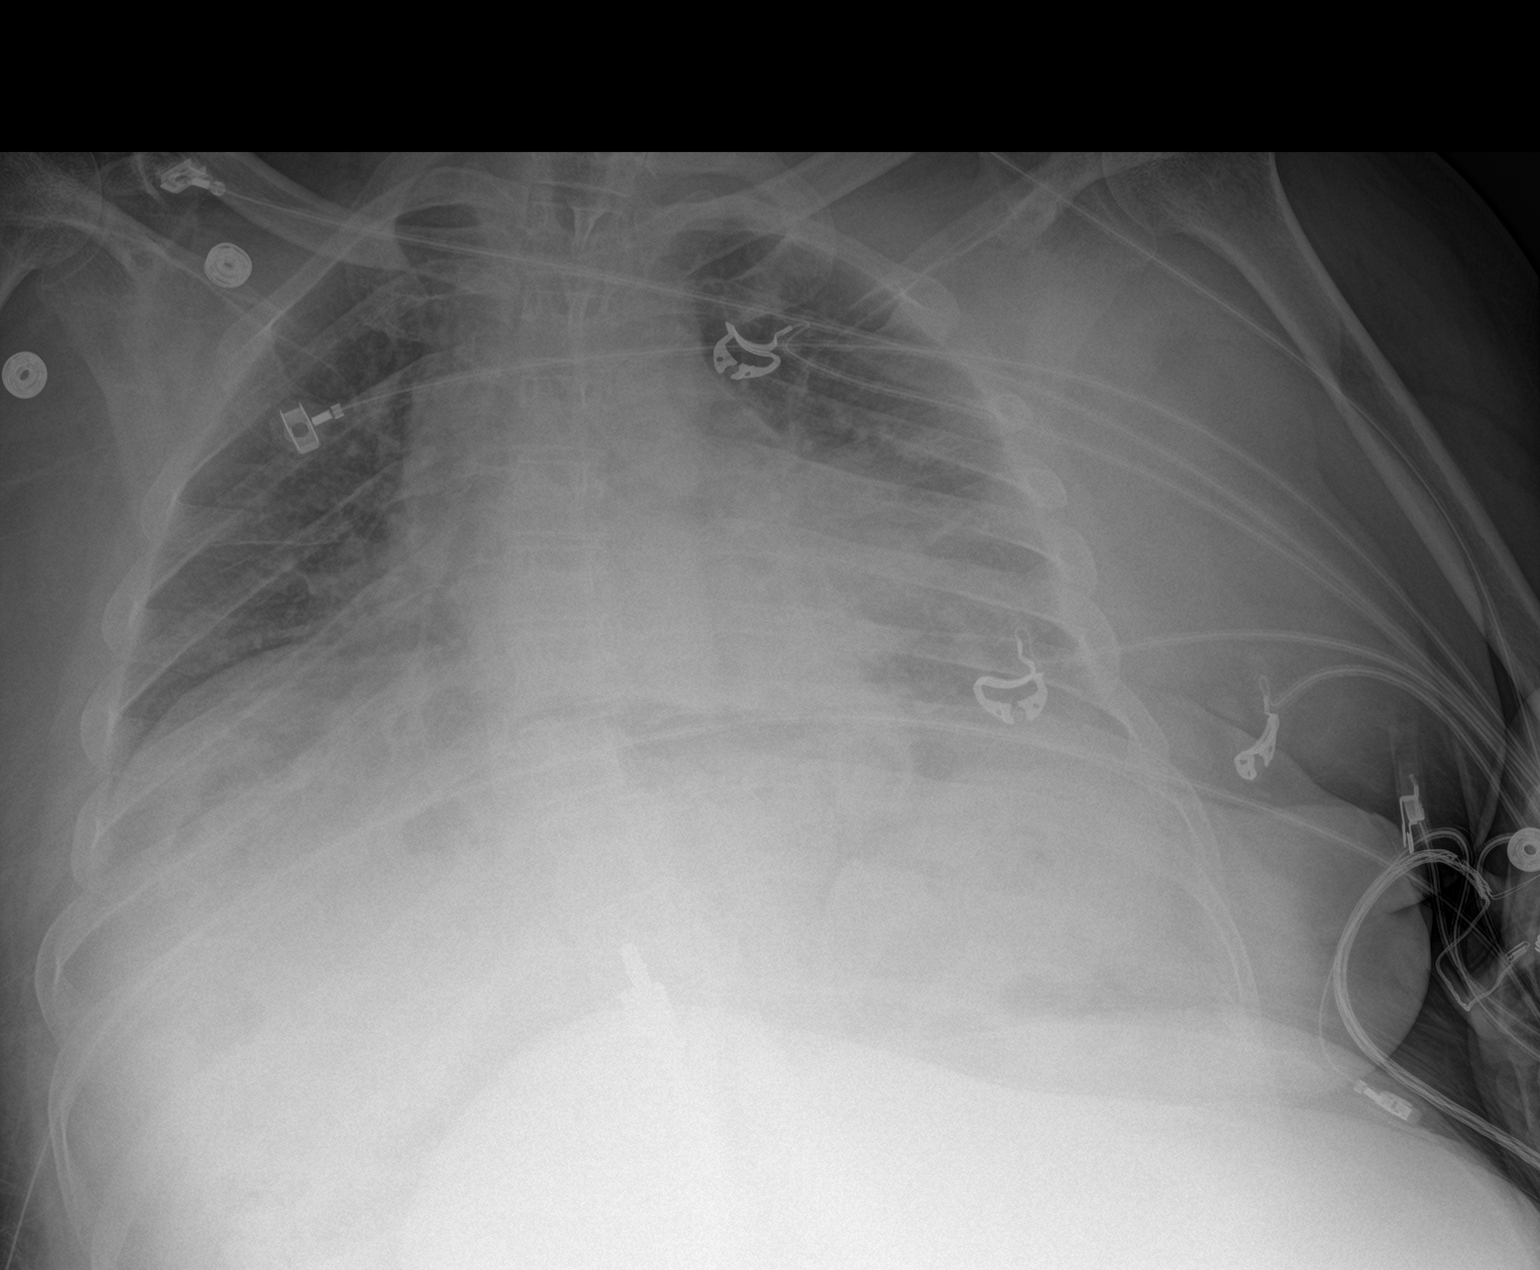

[2 of 2 positions shown; findings below may reference images not displayed]

FINDINGS: Stable enlarged cardiac silhouette. There are low lung volumes and
bilateral pleural effusions. The lateral projection is partially
obscured by patient's arms.
IMPRESSION: Cardiomegaly, low lung volumes and small effusions. No overt
pulmonary edema or infiltrate.

## 2015-07-21 ENCOUNTER — Encounter (HOSPITAL_COMMUNITY): Payer: Medicare Other | Attending: Oncology | Admitting: Oncology

## 2015-07-21 ENCOUNTER — Encounter (HOSPITAL_BASED_OUTPATIENT_CLINIC_OR_DEPARTMENT_OTHER): Payer: Medicare Other

## 2015-07-21 ENCOUNTER — Encounter (HOSPITAL_COMMUNITY): Payer: Self-pay | Admitting: Oncology

## 2015-07-21 VITALS — BP 123/94 | HR 122 | Temp 97.9°F | Resp 20

## 2015-07-21 DIAGNOSIS — E611 Iron deficiency: Secondary | ICD-10-CM | POA: Diagnosis not present

## 2015-07-21 DIAGNOSIS — D751 Secondary polycythemia: Secondary | ICD-10-CM | POA: Diagnosis not present

## 2015-07-21 DIAGNOSIS — F329 Major depressive disorder, single episode, unspecified: Secondary | ICD-10-CM | POA: Diagnosis not present

## 2015-07-21 DIAGNOSIS — G809 Cerebral palsy, unspecified: Secondary | ICD-10-CM | POA: Insufficient documentation

## 2015-07-21 DIAGNOSIS — G40909 Epilepsy, unspecified, not intractable, without status epilepticus: Secondary | ICD-10-CM | POA: Diagnosis not present

## 2015-07-21 LAB — CBC WITH DIFFERENTIAL/PLATELET
BASOS PCT: 1 % (ref 0–1)
Basophils Absolute: 0 10*3/uL (ref 0.0–0.1)
EOS PCT: 4 % (ref 0–5)
Eosinophils Absolute: 0.3 10*3/uL (ref 0.0–0.7)
HCT: 51.4 % (ref 39.0–52.0)
Hemoglobin: 17.2 g/dL — ABNORMAL HIGH (ref 13.0–17.0)
LYMPHS PCT: 19 % (ref 12–46)
Lymphs Abs: 1.5 10*3/uL (ref 0.7–4.0)
MCH: 31.6 pg (ref 26.0–34.0)
MCHC: 33.5 g/dL (ref 30.0–36.0)
MCV: 94.3 fL (ref 78.0–100.0)
MONOS PCT: 13 % — AB (ref 3–12)
Monocytes Absolute: 1 10*3/uL (ref 0.1–1.0)
NEUTROS PCT: 63 % (ref 43–77)
Neutro Abs: 4.9 10*3/uL (ref 1.7–7.7)
Platelets: 204 10*3/uL (ref 150–400)
RBC: 5.45 MIL/uL (ref 4.22–5.81)
RDW: 14.6 % (ref 11.5–15.5)
WBC: 7.7 10*3/uL (ref 4.0–10.5)

## 2015-07-21 NOTE — Assessment & Plan Note (Addendum)
JAK2 V617F mutation negative, Exon 12 and 13 mutation negative, CALR mutation negative Polycythemia with an elevated EPO level.  Labs today: CBC diff  I suspect episode(s) of hypoxia during sleep.  Despite his denial of signs and symptoms of sleep apnea, he has the body habitus for this condition.  Will pursue sleep study.  Labs in 4 weeks: CBC diff  Return in 4 weeks for follow-up.

## 2015-07-21 NOTE — Progress Notes (Signed)
LABS DRAWN X2 STICKS

## 2015-07-21 NOTE — Patient Instructions (Signed)
[place] at [facility] Discharge Instructions  RECOMMENDATIONS MADE BY THE CONSULTANT AND ANY TEST RESULTS WILL BE SENT TO YOUR REFERRING PHYSICIAN.  Exam completed by Kirby Crigler today CBC today Sleep study in future  Return in 4 weeks to see the doctor Please call the clinic if you have any questions or concerns  Thank you for choosing [place] at [facility] to provide your oncology and hematology care.  To afford each patient quality time with our provider, please arrive at least 15 minutes before your scheduled appointment time.    You need to re-schedule your appointment should you arrive 10 or more minutes late.  We strive to give you quality time with our providers, and arriving late affects you and other patients whose appointments are after yours.  Also, if you no show three or more times for appointments you may be dismissed from the clinic at the providers discretion.     Again, thank you for choosing [facility].  Our hope is that these requests will decrease the amount of time that you wait before being seen by our physicians.       _____________________________________________________________  Should you have questions after your visit to [facility], please contact our office at [phone] between the hours of 8:30 a.m. and 4:30 p.m.  Voicemails left after 4:30 p.m. will not be returned until the following business day.  For prescription refill requests, have your pharmacy contact our office.

## 2015-07-21 NOTE — Progress Notes (Signed)
[place], [place] [address]  Polycythemia - Plan: CBC with Differential, CBC with Differential, Polysomnography 4 or more parameters, CBC with Differential  CURRENT THERAPY: Surveillance  INTERVAL HISTORY: VYRON FRONCZAK 49 y.o. male returns for followup of JAK2 V617F mutation negative, Exon 12 and 13 mutation negative, CALR mutation negative Polycythemia with an elevated EPO level.  I personally reviewed and went over laboratory results with the patient.  The results are noted within this dictation.  We will update labs today.  The final piece of work-up is a sleep study.  Giovonni is agreeable to this test.  I suspect hypoxia event(s) during sleep.  He has the body habitus of someone with sleep apnea, despite him denying signs and symptoms of sleep apnea.    Past Medical History  Diagnosis Date  . Cerebral palsy   . Seizures   . Anxiety   . Depression   . 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 Erosive esophagitis MAY 2015    HB 6.6 MCV 63 FERRITIN 6  . Duodenitis MAY 2015    HB 6.6 MCV 63 FERRITIN 6  . Polycythemia 05/17/2015  . Anemia     has Anemia; Seizure disorder; Anxiety; Cerebral palsy; Depression; Polycythemia; and Constipation on his problem list.     has No Known Allergies.  Current Outpatient Prescriptions on File Prior to Visit  Medication Sig Dispense Refill  . cyclobenzaprine (FLEXERIL) 10 MG tablet Take 10 mg by mouth 3 (three) times daily.    . ferrous sulfate 325 (65 FE) MG tablet TAKE 1 TABLET BY MOUTH TWICE DAILY. 60 tablet 3  . furosemide (LASIX) 40 MG tablet Take 40 mg by mouth daily.    Marland Kitchen liver oil-zinc oxide (BOUDREAUXS BUTT PASTE) 40 % ointment Apply 1 application topically as needed for irritation.    Marland Kitchen LORazepam (ATIVAN) 1 MG tablet Take 1 mg by mouth every 8 (eight) hours as needed for anxiety.    . pantoprazole (PROTONIX) 40 MG tablet Take 1 tablet (40 mg total) by mouth 2  (two) times daily before a meal. 60 tablet 3  . PHENObarbital (LUMINAL) 30 MG tablet Take 30-60 mg by mouth 2 (two) times daily. 1 tablet in the morning and 2 tablets at bedtime.    . potassium chloride SA (K-DUR,KLOR-CON) 20 MEQ tablet Take 40 mEq by mouth daily.     . QUEtiapine (SEROQUEL) 50 MG tablet Take 50 mg by mouth 2 (two) times daily.    . sertraline (ZOLOFT) 100 MG tablet Take 100 mg by mouth daily.    . traMADol (ULTRAM) 50 MG tablet Take 50 mg by mouth 2 (two) times daily as needed for moderate pain.      No current facility-administered medications on file prior to visit.    Past Surgical History  Procedure Laterality Date  . Esophagogastroduodenoscopy N/A 05/23/2014    ZGY:FVCBS HH/mild non-erosive gastritis/small gastric polyp  . Flexible sigmoidoscopy N/A 05/23/2014    Procedure: FLEXIBLE SIGMOIDOSCOPY;  Surgeon: Danie Binder, MD;  [facility]: [facility];  Service: Endoscopy;  Laterality: N/A;  . Colonoscopy N/A 05/24/2014    WHQ:PRFFMB rectal polyp otherwise normal    Denies any headaches, dizziness, double vision, fevers, chills, night sweats, nausea, vomiting, diarrhea, constipation, chest pain, heart palpitations, shortness of breath, blood in stool, black tarry stool, urinary pain, urinary burning, urinary frequency, hematuria.   PHYSICAL EXAMINATION  ECOG PERFORMANCE STATUS: 2 - Symptomatic, <50% confined to bed  Filed Vitals:   07/21/15 0900  BP: 123/94  Pulse: 122  Temp: 97.9 F (36.6 C)  Resp: 20    GENERAL:alert, no distress, well nourished, well developed, comfortable, cooperative, obese, smiling and in wheelchair SKIN: skin color, texture, turgor are normal, no rashes or significant lesions HEAD: Normocephalic, No masses, lesions, tenderness or abnormalities EYES: normal, PERRLA, EOMI, Conjunctiva are pink and non-injected EARS: External ears normal OROPHARYNX:lips, buccal mucosa, and tongue normal and mucous membranes are moist  NECK: supple,  trachea midline LYMPH:  no palpable lymphadenopathy BREAST:not examined LUNGS: clear to auscultation  HEART: regular rate & rhythm, no murmurs, no gallops, S1 normal and S2 normal ABDOMEN:abdomen soft, non-tender, obese and normal bowel sounds BACK: Back symmetric, no curvature. EXTREMITIES:less then 2 second capillary refill, no joint deformities, effusion, or inflammation, no skin discoloration, no cyanosis  NEURO: alert & oriented x 3 with fluent speech, no focal motor/sensory deficits   LABORATORY DATA: CBC    Component Value Date/Time   WBC 6.2 05/20/2015 0921   RBC 5.86* 05/20/2015 0921   HGB 18.2* 05/20/2015 0921   HCT 54.6* 05/20/2015 0921   PLT 206 05/20/2015 0921   MCV 93.2 05/20/2015 0921   MCH 31.1 05/20/2015 0921   MCHC 33.3 05/20/2015 0921   RDW 14.5 05/20/2015 0921   LYMPHSABS 1.5 05/20/2015 0921   MONOABS 0.4 05/20/2015 0921   EOSABS 0.3 05/20/2015 0921   BASOSABS 0.0 05/20/2015 0921      Chemistry      Component Value Date/Time   NA 137 05/20/2015 0921   K 3.8 05/20/2015 0921   CL 99* 05/20/2015 0921   CO2 30 05/20/2015 0921   BUN 12 05/20/2015 0921   CREATININE 0.50* 05/20/2015 0921      Component Value Date/Time   CALCIUM 8.7* 05/20/2015 0921   ALKPHOS 101 05/20/2015 0921   AST 15 05/20/2015 0921   ALT 16* 05/20/2015 0921   BILITOT 0.5 05/20/2015 0921        PENDING LABS:   RADIOGRAPHIC STUDIES:  Ct Abdomen W Contrast  07-12-2015   CLINICAL DATA:  Polycythemia with elevated serum erythropoietin levels. History of hypertension, cerebral palsy, atrophic gastritis and duodenitis. Initial encounter.  EXAM: CT ABDOMEN WITH CONTRAST  TECHNIQUE: Multidetector CT imaging of the abdomen was performed using the standard protocol following bolus administration of intravenous contrast.  CONTRAST:  171mL OMNIPAQUE IOHEXOL 300 MG/ML  SOLN  COMPARISON:  None.  FINDINGS: Lower chest: Dependent atelectasis at both lung bases. No significant pleural or  pericardial effusion.  Hepatobiliary: The liver is normal in density without focal abnormality. No evidence of gallstones, gallbladder wall thickening or biliary dilatation.  Pancreas: Unremarkable. No pancreatic ductal dilatation or surrounding inflammatory changes.  Spleen: Normal in size without focal abnormality.  Adrenals/Urinary Tract: Both adrenal glands appear normal.There is cortical scarring in the upper pole of the right kidney. The left kidney appears normal. There is no hydronephrosis. Bladder not imaged.  Stomach/Bowel: There is a moderate size hiatal hernia. There is a large amount of ingested material within the stomach which is moderately distended. The visualized small bowel, colon and appendix appear unremarkable.  Vascular/Lymphatic: There are no enlarged abdominal lymph nodes. No significant vascular findings are present.  Other: No evidence of abdominal wall mass or hernia.  Musculoskeletal: Moderate thoracolumbar scoliosis status post Harrington rod fixation. The fixation rod is discontinuous at the thoracolumbar junction. This appears unchanged from chest radiographs 03/25/2011.  IMPRESSION: 1. No acute abdominal findings. 2. Moderate-size hiatal hernia with distended stomach and retained ingested material suggesting gastroparesis. 3. Scarring in the upper pole of the right kidney. 4. Scoliosis post rod fixation. The rod is chronically discontinuous at the thoracolumbar junction.   Electronically Signed   By: Richardean Sale M.D.   On: 06/30/2015 13:27     PATHOLOGY:    ASSESSMENT AND PLAN:  Polycythemia JAK2 V617F mutation negative, Exon 12 and 13 mutation negative, CALR mutation negative Polycythemia with an elevated EPO level.  Labs today: CBC diff  I suspect episode(s) of hypoxia during sleep.  Despite his denial of signs and symptoms of sleep apnea, he has the body habitus for this condition.  Will pursue sleep study.  Labs in 4 weeks: CBC diff  Return in 4 weeks for  follow-up.   THERAPY PLAN:  Sleep study in the future.    All questions were answered. The patient knows to call the clinic with any problems, questions or concerns. We can certainly see the patient much sooner if necessary.  Patient and plan discussed with Dr. Ancil Linsey and she is in agreement with the aforementioned.   This note is electronically signed by: Doy Mince 07/21/2015 9:48 AM

## 2015-07-29 ENCOUNTER — Other Ambulatory Visit (HOSPITAL_COMMUNITY): Payer: Self-pay | Admitting: Respiratory Therapy

## 2015-08-01 ENCOUNTER — Other Ambulatory Visit (HOSPITAL_COMMUNITY): Payer: Self-pay | Admitting: Respiratory Therapy

## 2015-08-17 ENCOUNTER — Ambulatory Visit: Payer: Medicare Other | Attending: Oncology | Admitting: Sleep Medicine

## 2015-08-17 DIAGNOSIS — R0683 Snoring: Secondary | ICD-10-CM | POA: Insufficient documentation

## 2015-08-17 DIAGNOSIS — G4730 Sleep apnea, unspecified: Secondary | ICD-10-CM | POA: Insufficient documentation

## 2015-08-17 DIAGNOSIS — D751 Secondary polycythemia: Secondary | ICD-10-CM

## 2015-08-18 ENCOUNTER — Other Ambulatory Visit (HOSPITAL_COMMUNITY): Payer: Medicare Other

## 2015-08-18 ENCOUNTER — Ambulatory Visit (HOSPITAL_COMMUNITY): Payer: Medicare Other | Admitting: Oncology

## 2015-08-18 NOTE — Assessment & Plan Note (Deleted)
JAK2 V617F mutation negative, Exon 12 and 13 mutation negative, CALR mutation negative Polycythemia with an elevated EPO level.  Labs today: CBC diff  I suspect episode(s) of hypoxia during sleep.  Despite his denial of signs and symptoms of sleep apnea, he has the body habitus for this condition.  Will pursue sleep study.  Labs in 4 weeks: CBC diff  Return in 4 weeks for follow-up.

## 2015-08-18 NOTE — Progress Notes (Signed)
No show

## 2015-08-20 NOTE — Sleep Study (Signed)
  [place] A. Merlene Laughter, MD     [email or web address]             NOCTURNAL POLYSOMNOGRAPHY   LOCATION: [facility]  Patient Name: Ian Hurley, Ian Hurley Date: 08/17/2015 Gender: Male D.O.B: 1966/12/24 Age (years): 21 Referring Provider: Manon Hilding. [place] Height (inches): 60 Interpreting Physician: Phillips Odor MD, ABSM Weight (lbs): 160 RPSGT: Mort Sawyers BMI: 31 MRN: [id number] Neck Size: 18.50 CLINICAL INFORMATION Sleep Study Type: NPSG  Indication for sleep study: Witnesses Apnea / Gasping During Sleep  Epworth Sleepiness Score: 2  SLEEP STUDY TECHNIQUE As per the AASM Manual for the Scoring of Sleep and Associated Events v2.3 (April 2016) with a hypopnea requiring 4% desaturations.  The channels recorded and monitored were frontal, central and occipital EEG, electrooculogram (EOG), submentalis EMG (chin), nasal and oral airflow, thoracic and abdominal wall motion, anterior tibialis EMG, snore microphone, electrocardiogram, and pulse oximetry.  MEDICATIONS Patient's medications include: N/A. Medications self-administered by patient during sleep study : No sleep medicine administered.  SLEEP ARCHITECTURE The study was initiated at 10:05:28 PM and ended at 4:51:49 AM.  Sleep onset time was 99.6 minutes and the sleep efficiency was 69.3%. The total sleep time was 281.8 minutes.  Stage REM latency was 117.5 minutes.  The patient spent 7.98% of the night in stage N1 sleep, 73.56% in stage N2 sleep, 0.35% in stage N3 and 18.10% in REM.  Alpha intrusion was absent.  Supine sleep was 100.00%.  RESPIRATORY PARAMETERS The overall apnea/hypopnea index (AHI) was 2.6 per hour. There were 0 total apneas, including 0 obstructive, 0 central and 0 mixed apneas. There were 12 hypopneas and 0 RERAs.  The AHI during Stage REM sleep was 10.6 per hour.  AHI while supine was 2.6 per hour.  The mean oxygen saturation was 93.62%. The minimum SpO2 during sleep  was 79.00%.  Moderate snoring was noted during this study.  CARDIAC DATA The 2 lead EKG demonstrated sinus rhythm. The mean heart rate was 92.29 beats per minute.  LEG MOVEMENT DATA The total PLMS were 0 with a resulting PLMS index of 0.00. Associated arousal with leg movement index was 0.0 .  IMPRESSIONS No significant obstructive sleep apnea occurred during this study. No significant central sleep apnea occurred during this study.     Delano Metz, MD Diplomate, American Board of Sleep Medicine.

## 2015-08-22 ENCOUNTER — Other Ambulatory Visit (HOSPITAL_COMMUNITY): Payer: Self-pay | Admitting: Respiratory Therapy

## 2015-09-02 ENCOUNTER — Encounter: Payer: Self-pay | Admitting: Nurse Practitioner

## 2015-09-08 ENCOUNTER — Ambulatory Visit (HOSPITAL_COMMUNITY): Payer: Medicare Other | Admitting: Oncology

## 2015-09-08 ENCOUNTER — Other Ambulatory Visit (HOSPITAL_COMMUNITY): Payer: Medicare Other

## 2015-09-26 ENCOUNTER — Encounter (HOSPITAL_COMMUNITY): Payer: Self-pay

## 2015-09-26 DIAGNOSIS — G40909 Epilepsy, unspecified, not intractable, without status epilepticus: Secondary | ICD-10-CM | POA: Diagnosis not present

## 2015-09-26 DIAGNOSIS — R270 Ataxia, unspecified: Secondary | ICD-10-CM | POA: Diagnosis not present

## 2015-09-26 DIAGNOSIS — D45 Polycythemia vera: Secondary | ICD-10-CM | POA: Diagnosis not present

## 2015-09-26 DIAGNOSIS — I10 Essential (primary) hypertension: Secondary | ICD-10-CM | POA: Diagnosis not present

## 2015-09-26 DIAGNOSIS — G809 Cerebral palsy, unspecified: Secondary | ICD-10-CM | POA: Diagnosis not present

## 2015-10-17 DIAGNOSIS — F419 Anxiety disorder, unspecified: Secondary | ICD-10-CM | POA: Diagnosis not present

## 2015-10-17 DIAGNOSIS — G809 Cerebral palsy, unspecified: Secondary | ICD-10-CM | POA: Diagnosis not present

## 2015-10-17 DIAGNOSIS — Z23 Encounter for immunization: Secondary | ICD-10-CM | POA: Diagnosis not present

## 2015-11-01 IMAGING — CT CT ABDOMEN W/ CM
2 of 8 series · 13 of 46 positions shown, 18 images · IV contrast (Omnipaque 300)
Comparison: None.

CLINICAL DATA: Polycythemia with elevated serum erythropoietin
levels. History of hypertension, cerebral palsy, atrophic gastritis
and duodenitis. Initial encounter.

EXAM:
CT ABDOMEN WITH CONTRAST
TECHNIQUE: Multidetector CT imaging of the abdomen was performed using the
standard protocol following bolus administration of intravenous
contrast.
CONTRAST:  100mL OMNIPAQUE IOHEXOL 300 MG/ML  SOLN

[Series 2: abd_pel_with 5.0 b40f · axial · 0.75mm/px · z∈[+782,+1002]mm · 10 of 52 slices shown, 15 images]
[im 4/52  soft-tissue]
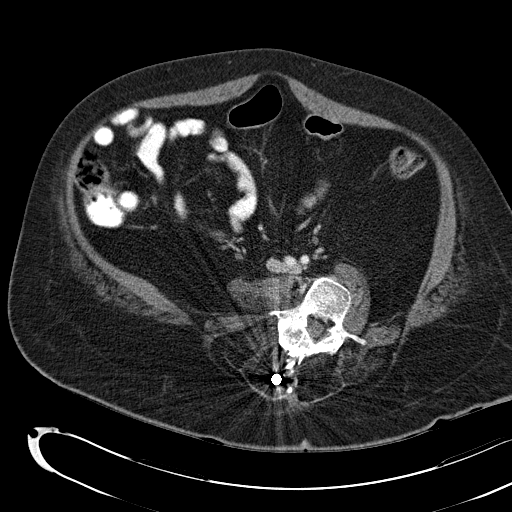
[im 4/52  bone]
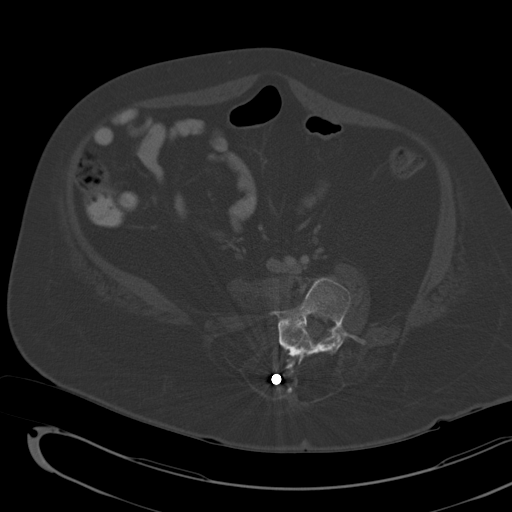
[im 11/52  soft-tissue]
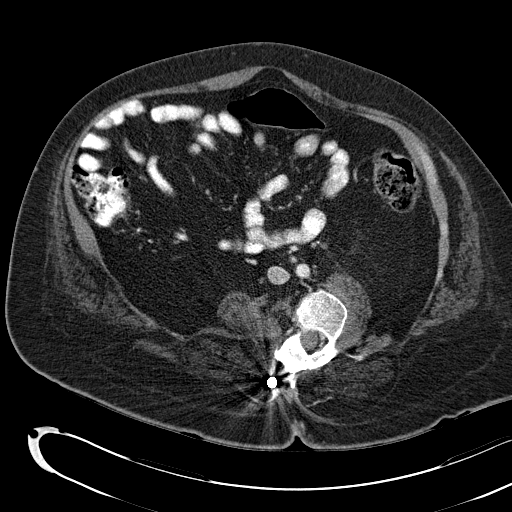
[im 15/52  soft-tissue]
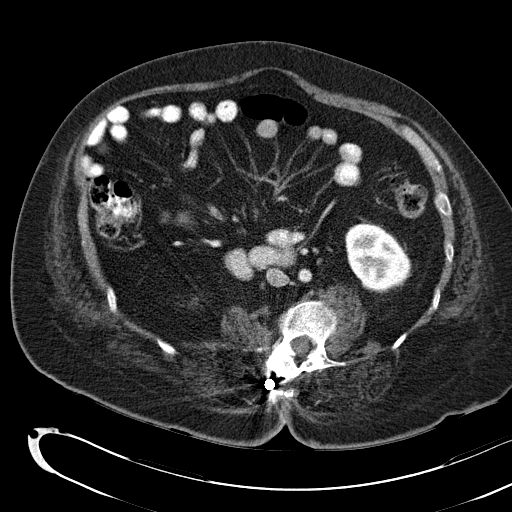
[im 22/52  soft-tissue]
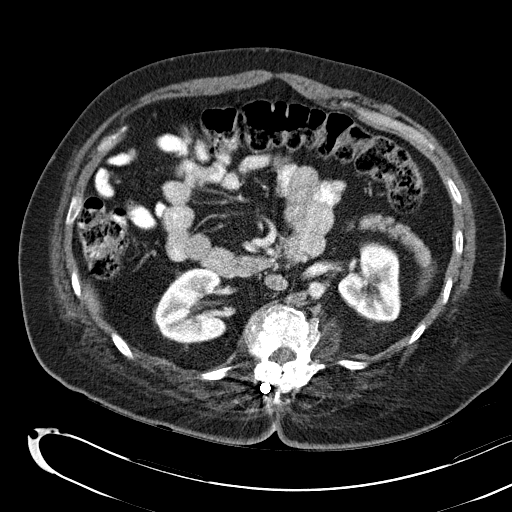
[im 26/52  soft-tissue]
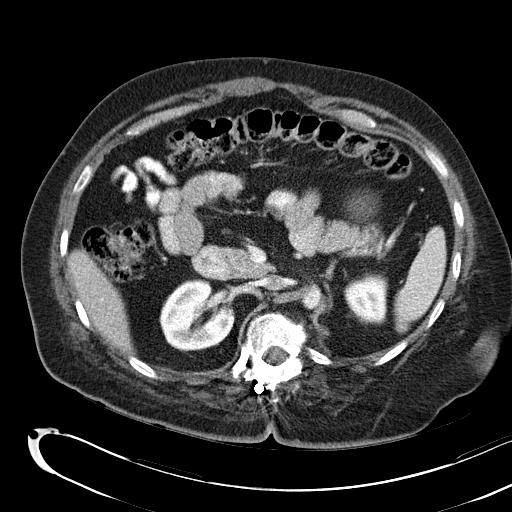
[im 30/52  soft-tissue]
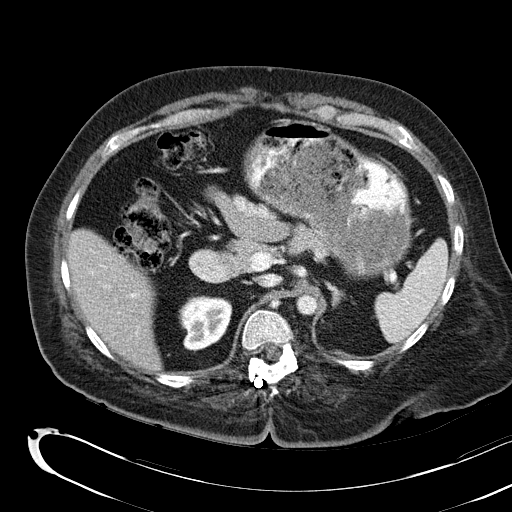
[im 37/52  soft-tissue]
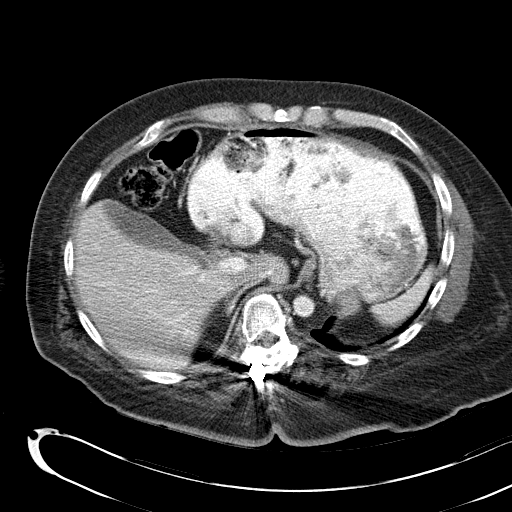
[im 37/52  lung]
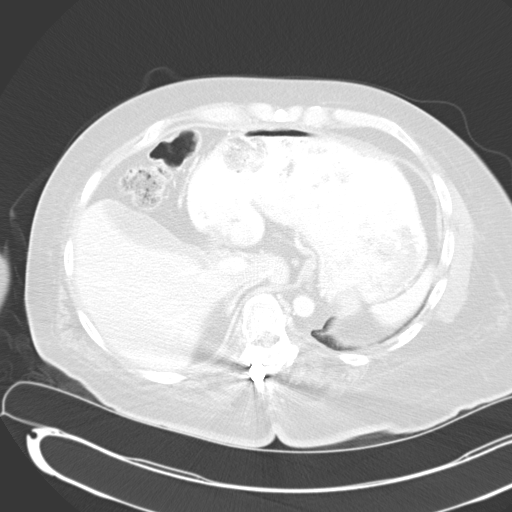
[im 41/52  soft-tissue]
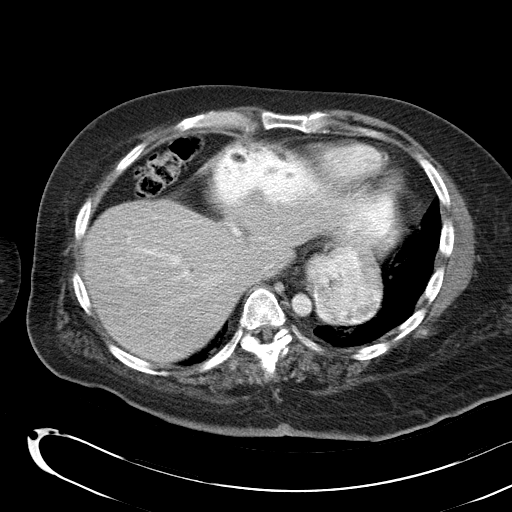
[im 41/52  lung]
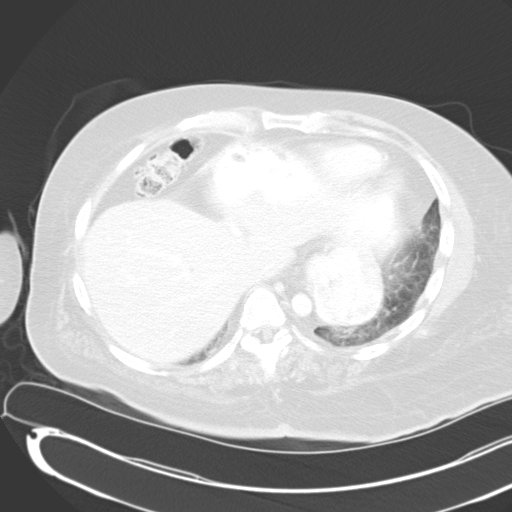
[im 44/52  lung]
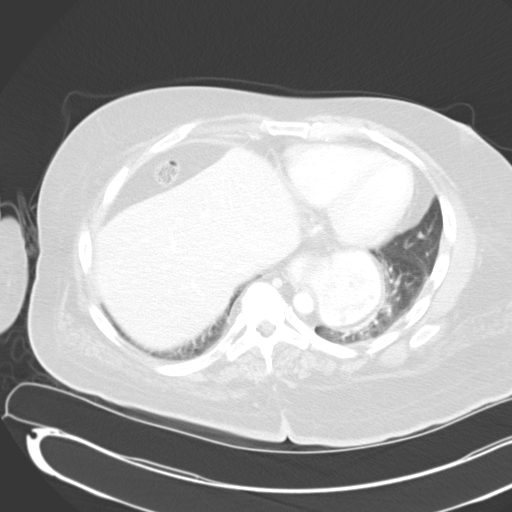
[im 48/52  soft-tissue]
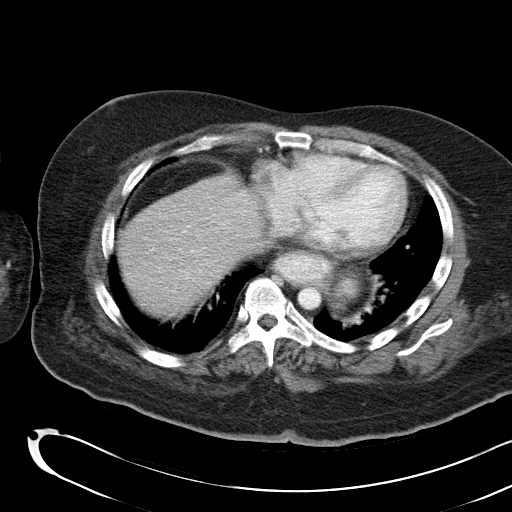
[im 48/52  lung]
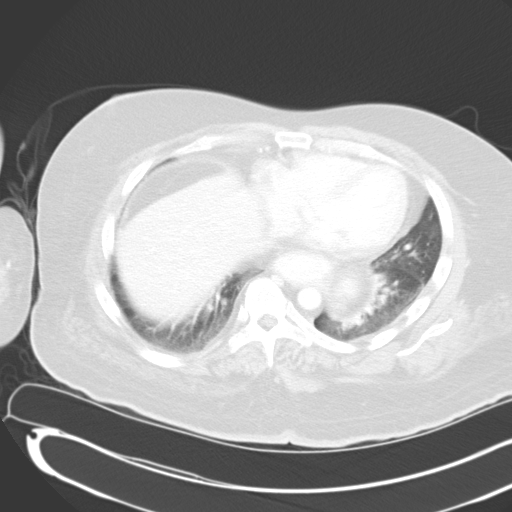
[im 48/52  bone]
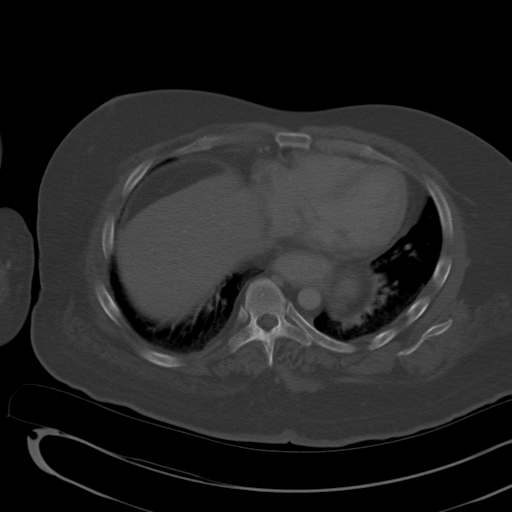

[Series 5: abd_pel_with 3.0 spo cor · coronal · 0.59mm/px · 3 of 91 slices shown]
[im 19/91  soft-tissue]
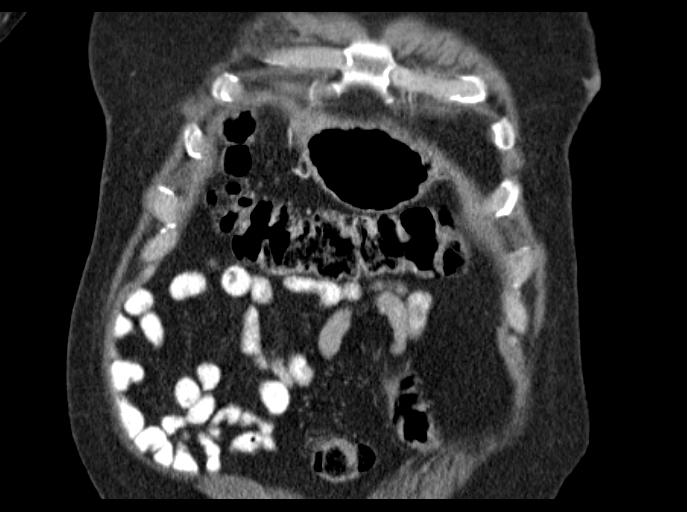
[im 37/91  soft-tissue]
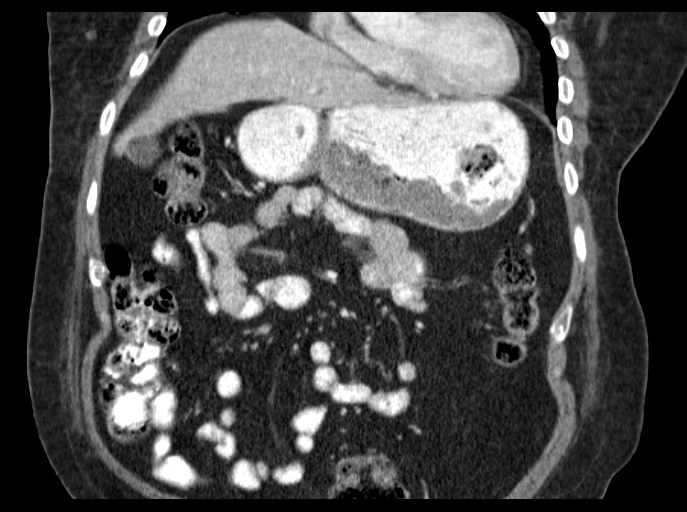
[im 55/91  soft-tissue]
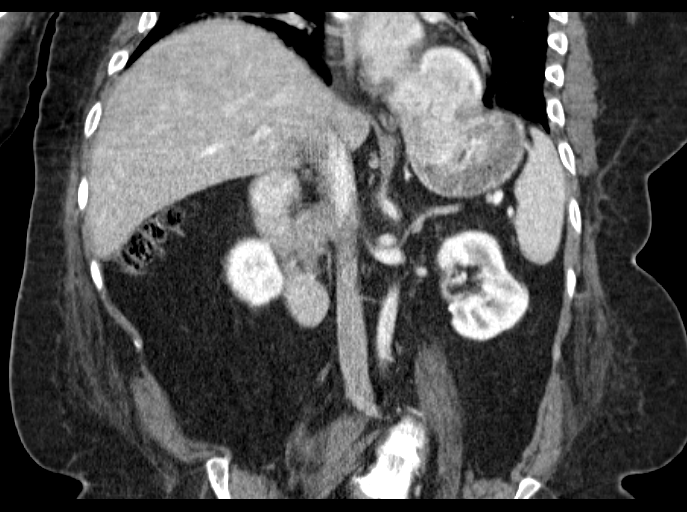

[13 of 46 positions shown; findings below may reference images not displayed]

FINDINGS: Lower chest: Dependent atelectasis at both lung bases. No
significant pleural or pericardial effusion.

Hepatobiliary: The liver is normal in density without focal
abnormality. No evidence of gallstones, gallbladder wall thickening
or biliary dilatation.

Pancreas: Unremarkable. No pancreatic ductal dilatation or
surrounding inflammatory changes.

Spleen: Normal in size without focal abnormality.

Adrenals/Urinary Tract: Both adrenal glands appear normal.There is
cortical scarring in the upper pole of the right kidney. The left
kidney appears normal. There is no hydronephrosis. Bladder not
imaged.

Stomach/Bowel: There is a moderate size hiatal hernia. There is a
large amount of ingested material within the stomach which is
moderately distended. The visualized small bowel, colon and appendix
appear unremarkable.

Vascular/Lymphatic: There are no enlarged abdominal lymph nodes. No
significant vascular findings are present.

Other: No evidence of abdominal wall mass or hernia.

Musculoskeletal: Moderate thoracolumbar scoliosis status post
Harrington rod fixation. The fixation rod is discontinuous at the
thoracolumbar junction. This appears unchanged from chest
radiographs 03/25/2011.
IMPRESSION: 1. No acute abdominal findings.
2. Moderate-size hiatal hernia with distended stomach and retained
ingested material suggesting gastroparesis.
3. Scarring in the upper pole of the right kidney.
4. Scoliosis post rod fixation. The rod is chronically discontinuous
at the thoracolumbar junction.

## 2015-12-06 ENCOUNTER — Other Ambulatory Visit (HOSPITAL_COMMUNITY): Payer: Self-pay

## 2015-12-06 DIAGNOSIS — D751 Secondary polycythemia: Secondary | ICD-10-CM

## 2015-12-09 ENCOUNTER — Encounter (HOSPITAL_COMMUNITY): Payer: Medicare Other | Attending: Oncology | Admitting: Oncology

## 2015-12-09 ENCOUNTER — Encounter (HOSPITAL_COMMUNITY): Payer: Self-pay | Admitting: Oncology

## 2015-12-09 ENCOUNTER — Encounter (HOSPITAL_COMMUNITY): Payer: Medicare Other

## 2015-12-09 VITALS — BP 125/81 | HR 100 | Temp 97.0°F | Resp 16

## 2015-12-09 DIAGNOSIS — D751 Secondary polycythemia: Secondary | ICD-10-CM | POA: Diagnosis not present

## 2015-12-09 LAB — CBC WITH DIFFERENTIAL/PLATELET
BASOS ABS: 0 10*3/uL (ref 0.0–0.1)
BASOS PCT: 1 %
EOS PCT: 5 %
Eosinophils Absolute: 0.3 10*3/uL (ref 0.0–0.7)
HEMATOCRIT: 50.8 % (ref 39.0–52.0)
Hemoglobin: 16.7 g/dL (ref 13.0–17.0)
LYMPHS PCT: 19 %
Lymphs Abs: 1.2 10*3/uL (ref 0.7–4.0)
MCH: 30.1 pg (ref 26.0–34.0)
MCHC: 32.9 g/dL (ref 30.0–36.0)
MCV: 91.7 fL (ref 78.0–100.0)
MONO ABS: 0.5 10*3/uL (ref 0.1–1.0)
Monocytes Relative: 8 %
NEUTROS ABS: 4.3 10*3/uL (ref 1.7–7.7)
Neutrophils Relative %: 67 %
PLATELETS: 299 10*3/uL (ref 150–400)
RBC: 5.54 MIL/uL (ref 4.22–5.81)
RDW: 14.4 % (ref 11.5–15.5)
WBC: 6.3 10*3/uL (ref 4.0–10.5)

## 2015-12-09 NOTE — Progress Notes (Signed)
No primary care provider on file. No primary provider on file.  Polycythemia - Plan: CBC with Differential  CURRENT THERAPY: Surveillance  INTERVAL HISTORY: Ian Hurley 49 y.o. male returns for followup of JAK2 V617F mutation negative, Exon 12 and 13 mutation negative, CALR mutation negative Polycythemia with an elevated EPO level.  I personally reviewed and went over laboratory results with the patient.  The results are noted within this dictation.  We will update labs today.  Labs are stable.  He is S/P sleep study.  This report is read by Dr. Merlene Laughter.  He has episodes that are documented revealing hypoxia during sleep.  It is not clearly reported whether the patient has sleep apnea.  There is no recommendation regarding future management as well.    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    Past Medical History  Diagnosis Date  . Cerebral palsy ([place])   . Seizures ([place])   . Anxiety   . Depression   . 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 Erosive esophagitis MAY 2015    HB 6.6 MCV 63 FERRITIN 6  . Duodenitis MAY 2015    HB 6.6 MCV 63 FERRITIN 6  . Polycythemia 05/17/2015  . Anemia     has Anemia; Seizure disorder ([place]); Anxiety; Cerebral palsy ([place]); Depression; Polycythemia; and Constipation on his problem list.     has No Known Allergies.  Current Outpatient Prescriptions on File Prior to Visit  Medication Sig Dispense Refill  . cyclobenzaprine (FLEXERIL) 10 MG tablet Take 10 mg by mouth 3 (three) times daily.    . furosemide (LASIX) 40 MG tablet Take 40 mg by mouth daily.    Marland Kitchen liver oil-zinc oxide (BOUDREAUXS BUTT PASTE) 40 % ointment Apply 1 application topically as needed for irritation.    Marland Kitchen LORazepam (ATIVAN) 1 MG tablet Take 1 mg by mouth every 8 (eight) hours as needed for anxiety.    . pantoprazole (PROTONIX) 40 MG tablet Take 1 tablet (40 mg total) by mouth 2 (two) times daily before a meal. 60 tablet 3    . PHENObarbital (LUMINAL) 30 MG tablet Take 30-60 mg by mouth 2 (two) times daily. 1 tablet in the morning and 2 tablets at bedtime.    . potassium chloride SA (K-DUR,KLOR-CON) 20 MEQ tablet Take 40 mEq by mouth daily.     . QUEtiapine (SEROQUEL) 50 MG tablet Take 50 mg by mouth 2 (two) times daily.    . sertraline (ZOLOFT) 100 MG tablet Take 100 mg by mouth daily.    . traMADol (ULTRAM) 50 MG tablet Take 50 mg by mouth 2 (two) times daily as needed for moderate pain.     . ferrous sulfate 325 (65 FE) MG tablet TAKE 1 TABLET BY MOUTH TWICE DAILY. (Patient not taking: Reported on 12/09/2015) 60 tablet 3   No current facility-administered medications on file prior to visit.    Past Surgical History  Procedure Laterality Date  . Esophagogastroduodenoscopy N/A 05/23/2014    [id number] HH/mild non-erosive gastritis/small gastric polyp  . Flexible sigmoidoscopy N/A 05/23/2014    Procedure: FLEXIBLE SIGMOIDOSCOPY;  Surgeon: Danie Binder, MD;  [facility];  Service: Endoscopy;  Laterality: N/A;  . Colonoscopy N/A 05/24/2014    [id number] rectal polyp otherwise normal    Denies any headaches, dizziness, double vision, fevers, chills, night sweats, nausea, vomiting, diarrhea, constipation, chest pain, heart palpitations, shortness of  breath, blood in stool, black tarry stool, urinary pain, urinary burning, urinary frequency, hematuria.   PHYSICAL EXAMINATION  ECOG PERFORMANCE STATUS: 2 - Symptomatic, <50% confined to bed  Filed Vitals:   12/09/15 1333  BP: 125/81  Pulse: 100  Temp: 97 F (36.1 C)  Resp: 16    GENERAL:alert, no distress, well nourished, well developed, comfortable, cooperative, obese, smiling and in wheelchair, accompanied by his caregivers. SKIN: skin color, texture, turgor are normal, no rashes or significant lesions HEAD: Normocephalic, No masses, lesions, tenderness or abnormalities EYES: normal, PERRLA, EOMI, Conjunctiva are pink and non-injected EARS:  External ears normal OROPHARYNX:lips, buccal mucosa, and tongue normal and mucous membranes are moist  NECK: supple, trachea midline LYMPH:  no palpable lymphadenopathy BREAST:not examined LUNGS: clear to auscultation  HEART: regular rate & rhythm, no murmurs, no gallops, S1 normal and S2 normal ABDOMEN:abdomen soft, non-tender, obese and normal bowel sounds BACK: Back symmetric, no curvature. EXTREMITIES:less then 2 second capillary refill, no skin discoloration, no cyanosis. Deformities associated with cerebral palsy NEURO: alert & oriented x 3 with fluent speech, no focal motor/sensory deficits   LABORATORY DATA: CBC    Component Value Date/Time   WBC 6.3 12/09/2015 1307   RBC 5.54 12/09/2015 1307   HGB 16.7 12/09/2015 1307   HCT 50.8 12/09/2015 1307   PLT 299 12/09/2015 1307   MCV 91.7 12/09/2015 1307   MCH 30.1 12/09/2015 1307   MCHC 32.9 12/09/2015 1307   RDW 14.4 12/09/2015 1307   LYMPHSABS 1.2 12/09/2015 1307   MONOABS 0.5 12/09/2015 1307   EOSABS 0.3 12/09/2015 1307   BASOSABS 0.0 12/09/2015 1307      Chemistry      Component Value Date/Time   NA 137 05/20/2015 0921   K 3.8 05/20/2015 0921   CL 99* 05/20/2015 0921   CO2 30 05/20/2015 0921   BUN 12 05/20/2015 0921   CREATININE 0.50* 05/20/2015 0921      Component Value Date/Time   CALCIUM 8.7* 05/20/2015 0921   ALKPHOS 101 05/20/2015 0921   AST 15 05/20/2015 0921   ALT 16* 05/20/2015 0921   BILITOT 0.5 05/20/2015 0921        PENDING LABS:   RADIOGRAPHIC STUDIES:  No results found.   PATHOLOGY:    ASSESSMENT AND PLAN:  Polycythemia JAK2 V617F mutation negative, Exon 12 and 13 mutation negative, CALR mutation negative Polycythemia with an elevated EPO level.  Sleep study on 08/23/2015 is suggestive of hypoxia while sleeping.  Labs today: CBC diff  Refer to Dr. Merlene Laughter for further evaluation and management of HS hypoxia.  This is likely the cause of the patient's polycythemia.    Labs in 3  months: CBC diff  Return in 3 months for follow-up.  THERAPY PLAN:  We will continue to monitor his blood counts.  No role for therapeutic phlebotomy at this time.    All questions were answered. The patient knows to call the clinic with any problems, questions or concerns. We can certainly see the patient much sooner if necessary.  Patient and plan discussed with Dr. Ancil Linsey and she is in agreement with the aforementioned.   This note is electronically signed by: Doy Mince 12/09/2015 5:18 PM

## 2015-12-09 NOTE — Assessment & Plan Note (Addendum)
JAK2 V617F mutation negative, Exon 12 and 13 mutation negative, CALR mutation negative Polycythemia with an elevated EPO level.  Sleep study on 08/23/2015 is suggestive of hypoxia while sleeping.  Labs today: CBC diff  Refer to Dr. Merlene Laughter for further evaluation and management of HS hypoxia.  This is likely the cause of the patient's polycythemia.    Labs in 3 months: CBC diff  Return in 3 months for follow-up.

## 2015-12-09 NOTE — Patient Instructions (Signed)
[place] at [facility] Discharge Instructions  RECOMMENDATIONS MADE BY THE CONSULTANT AND ANY TEST RESULTS WILL BE SENT TO YOUR REFERRING PHYSICIAN.  Discussion by Robynn Pane, PA-C. Labs are stable Will refer you to see Dr. Merlene Laughter for evaluation and treatment and evaluation of sleep apnea.  Labs and office visit in 3 months.  Thank you for choosing [place] at [facility] to provide your oncology and hematology care.  To afford each patient quality time with our provider, please arrive at least 15 minutes before your scheduled appointment time.    You need to re-schedule your appointment should you arrive 10 or more minutes late.  We strive to give you quality time with our providers, and arriving late affects you and other patients whose appointments are after yours.  Also, if you no show three or more times for appointments you may be dismissed from the clinic at the providers discretion.     Again, thank you for choosing [facility].  Our hope is that these requests will decrease the amount of time that you wait before being seen by our physicians.       _____________________________________________________________  Should you have questions after your visit to [facility], please contact our office at [phone] between the hours of 8:30 a.m. and 4:30 p.m.  Voicemails left after 4:30 p.m. will not be returned until the following business day.  For prescription refill requests, have your pharmacy contact our office.

## 2015-12-12 ENCOUNTER — Encounter (HOSPITAL_COMMUNITY): Payer: Self-pay | Admitting: Lab

## 2015-12-12 NOTE — Progress Notes (Signed)
Referral sent to Dr Merlene Laughter / records faxed on 12/12. Referral for sleep apena

## 2015-12-19 DIAGNOSIS — R000 Tachycardia, unspecified: Secondary | ICD-10-CM | POA: Diagnosis not present

## 2015-12-19 DIAGNOSIS — D45 Polycythemia vera: Secondary | ICD-10-CM | POA: Diagnosis not present

## 2015-12-19 DIAGNOSIS — R569 Unspecified convulsions: Secondary | ICD-10-CM | POA: Diagnosis not present

## 2015-12-19 DIAGNOSIS — G809 Cerebral palsy, unspecified: Secondary | ICD-10-CM | POA: Diagnosis not present

## 2015-12-19 DIAGNOSIS — F419 Anxiety disorder, unspecified: Secondary | ICD-10-CM | POA: Diagnosis not present

## 2015-12-19 DIAGNOSIS — J9691 Respiratory failure, unspecified with hypoxia: Secondary | ICD-10-CM | POA: Diagnosis not present

## 2015-12-19 DIAGNOSIS — F329 Major depressive disorder, single episode, unspecified: Secondary | ICD-10-CM | POA: Diagnosis not present

## 2015-12-19 DIAGNOSIS — I10 Essential (primary) hypertension: Secondary | ICD-10-CM | POA: Diagnosis not present

## 2015-12-23 DIAGNOSIS — R569 Unspecified convulsions: Secondary | ICD-10-CM | POA: Diagnosis not present

## 2015-12-23 DIAGNOSIS — G809 Cerebral palsy, unspecified: Secondary | ICD-10-CM | POA: Diagnosis not present

## 2015-12-23 DIAGNOSIS — I10 Essential (primary) hypertension: Secondary | ICD-10-CM | POA: Diagnosis not present

## 2015-12-23 DIAGNOSIS — R0902 Hypoxemia: Secondary | ICD-10-CM | POA: Diagnosis not present

## 2016-01-05 ENCOUNTER — Encounter: Payer: Self-pay | Admitting: Hematology & Oncology

## 2016-01-17 DIAGNOSIS — G809 Cerebral palsy, unspecified: Secondary | ICD-10-CM | POA: Diagnosis not present

## 2016-01-17 DIAGNOSIS — D45 Polycythemia vera: Secondary | ICD-10-CM | POA: Diagnosis not present

## 2016-01-17 DIAGNOSIS — I10 Essential (primary) hypertension: Secondary | ICD-10-CM | POA: Diagnosis not present

## 2016-01-17 DIAGNOSIS — F329 Major depressive disorder, single episode, unspecified: Secondary | ICD-10-CM | POA: Diagnosis not present

## 2016-01-17 DIAGNOSIS — F419 Anxiety disorder, unspecified: Secondary | ICD-10-CM | POA: Diagnosis not present

## 2016-01-17 DIAGNOSIS — R0902 Hypoxemia: Secondary | ICD-10-CM | POA: Diagnosis not present

## 2016-01-17 DIAGNOSIS — R569 Unspecified convulsions: Secondary | ICD-10-CM | POA: Diagnosis not present

## 2016-01-19 ENCOUNTER — Ambulatory Visit (INDEPENDENT_AMBULATORY_CARE_PROVIDER_SITE_OTHER): Payer: Medicare Other | Admitting: Nurse Practitioner

## 2016-01-19 ENCOUNTER — Encounter: Payer: Self-pay | Admitting: Nurse Practitioner

## 2016-01-19 ENCOUNTER — Ambulatory Visit: Payer: Medicare Other | Admitting: Nurse Practitioner

## 2016-01-19 VITALS — BP 156/90 | HR 107 | Temp 97.0°F | Ht 60.0 in

## 2016-01-19 DIAGNOSIS — K5900 Constipation, unspecified: Secondary | ICD-10-CM | POA: Diagnosis not present

## 2016-01-19 NOTE — Progress Notes (Signed)
Referring Provider: Rosita Fire, MD Primary Care Physician:  Rosita Fire, MD Primary GI:  Dr. Oneida Alar  Chief Complaint  Patient presents with  . Follow-up    HPI:   50 year old male presents for follow-up on anemia and constipation. Last seen in our office 07/19/2015. He has a history of iron deficiency anemia with previous hospital admissions for severe anemia. Currently on iron supplementation and last iron studies at his last visit were all normal. Is being followed by the cancer center for polycythemia at this point. Mutation labs and CT of the abdomen and pelvis were all normal. Recheck of CBC at his last office visit found globin 17.2, hematocrit 51.4. Most recent CBC completed on 12/09/2015 found to be completely normal. His last visit constipation is likely due to iron supplementation, advised him to take daily Lasix stool softener return for evaluation in 6 months.  Today he states he has a bowel movement about every day to every other day. No straining. Denies abdominal pain, N/V, hematochezia, melena. States he feels great. Denies chest pain, dyspnea, dizziness, lightheadedness, syncope, near syncope. Denies any other upper or lower GI symptoms.  Past Medical History  Diagnosis Date  . Cerebral palsy ([place])   . Seizures ([place])   . Anxiety   . Depression   . 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 Erosive esophagitis MAY 2015    HB 6.6 MCV 63 FERRITIN 6  . Duodenitis MAY 2015    HB 6.6 MCV 63 FERRITIN 6  . Polycythemia 05/17/2015  . Anemia     Past Surgical History  Procedure Laterality Date  . Esophagogastroduodenoscopy N/A 05/23/2014    [id number] HH/mild non-erosive gastritis/small gastric polyp  . Flexible sigmoidoscopy N/A 05/23/2014    SLF: formed stool in colon and large amount of liquid stool  . Colonoscopy N/A 05/24/2014    [id number] rectal polyp otherwise normal    Current Outpatient Prescriptions  Medication Sig Dispense Refill  .  cyclobenzaprine (FLEXERIL) 10 MG tablet Take 10 mg by mouth 3 (three) times daily.    . furosemide (LASIX) 40 MG tablet Take 40 mg by mouth daily.    Marland Kitchen liver oil-zinc oxide (BOUDREAUXS BUTT PASTE) 40 % ointment Apply 1 application topically as needed for irritation.    Marland Kitchen LORazepam (ATIVAN) 1 MG tablet Take 1 mg by mouth every 8 (eight) hours as needed for anxiety.    . pantoprazole (PROTONIX) 40 MG tablet Take 1 tablet (40 mg total) by mouth 2 (two) times daily before a meal. 60 tablet 3  . PHENObarbital (LUMINAL) 30 MG tablet Take 30-60 mg by mouth 2 (two) times daily. 1 tablet in the morning and 2 tablets at bedtime.    . potassium chloride SA (K-DUR,KLOR-CON) 20 MEQ tablet Take 40 mEq by mouth daily.     . QUEtiapine (SEROQUEL) 50 MG tablet Take 50 mg by mouth 2 (two) times daily.    . sertraline (ZOLOFT) 100 MG tablet Take 100 mg by mouth daily.    . traMADol (ULTRAM) 50 MG tablet Take 50 mg by mouth 2 (two) times daily as needed for moderate pain.     . ferrous sulfate 325 (65 FE) MG tablet TAKE 1 TABLET BY MOUTH TWICE DAILY. (Patient not taking: Reported on 12/09/2015) 60 tablet 3   No current facility-administered medications for this visit.    Allergies as of 01/19/2016  . (No Known Allergies)    No family history on  file.  Social History   Social History  . Marital Status: Single    Spouse Name: N/A  . Number of Children: N/A  . Years of Education: N/A   Social History Main Topics  . Smoking status: Former Smoker    Types: Cigarettes    Quit date: 01/16/2014  . Smokeless tobacco: None     Comment: Former smoker  . Alcohol Use: No  . Drug Use: No  . Sexual Activity: Not Asked   Other Topics Concern  . None   Social History Narrative    Review of Systems: 10-point ROS negative except as per HPI.   Physical Exam: BP 156/90 mmHg  Pulse 107  Temp(Src) 97 F (36.1 C)  Ht 5' (1.524 m)  Wt  General:   Alert and oriented. Pleasant and cooperative. Well-nourished  and well-developed. In a wheel-chair Head:  Normocephalic and atraumatic. Cardiovascular:  S1, S2 present without murmurs appreciated. Extremities without clubbing or edema. Respiratory:  Clear to auscultation bilaterally. No wheezes, rales, or rhonchi. No distress.  Gastrointestinal:  +BS, rounded but soft, non-tender and non-distended. No HSM noted. No guarding or rebound. No masses appreciated.  Rectal:  Deferred  Psych:  Alert and cooperative. Normal mood and affect.    01/19/2016 10:54 AM

## 2016-01-19 NOTE — Assessment & Plan Note (Signed)
She previously was constipation likely due to iron supplementation. His irons appears to been stopped at this point. His hemoglobin is normal as of last check. He continues to take Colace stool softener once a day and is having a bowel movement every day to every other day. Denies constipation symptoms such as hard stools, straining, and incomplete emptying. Continue taking Colace daily him return for follow-up as needed.

## 2016-01-19 NOTE — Patient Instructions (Signed)
1. Continue taking Colace stool softener daily. 2. Return for follow-up as needed for any new symptoms or returning/worsening symptoms.

## 2016-01-20 NOTE — Progress Notes (Signed)
cc'ed to pcp °

## 2016-02-08 DIAGNOSIS — G809 Cerebral palsy, unspecified: Secondary | ICD-10-CM | POA: Diagnosis not present

## 2016-02-08 DIAGNOSIS — R600 Localized edema: Secondary | ICD-10-CM | POA: Diagnosis not present

## 2016-02-08 DIAGNOSIS — R569 Unspecified convulsions: Secondary | ICD-10-CM | POA: Diagnosis not present

## 2016-02-28 DIAGNOSIS — F329 Major depressive disorder, single episode, unspecified: Secondary | ICD-10-CM | POA: Diagnosis not present

## 2016-02-28 DIAGNOSIS — R569 Unspecified convulsions: Secondary | ICD-10-CM | POA: Diagnosis not present

## 2016-02-28 DIAGNOSIS — F419 Anxiety disorder, unspecified: Secondary | ICD-10-CM | POA: Diagnosis not present

## 2016-02-28 DIAGNOSIS — R000 Tachycardia, unspecified: Secondary | ICD-10-CM | POA: Diagnosis not present

## 2016-02-28 DIAGNOSIS — R0902 Hypoxemia: Secondary | ICD-10-CM | POA: Diagnosis not present

## 2016-02-28 DIAGNOSIS — D45 Polycythemia vera: Secondary | ICD-10-CM | POA: Diagnosis not present

## 2016-02-28 DIAGNOSIS — G809 Cerebral palsy, unspecified: Secondary | ICD-10-CM | POA: Diagnosis not present

## 2016-02-28 DIAGNOSIS — I10 Essential (primary) hypertension: Secondary | ICD-10-CM | POA: Diagnosis not present

## 2016-03-08 ENCOUNTER — Encounter (HOSPITAL_COMMUNITY): Payer: Self-pay | Admitting: Hematology & Oncology

## 2016-03-08 ENCOUNTER — Encounter (HOSPITAL_COMMUNITY): Payer: Medicare Other

## 2016-03-08 ENCOUNTER — Encounter (HOSPITAL_COMMUNITY): Payer: Medicare Other | Attending: Oncology | Admitting: Hematology & Oncology

## 2016-03-08 VITALS — BP 126/87 | HR 113 | Resp 18

## 2016-03-08 DIAGNOSIS — Z8719 Personal history of other diseases of the digestive system: Secondary | ICD-10-CM | POA: Diagnosis not present

## 2016-03-08 DIAGNOSIS — D751 Secondary polycythemia: Secondary | ICD-10-CM | POA: Diagnosis not present

## 2016-03-08 DIAGNOSIS — D500 Iron deficiency anemia secondary to blood loss (chronic): Secondary | ICD-10-CM | POA: Diagnosis not present

## 2016-03-08 LAB — CBC WITH DIFFERENTIAL/PLATELET
BASOS ABS: 0 10*3/uL (ref 0.0–0.1)
Basophils Relative: 1 %
Eosinophils Absolute: 0.3 10*3/uL (ref 0.0–0.7)
Eosinophils Relative: 5 %
HEMATOCRIT: 39.7 % (ref 39.0–52.0)
Hemoglobin: 11.8 g/dL — ABNORMAL LOW (ref 13.0–17.0)
LYMPHS PCT: 24 %
Lymphs Abs: 1.5 10*3/uL (ref 0.7–4.0)
MCH: 24.6 pg — ABNORMAL LOW (ref 26.0–34.0)
MCHC: 29.7 g/dL — ABNORMAL LOW (ref 30.0–36.0)
MCV: 82.9 fL (ref 78.0–100.0)
Monocytes Absolute: 0.7 10*3/uL (ref 0.1–1.0)
Monocytes Relative: 11 %
NEUTROS ABS: 3.7 10*3/uL (ref 1.7–7.7)
Neutrophils Relative %: 59 %
Platelets: 344 10*3/uL (ref 150–400)
RBC: 4.79 MIL/uL (ref 4.22–5.81)
RDW: 16 % — ABNORMAL HIGH (ref 11.5–15.5)
WBC: 6.2 10*3/uL (ref 4.0–10.5)

## 2016-03-08 LAB — IRON AND TIBC
Iron: 23 ug/dL — ABNORMAL LOW (ref 45–182)
SATURATION RATIOS: 6 % — AB (ref 17.9–39.5)
TIBC: 414 ug/dL (ref 250–450)
UIBC: 391 ug/dL

## 2016-03-08 LAB — FERRITIN: Ferritin: 25 ng/mL (ref 24–336)

## 2016-03-08 NOTE — Patient Instructions (Addendum)
[place] at [facility] Discharge Instructions  RECOMMENDATIONS MADE BY THE CONSULTANT AND ANY TEST RESULTS WILL BE SENT TO YOUR REFERRING PHYSICIAN.   Exam and discussion by Dr Whitney Muse today Your hemoglobin was 11.8 today Labs today iron and ferritin, we will call you with those results  If he starts feeling weak, has SOB, notices any bleeding from any where please call us Lab work in 4 weeks  Return to see the doctor in 4 weeks Please call the clinic if you have any questions or concerns    Thank you for choosing [place] at [facility] to provide your oncology and hematology care.  To afford each patient quality time with our provider, please arrive at least 15 minutes before your scheduled appointment time.   Beginning January 23rd 2017 lab work for the [facility] will be done in the  Main lab at [facility] on 1st floor. If you have a lab appointment with the [facility] please come in thru the  Main Entrance and check in at the main information desk  You need to re-schedule your appointment should you arrive 10 or more minutes late.  We strive to give you quality time with our providers, and arriving late affects you and other patients whose appointments are after yours.  Also, if you no show three or more times for appointments you may be dismissed from the clinic at the providers discretion.     Again, thank you for choosing [facility].  Our hope is that these requests will decrease the amount of time that you wait before being seen by our physicians.       _____________________________________________________________  Should you have questions after your visit to [facility], please contact our office at [phone] between the hours of 8:30 a.m. and 4:30 p.m.  Voicemails left after 4:30 p.m. will not be returned until the following business day.  For prescription refill requests, have your  pharmacy contact our office.         Resources For Cancer Patients and their Caregivers ? American Cancer Society: Can assist with transportation, wigs, general needs, runs Look Good Feel Better.        225-302-9183 ? Cancer Care: Provides financial assistance, online support groups, medication/co-pay assistance.  1-800-813-HOPE 309-349-8130) ? [facility] Assists [place] cancer patients and their families through emotional , educational and financial support.  (716)157-3627 ? [place] DSS Where to apply for food stamps, Medicaid and utility assistance. 530-134-4141 ? [facility]: Transportation to medical appointments. 859-471-0614 ? Social Security Administration: May apply for disability if have a Stage IV cancer. ([phone] 732-260-3494 ? [employer], Disability and Transit Services: Assists with nutrition, care and transit needs. 616-758-7411

## 2016-03-08 NOTE — Progress Notes (Signed)
[facility] Hematology/Oncology Consultation   Name: Ian Hurley      MRN: [id number]   Date: 03/08/2016 Time:3:38 PM   REFERRING PHYSICIAN:  Phillips Odor, MD  REASON FOR CONSULT:  Elevated hemoglobin   DIAGNOSIS:  Elevated hemoglobin with normal WBC and platelet count.  HISTORY OF PRESENT ILLNESS:   Ian Hurley is a 50 year old black American man with a past medical history significant for seizure disorder, depression, cerebral palsy, and anxiety who was referred to the [facility] for elevated hemoglobin and HCT.  Chart reviewed.  In May 2015, the patient was admitted to the [facility] for severe iron deficiency anemia.  He was started on ferrous sulfate.  He was seeing Dr. Merlene Hurley and he noted an elevated Hgb.  Therefore, the patient was referred to [facility].   Ian Hurley was here with a family member and was in a wheelchair.  On 07/21/15 his hemoglobin was high at 17.2 and he had blood work done today and his hemoglobin levels were low at 11.8.   He denies any bleeding or blood in his stool, and blood in his urine. However he says that he might have a little bit of black stools and cannot tell if his stool is sticky or tarry.   He says that he doesn't feel any different. He was tired yesterday but says that overall there has been no change in his tiredness.   He has been eating okay and denies any abdominal pain.  PAST MEDICAL HISTORY:   Past Medical History  Diagnosis Date  . Cerebral palsy ([place])   . Seizures ([place])   . Anxiety   . Depression   . 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 Erosive esophagitis MAY 2015    HB 6.6 MCV 63 FERRITIN 6  . Duodenitis MAY 2015    HB 6.6 MCV 63 FERRITIN 6  . Polycythemia 05/17/2015  . Anemia     ALLERGIES: No Known Allergies    MEDICATIONS: I have reviewed the patient's current medications.    Current Outpatient Prescriptions on File Prior to Visit  Medication Sig Dispense Refill   . cyclobenzaprine (FLEXERIL) 10 MG tablet Take 10 mg by mouth 3 (three) times daily.    . furosemide (LASIX) 40 MG tablet Take 40 mg by mouth daily.    Marland Kitchen liver oil-zinc oxide (BOUDREAUXS BUTT PASTE) 40 % ointment Apply 1 application topically as needed for irritation.    Marland Kitchen LORazepam (ATIVAN) 1 MG tablet Take 1 mg by mouth every 8 (eight) hours as needed for anxiety.    . pantoprazole (PROTONIX) 40 MG tablet Take 1 tablet (40 mg total) by mouth 2 (two) times daily before a meal. 60 tablet 3  . PHENObarbital (LUMINAL) 30 MG tablet Take 30-60 mg by mouth 2 (two) times daily. 1 tablet in the morning and 2 tablets at bedtime.    . potassium chloride SA (K-DUR,KLOR-CON) 20 MEQ tablet Take 40 mEq by mouth daily.     . QUEtiapine (SEROQUEL) 50 MG tablet Take 50 mg by mouth 2 (two) times daily.    . sertraline (ZOLOFT) 100 MG tablet Take 100 mg by mouth daily.    . traMADol (ULTRAM) 50 MG tablet Take 50 mg by mouth 2 (two) times daily as needed for moderate pain.      No current facility-administered medications on file prior to visit.  PAST SURGICAL HISTORY Past Surgical History  Procedure Laterality Date  . Esophagogastroduodenoscopy N/A 05/23/2014    [id number] HH/mild non-erosive gastritis/small gastric polyp  . Flexible sigmoidoscopy N/A 05/23/2014    SLF: formed stool in colon and large amount of liquid stool  . Colonoscopy N/A 05/24/2014    [id number] rectal polyp otherwise normal    FAMILY HISTORY: History reviewed. No pertinent family history.  SOCIAL HISTORY:  reports that he quit smoking about 2 years ago. His smoking use included Cigarettes. He does not have any smokeless tobacco history on file. He reports that he does not drink alcohol or use illicit drugs.  PERFORMANCE STATUS: The patient's performance status is 3 - Symptomatic, >50% confined to bed  Vitals with BMI 03/08/2016  Height   Weight   BMI   Systolic [id number]  Diastolic 87  Pulse [id number]  Respirations 18    PHYSICAL  EXAM: Most Recent Vital Signs: Blood pressure 126/87, pulse 113, resp. rate 18, SpO2 100 %. General appearance: alert, cooperative, no distress and moderately obese Head: Normocephalic, without obvious abnormality, atraumatic Eyes: negative findings: lids and lashes normal, conjunctivae and sclerae normal and corneas clear Throat: lips, mucosa, and tongue normal; teeth and gums normal Neck: no adenopathy and supple, symmetrical, trachea midline Lungs: clear to auscultation bilaterally Heart: regular rate and rhythm Abdomen: soft, non-tender; bowel sounds normal; no masses,  no organomegaly Extremities: extremities normal, atraumatic, no cyanosis or edema and contracture of right upper extremity and hand contractures, positive clubbing of fingernails. Skin: Skin color, texture, turgor normal. No rashes or lesions Lymph nodes: Cervical, supraclavicular, and axillary nodes normal. Neurologic: A and O x 3.  Unable to walk.  Signs of cerebral palsy   LABORATORY DATA:  I have reviewed the data as listed.  Results for Ian, Hurley (MRN [id number]) as of 03/08/2016 11:39  Ref. Range 03/08/2016 09:50  WBC Latest Ref Range: 4.0-10.5 K/uL 6.2  RBC Latest Ref Range: 4.22-5.81 MIL/uL 4.79  Hemoglobin Latest Ref Range: 13.0-17.0 g/dL 11.8 (L)  HCT Latest Ref Range: 39.0-52.0 % 39.7  MCV Latest Ref Range: 78.0-100.0 fL 82.9  MCH Latest Ref Range: 26.0-34.0 pg 24.6 (L)  MCHC Latest Ref Range: 30.0-36.0 g/dL 29.7 (L)  RDW Latest Ref Range: 11.5-15.5 % 16.0 (H)  Platelets Latest Ref Range: 150-400 K/uL 344  Neutrophils Latest Units: % 59  Lymphocytes Latest Units: % 24  Monocytes Relative Latest Units: % 11  Eosinophil Latest Units: % 5  Basophil Latest Units: % 1  NEUT# Latest Ref Range: 1.7-7.7 K/uL 3.7  Lymphocyte # Latest Ref Range: 0.7-4.0 K/uL 1.5  Monocyte # Latest Ref Range: 0.1-1.0 K/uL 0.7  Eosinophils Absolute Latest Ref Range: 0.0-0.7 K/uL 0.3  Basophils Absolute Latest Ref Range:  0.0-0.1 K/uL 0.0    JAK 2 [id number] mutation negative, Exon 12 and 13 mutation negative CALR mutation negative   ASSESSMENT:  1. Polycythemia 2. Cerebral Palsy 3. H/O severe iron deficiency anemia requiring transfusion of PRBC 2015 4. Colonoscopy 05/24/2014 hemoccult positive stool, ileocolonoscopy with snare polypectomy 4. Seizure disorder.   PLAN:  Patient has a history of severe iron deficiency anemia requiring packed red cell transfusion. After his iron was replaced. He was noted to be polycythemic. Today on follow-up for his polycythemia. He is noted to have a significant decline in his counts. Iron levels are low. He does have a history of prior GI bleeding. I have recommended close observation given his prior history. We will see him back in 4 weeks with  repeat laboratory studies. We may need to consider replacing his iron. He was advised to call if he experiences increased fatigue, shortness of breath, or weakness. He is also advised to call should he develop any blood in his stool orblack or tarry stool.  He is getting blood work done today.  He will return in 4 weeks for a follow up and to discuss his lab results.  He was advised to return if he gets tired or weak.   All questions were answered. The patient knows to call the clinic with any problems, questions or concerns. We can certainly see the patient much sooner if necessary.   This document serves as a record of services personally performed by Ancil Linsey, MD. It was created on her behalf by Kandace Blitz, a trained medical scribe. The creation of this record is based on the scribe's personal observations and the provider's statements to them. This document has been checked and approved by the attending provider.  I have reviewed the above documentation for accuracy and completeness, and I agree with the above.  Kelby Fam. Whitney Muse, MD

## 2016-03-19 DIAGNOSIS — D45 Polycythemia vera: Secondary | ICD-10-CM | POA: Diagnosis not present

## 2016-03-19 DIAGNOSIS — R569 Unspecified convulsions: Secondary | ICD-10-CM | POA: Diagnosis not present

## 2016-03-19 DIAGNOSIS — F329 Major depressive disorder, single episode, unspecified: Secondary | ICD-10-CM | POA: Diagnosis not present

## 2016-03-19 DIAGNOSIS — G809 Cerebral palsy, unspecified: Secondary | ICD-10-CM | POA: Diagnosis not present

## 2016-03-19 DIAGNOSIS — F419 Anxiety disorder, unspecified: Secondary | ICD-10-CM | POA: Diagnosis not present

## 2016-03-19 DIAGNOSIS — R000 Tachycardia, unspecified: Secondary | ICD-10-CM | POA: Diagnosis not present

## 2016-03-19 DIAGNOSIS — I10 Essential (primary) hypertension: Secondary | ICD-10-CM | POA: Diagnosis not present

## 2016-03-19 DIAGNOSIS — Z79899 Other long term (current) drug therapy: Secondary | ICD-10-CM | POA: Diagnosis not present

## 2016-03-22 ENCOUNTER — Other Ambulatory Visit (HOSPITAL_COMMUNITY): Payer: Self-pay | Admitting: *Deleted

## 2016-03-22 MED ORDER — IRON POLYSACCH CMPLX-B12-FA 150-0.025-1 MG PO CAPS: 1.0000 | ORAL_CAPSULE | Freq: Every day | ORAL | Status: DC

## 2016-03-26 DIAGNOSIS — M79604 Pain in right leg: Secondary | ICD-10-CM | POA: Diagnosis not present

## 2016-03-26 DIAGNOSIS — L89322 Pressure ulcer of left buttock, stage 2: Secondary | ICD-10-CM | POA: Diagnosis not present

## 2016-03-27 DIAGNOSIS — F329 Major depressive disorder, single episode, unspecified: Secondary | ICD-10-CM | POA: Diagnosis not present

## 2016-03-27 DIAGNOSIS — L89892 Pressure ulcer of other site, stage 2: Secondary | ICD-10-CM | POA: Diagnosis not present

## 2016-03-27 DIAGNOSIS — G40909 Epilepsy, unspecified, not intractable, without status epilepticus: Secondary | ICD-10-CM | POA: Diagnosis not present

## 2016-03-27 DIAGNOSIS — F419 Anxiety disorder, unspecified: Secondary | ICD-10-CM | POA: Diagnosis not present

## 2016-03-27 DIAGNOSIS — R600 Localized edema: Secondary | ICD-10-CM | POA: Diagnosis not present

## 2016-03-27 DIAGNOSIS — G809 Cerebral palsy, unspecified: Secondary | ICD-10-CM | POA: Diagnosis not present

## 2016-03-31 DIAGNOSIS — F419 Anxiety disorder, unspecified: Secondary | ICD-10-CM | POA: Diagnosis not present

## 2016-03-31 DIAGNOSIS — L89892 Pressure ulcer of other site, stage 2: Secondary | ICD-10-CM | POA: Diagnosis not present

## 2016-03-31 DIAGNOSIS — R600 Localized edema: Secondary | ICD-10-CM | POA: Diagnosis not present

## 2016-03-31 DIAGNOSIS — G40909 Epilepsy, unspecified, not intractable, without status epilepticus: Secondary | ICD-10-CM | POA: Diagnosis not present

## 2016-03-31 DIAGNOSIS — F329 Major depressive disorder, single episode, unspecified: Secondary | ICD-10-CM | POA: Diagnosis not present

## 2016-03-31 DIAGNOSIS — G809 Cerebral palsy, unspecified: Secondary | ICD-10-CM | POA: Diagnosis not present

## 2016-04-03 DIAGNOSIS — F329 Major depressive disorder, single episode, unspecified: Secondary | ICD-10-CM | POA: Diagnosis not present

## 2016-04-03 DIAGNOSIS — G40909 Epilepsy, unspecified, not intractable, without status epilepticus: Secondary | ICD-10-CM | POA: Diagnosis not present

## 2016-04-03 DIAGNOSIS — G809 Cerebral palsy, unspecified: Secondary | ICD-10-CM | POA: Diagnosis not present

## 2016-04-03 DIAGNOSIS — F419 Anxiety disorder, unspecified: Secondary | ICD-10-CM | POA: Diagnosis not present

## 2016-04-03 DIAGNOSIS — L89892 Pressure ulcer of other site, stage 2: Secondary | ICD-10-CM | POA: Diagnosis not present

## 2016-04-03 DIAGNOSIS — R600 Localized edema: Secondary | ICD-10-CM | POA: Diagnosis not present

## 2016-04-06 DIAGNOSIS — R600 Localized edema: Secondary | ICD-10-CM | POA: Diagnosis not present

## 2016-04-06 DIAGNOSIS — G809 Cerebral palsy, unspecified: Secondary | ICD-10-CM | POA: Diagnosis not present

## 2016-04-06 DIAGNOSIS — F419 Anxiety disorder, unspecified: Secondary | ICD-10-CM | POA: Diagnosis not present

## 2016-04-06 DIAGNOSIS — L89892 Pressure ulcer of other site, stage 2: Secondary | ICD-10-CM | POA: Diagnosis not present

## 2016-04-06 DIAGNOSIS — F329 Major depressive disorder, single episode, unspecified: Secondary | ICD-10-CM | POA: Diagnosis not present

## 2016-04-06 DIAGNOSIS — G40909 Epilepsy, unspecified, not intractable, without status epilepticus: Secondary | ICD-10-CM | POA: Diagnosis not present

## 2016-04-09 NOTE — Assessment & Plan Note (Deleted)
History of iron deficiency anemia secondary to GI blood loss requiring packed red blood cell transfusions in the past.  Laboratory work today: Ferritin.

## 2016-04-09 NOTE — Progress Notes (Signed)
No Show

## 2016-04-10 ENCOUNTER — Other Ambulatory Visit (HOSPITAL_COMMUNITY): Payer: Medicare Other

## 2016-04-10 ENCOUNTER — Ambulatory Visit (HOSPITAL_COMMUNITY): Payer: Medicare Other | Admitting: Oncology

## 2016-04-10 DIAGNOSIS — G809 Cerebral palsy, unspecified: Secondary | ICD-10-CM | POA: Diagnosis not present

## 2016-04-10 DIAGNOSIS — L89892 Pressure ulcer of other site, stage 2: Secondary | ICD-10-CM | POA: Diagnosis not present

## 2016-04-10 DIAGNOSIS — F419 Anxiety disorder, unspecified: Secondary | ICD-10-CM | POA: Diagnosis not present

## 2016-04-10 DIAGNOSIS — R600 Localized edema: Secondary | ICD-10-CM | POA: Diagnosis not present

## 2016-04-10 DIAGNOSIS — G40909 Epilepsy, unspecified, not intractable, without status epilepticus: Secondary | ICD-10-CM | POA: Diagnosis not present

## 2016-04-10 DIAGNOSIS — F329 Major depressive disorder, single episode, unspecified: Secondary | ICD-10-CM | POA: Diagnosis not present

## 2016-04-13 DIAGNOSIS — G40909 Epilepsy, unspecified, not intractable, without status epilepticus: Secondary | ICD-10-CM | POA: Diagnosis not present

## 2016-04-13 DIAGNOSIS — F329 Major depressive disorder, single episode, unspecified: Secondary | ICD-10-CM | POA: Diagnosis not present

## 2016-04-13 DIAGNOSIS — L89892 Pressure ulcer of other site, stage 2: Secondary | ICD-10-CM | POA: Diagnosis not present

## 2016-04-13 DIAGNOSIS — G809 Cerebral palsy, unspecified: Secondary | ICD-10-CM | POA: Diagnosis not present

## 2016-04-13 DIAGNOSIS — R600 Localized edema: Secondary | ICD-10-CM | POA: Diagnosis not present

## 2016-04-13 DIAGNOSIS — F419 Anxiety disorder, unspecified: Secondary | ICD-10-CM | POA: Diagnosis not present

## 2016-04-16 DIAGNOSIS — R600 Localized edema: Secondary | ICD-10-CM | POA: Diagnosis not present

## 2016-04-16 DIAGNOSIS — L89892 Pressure ulcer of other site, stage 2: Secondary | ICD-10-CM | POA: Diagnosis not present

## 2016-04-16 DIAGNOSIS — F419 Anxiety disorder, unspecified: Secondary | ICD-10-CM | POA: Diagnosis not present

## 2016-04-16 DIAGNOSIS — G40909 Epilepsy, unspecified, not intractable, without status epilepticus: Secondary | ICD-10-CM | POA: Diagnosis not present

## 2016-04-16 DIAGNOSIS — F329 Major depressive disorder, single episode, unspecified: Secondary | ICD-10-CM | POA: Diagnosis not present

## 2016-04-16 DIAGNOSIS — G809 Cerebral palsy, unspecified: Secondary | ICD-10-CM | POA: Diagnosis not present

## 2016-04-19 DIAGNOSIS — G809 Cerebral palsy, unspecified: Secondary | ICD-10-CM | POA: Diagnosis not present

## 2016-04-19 DIAGNOSIS — F419 Anxiety disorder, unspecified: Secondary | ICD-10-CM | POA: Diagnosis not present

## 2016-04-19 DIAGNOSIS — F329 Major depressive disorder, single episode, unspecified: Secondary | ICD-10-CM | POA: Diagnosis not present

## 2016-04-19 DIAGNOSIS — R600 Localized edema: Secondary | ICD-10-CM | POA: Diagnosis not present

## 2016-04-19 DIAGNOSIS — L89892 Pressure ulcer of other site, stage 2: Secondary | ICD-10-CM | POA: Diagnosis not present

## 2016-04-19 DIAGNOSIS — G40909 Epilepsy, unspecified, not intractable, without status epilepticus: Secondary | ICD-10-CM | POA: Diagnosis not present

## 2016-04-24 DIAGNOSIS — G809 Cerebral palsy, unspecified: Secondary | ICD-10-CM | POA: Diagnosis not present

## 2016-04-24 DIAGNOSIS — R600 Localized edema: Secondary | ICD-10-CM | POA: Diagnosis not present

## 2016-04-24 DIAGNOSIS — F419 Anxiety disorder, unspecified: Secondary | ICD-10-CM | POA: Diagnosis not present

## 2016-04-24 DIAGNOSIS — F329 Major depressive disorder, single episode, unspecified: Secondary | ICD-10-CM | POA: Diagnosis not present

## 2016-04-24 DIAGNOSIS — L89892 Pressure ulcer of other site, stage 2: Secondary | ICD-10-CM | POA: Diagnosis not present

## 2016-04-24 DIAGNOSIS — G40909 Epilepsy, unspecified, not intractable, without status epilepticus: Secondary | ICD-10-CM | POA: Diagnosis not present

## 2016-04-27 ENCOUNTER — Other Ambulatory Visit (HOSPITAL_COMMUNITY): Payer: Self-pay | Admitting: Hematology & Oncology

## 2016-05-02 DIAGNOSIS — F419 Anxiety disorder, unspecified: Secondary | ICD-10-CM | POA: Diagnosis not present

## 2016-05-02 DIAGNOSIS — G809 Cerebral palsy, unspecified: Secondary | ICD-10-CM | POA: Diagnosis not present

## 2016-05-02 DIAGNOSIS — F329 Major depressive disorder, single episode, unspecified: Secondary | ICD-10-CM | POA: Diagnosis not present

## 2016-05-02 DIAGNOSIS — G40909 Epilepsy, unspecified, not intractable, without status epilepticus: Secondary | ICD-10-CM | POA: Diagnosis not present

## 2016-05-02 DIAGNOSIS — R600 Localized edema: Secondary | ICD-10-CM | POA: Diagnosis not present

## 2016-05-02 DIAGNOSIS — L89892 Pressure ulcer of other site, stage 2: Secondary | ICD-10-CM | POA: Diagnosis not present

## 2016-05-08 DIAGNOSIS — G809 Cerebral palsy, unspecified: Secondary | ICD-10-CM | POA: Diagnosis not present

## 2016-05-08 DIAGNOSIS — G40909 Epilepsy, unspecified, not intractable, without status epilepticus: Secondary | ICD-10-CM | POA: Diagnosis not present

## 2016-05-08 DIAGNOSIS — R600 Localized edema: Secondary | ICD-10-CM | POA: Diagnosis not present

## 2016-05-08 DIAGNOSIS — F419 Anxiety disorder, unspecified: Secondary | ICD-10-CM | POA: Diagnosis not present

## 2016-05-08 DIAGNOSIS — L89892 Pressure ulcer of other site, stage 2: Secondary | ICD-10-CM | POA: Diagnosis not present

## 2016-05-08 DIAGNOSIS — F329 Major depressive disorder, single episode, unspecified: Secondary | ICD-10-CM | POA: Diagnosis not present

## 2016-05-18 DIAGNOSIS — R600 Localized edema: Secondary | ICD-10-CM | POA: Diagnosis not present

## 2016-05-18 DIAGNOSIS — F329 Major depressive disorder, single episode, unspecified: Secondary | ICD-10-CM | POA: Diagnosis not present

## 2016-05-18 DIAGNOSIS — L89892 Pressure ulcer of other site, stage 2: Secondary | ICD-10-CM | POA: Diagnosis not present

## 2016-05-18 DIAGNOSIS — G40909 Epilepsy, unspecified, not intractable, without status epilepticus: Secondary | ICD-10-CM | POA: Diagnosis not present

## 2016-05-18 DIAGNOSIS — G809 Cerebral palsy, unspecified: Secondary | ICD-10-CM | POA: Diagnosis not present

## 2016-05-18 DIAGNOSIS — F419 Anxiety disorder, unspecified: Secondary | ICD-10-CM | POA: Diagnosis not present

## 2016-05-25 DIAGNOSIS — L89892 Pressure ulcer of other site, stage 2: Secondary | ICD-10-CM | POA: Diagnosis not present

## 2016-05-25 DIAGNOSIS — R600 Localized edema: Secondary | ICD-10-CM | POA: Diagnosis not present

## 2016-05-25 DIAGNOSIS — G40909 Epilepsy, unspecified, not intractable, without status epilepticus: Secondary | ICD-10-CM | POA: Diagnosis not present

## 2016-05-25 DIAGNOSIS — G809 Cerebral palsy, unspecified: Secondary | ICD-10-CM | POA: Diagnosis not present

## 2016-05-25 DIAGNOSIS — F329 Major depressive disorder, single episode, unspecified: Secondary | ICD-10-CM | POA: Diagnosis not present

## 2016-05-25 DIAGNOSIS — F419 Anxiety disorder, unspecified: Secondary | ICD-10-CM | POA: Diagnosis not present

## 2016-05-26 DIAGNOSIS — L988 Other specified disorders of the skin and subcutaneous tissue: Secondary | ICD-10-CM | POA: Diagnosis not present

## 2016-05-26 DIAGNOSIS — G40909 Epilepsy, unspecified, not intractable, without status epilepticus: Secondary | ICD-10-CM | POA: Diagnosis not present

## 2016-05-26 DIAGNOSIS — R600 Localized edema: Secondary | ICD-10-CM | POA: Diagnosis not present

## 2016-05-26 DIAGNOSIS — G809 Cerebral palsy, unspecified: Secondary | ICD-10-CM | POA: Diagnosis not present

## 2016-05-26 DIAGNOSIS — F329 Major depressive disorder, single episode, unspecified: Secondary | ICD-10-CM | POA: Diagnosis not present

## 2016-05-26 DIAGNOSIS — F419 Anxiety disorder, unspecified: Secondary | ICD-10-CM | POA: Diagnosis not present

## 2016-05-30 DIAGNOSIS — R600 Localized edema: Secondary | ICD-10-CM | POA: Diagnosis not present

## 2016-05-30 DIAGNOSIS — L988 Other specified disorders of the skin and subcutaneous tissue: Secondary | ICD-10-CM | POA: Diagnosis not present

## 2016-05-30 DIAGNOSIS — G809 Cerebral palsy, unspecified: Secondary | ICD-10-CM | POA: Diagnosis not present

## 2016-05-30 DIAGNOSIS — G40909 Epilepsy, unspecified, not intractable, without status epilepticus: Secondary | ICD-10-CM | POA: Diagnosis not present

## 2016-05-30 DIAGNOSIS — F329 Major depressive disorder, single episode, unspecified: Secondary | ICD-10-CM | POA: Diagnosis not present

## 2016-05-30 DIAGNOSIS — F419 Anxiety disorder, unspecified: Secondary | ICD-10-CM | POA: Diagnosis not present

## 2016-06-06 DIAGNOSIS — G40909 Epilepsy, unspecified, not intractable, without status epilepticus: Secondary | ICD-10-CM | POA: Diagnosis not present

## 2016-06-06 DIAGNOSIS — F419 Anxiety disorder, unspecified: Secondary | ICD-10-CM | POA: Diagnosis not present

## 2016-06-06 DIAGNOSIS — G809 Cerebral palsy, unspecified: Secondary | ICD-10-CM | POA: Diagnosis not present

## 2016-06-06 DIAGNOSIS — F329 Major depressive disorder, single episode, unspecified: Secondary | ICD-10-CM | POA: Diagnosis not present

## 2016-06-06 DIAGNOSIS — R600 Localized edema: Secondary | ICD-10-CM | POA: Diagnosis not present

## 2016-06-06 DIAGNOSIS — L988 Other specified disorders of the skin and subcutaneous tissue: Secondary | ICD-10-CM | POA: Diagnosis not present

## 2016-06-11 DIAGNOSIS — L0292 Furuncle, unspecified: Secondary | ICD-10-CM | POA: Diagnosis not present

## 2016-06-11 DIAGNOSIS — R292 Abnormal reflex: Secondary | ICD-10-CM | POA: Diagnosis not present

## 2016-06-11 DIAGNOSIS — G4089 Other seizures: Secondary | ICD-10-CM | POA: Diagnosis not present

## 2016-06-11 DIAGNOSIS — F419 Anxiety disorder, unspecified: Secondary | ICD-10-CM | POA: Diagnosis not present

## 2016-06-11 DIAGNOSIS — F800 Phonological disorder: Secondary | ICD-10-CM | POA: Diagnosis not present

## 2016-06-11 DIAGNOSIS — Z7409 Other reduced mobility: Secondary | ICD-10-CM | POA: Diagnosis not present

## 2016-06-13 DIAGNOSIS — F329 Major depressive disorder, single episode, unspecified: Secondary | ICD-10-CM | POA: Diagnosis not present

## 2016-06-13 DIAGNOSIS — R600 Localized edema: Secondary | ICD-10-CM | POA: Diagnosis not present

## 2016-06-13 DIAGNOSIS — G40909 Epilepsy, unspecified, not intractable, without status epilepticus: Secondary | ICD-10-CM | POA: Diagnosis not present

## 2016-06-13 DIAGNOSIS — G809 Cerebral palsy, unspecified: Secondary | ICD-10-CM | POA: Diagnosis not present

## 2016-06-13 DIAGNOSIS — F419 Anxiety disorder, unspecified: Secondary | ICD-10-CM | POA: Diagnosis not present

## 2016-06-13 DIAGNOSIS — L988 Other specified disorders of the skin and subcutaneous tissue: Secondary | ICD-10-CM | POA: Diagnosis not present

## 2016-06-20 DIAGNOSIS — R600 Localized edema: Secondary | ICD-10-CM | POA: Diagnosis not present

## 2016-06-20 DIAGNOSIS — L988 Other specified disorders of the skin and subcutaneous tissue: Secondary | ICD-10-CM | POA: Diagnosis not present

## 2016-06-20 DIAGNOSIS — G40909 Epilepsy, unspecified, not intractable, without status epilepticus: Secondary | ICD-10-CM | POA: Diagnosis not present

## 2016-06-20 DIAGNOSIS — F329 Major depressive disorder, single episode, unspecified: Secondary | ICD-10-CM | POA: Diagnosis not present

## 2016-06-20 DIAGNOSIS — F419 Anxiety disorder, unspecified: Secondary | ICD-10-CM | POA: Diagnosis not present

## 2016-06-20 DIAGNOSIS — G809 Cerebral palsy, unspecified: Secondary | ICD-10-CM | POA: Diagnosis not present

## 2016-06-27 DIAGNOSIS — G40909 Epilepsy, unspecified, not intractable, without status epilepticus: Secondary | ICD-10-CM | POA: Diagnosis not present

## 2016-06-27 DIAGNOSIS — F329 Major depressive disorder, single episode, unspecified: Secondary | ICD-10-CM | POA: Diagnosis not present

## 2016-06-27 DIAGNOSIS — R600 Localized edema: Secondary | ICD-10-CM | POA: Diagnosis not present

## 2016-06-27 DIAGNOSIS — F419 Anxiety disorder, unspecified: Secondary | ICD-10-CM | POA: Diagnosis not present

## 2016-06-27 DIAGNOSIS — L988 Other specified disorders of the skin and subcutaneous tissue: Secondary | ICD-10-CM | POA: Diagnosis not present

## 2016-06-27 DIAGNOSIS — G809 Cerebral palsy, unspecified: Secondary | ICD-10-CM | POA: Diagnosis not present

## 2016-07-04 DIAGNOSIS — F419 Anxiety disorder, unspecified: Secondary | ICD-10-CM | POA: Diagnosis not present

## 2016-07-04 DIAGNOSIS — R600 Localized edema: Secondary | ICD-10-CM | POA: Diagnosis not present

## 2016-07-04 DIAGNOSIS — G809 Cerebral palsy, unspecified: Secondary | ICD-10-CM | POA: Diagnosis not present

## 2016-07-04 DIAGNOSIS — G40909 Epilepsy, unspecified, not intractable, without status epilepticus: Secondary | ICD-10-CM | POA: Diagnosis not present

## 2016-07-04 DIAGNOSIS — L988 Other specified disorders of the skin and subcutaneous tissue: Secondary | ICD-10-CM | POA: Diagnosis not present

## 2016-07-04 DIAGNOSIS — F329 Major depressive disorder, single episode, unspecified: Secondary | ICD-10-CM | POA: Diagnosis not present

## 2016-07-11 DIAGNOSIS — G40909 Epilepsy, unspecified, not intractable, without status epilepticus: Secondary | ICD-10-CM | POA: Diagnosis not present

## 2016-07-11 DIAGNOSIS — L988 Other specified disorders of the skin and subcutaneous tissue: Secondary | ICD-10-CM | POA: Diagnosis not present

## 2016-07-11 DIAGNOSIS — F419 Anxiety disorder, unspecified: Secondary | ICD-10-CM | POA: Diagnosis not present

## 2016-07-11 DIAGNOSIS — R600 Localized edema: Secondary | ICD-10-CM | POA: Diagnosis not present

## 2016-07-11 DIAGNOSIS — G809 Cerebral palsy, unspecified: Secondary | ICD-10-CM | POA: Diagnosis not present

## 2016-07-11 DIAGNOSIS — F329 Major depressive disorder, single episode, unspecified: Secondary | ICD-10-CM | POA: Diagnosis not present

## 2016-07-18 DIAGNOSIS — R600 Localized edema: Secondary | ICD-10-CM | POA: Diagnosis not present

## 2016-07-18 DIAGNOSIS — G809 Cerebral palsy, unspecified: Secondary | ICD-10-CM | POA: Diagnosis not present

## 2016-07-18 DIAGNOSIS — F419 Anxiety disorder, unspecified: Secondary | ICD-10-CM | POA: Diagnosis not present

## 2016-07-18 DIAGNOSIS — G40909 Epilepsy, unspecified, not intractable, without status epilepticus: Secondary | ICD-10-CM | POA: Diagnosis not present

## 2016-07-18 DIAGNOSIS — L988 Other specified disorders of the skin and subcutaneous tissue: Secondary | ICD-10-CM | POA: Diagnosis not present

## 2016-07-18 DIAGNOSIS — F329 Major depressive disorder, single episode, unspecified: Secondary | ICD-10-CM | POA: Diagnosis not present

## 2016-07-24 DIAGNOSIS — L988 Other specified disorders of the skin and subcutaneous tissue: Secondary | ICD-10-CM | POA: Diagnosis not present

## 2016-07-24 DIAGNOSIS — G809 Cerebral palsy, unspecified: Secondary | ICD-10-CM | POA: Diagnosis not present

## 2016-07-24 DIAGNOSIS — R600 Localized edema: Secondary | ICD-10-CM | POA: Diagnosis not present

## 2016-07-24 DIAGNOSIS — F419 Anxiety disorder, unspecified: Secondary | ICD-10-CM | POA: Diagnosis not present

## 2016-07-24 DIAGNOSIS — G40909 Epilepsy, unspecified, not intractable, without status epilepticus: Secondary | ICD-10-CM | POA: Diagnosis not present

## 2016-07-24 DIAGNOSIS — F329 Major depressive disorder, single episode, unspecified: Secondary | ICD-10-CM | POA: Diagnosis not present

## 2016-07-25 DIAGNOSIS — F329 Major depressive disorder, single episode, unspecified: Secondary | ICD-10-CM | POA: Diagnosis not present

## 2016-07-25 DIAGNOSIS — R600 Localized edema: Secondary | ICD-10-CM | POA: Diagnosis not present

## 2016-07-25 DIAGNOSIS — G809 Cerebral palsy, unspecified: Secondary | ICD-10-CM | POA: Diagnosis not present

## 2016-07-25 DIAGNOSIS — L988 Other specified disorders of the skin and subcutaneous tissue: Secondary | ICD-10-CM | POA: Diagnosis not present

## 2016-07-25 DIAGNOSIS — F419 Anxiety disorder, unspecified: Secondary | ICD-10-CM | POA: Diagnosis not present

## 2016-07-25 DIAGNOSIS — G40909 Epilepsy, unspecified, not intractable, without status epilepticus: Secondary | ICD-10-CM | POA: Diagnosis not present

## 2016-08-01 DIAGNOSIS — R600 Localized edema: Secondary | ICD-10-CM | POA: Diagnosis not present

## 2016-08-01 DIAGNOSIS — G809 Cerebral palsy, unspecified: Secondary | ICD-10-CM | POA: Diagnosis not present

## 2016-08-01 DIAGNOSIS — F419 Anxiety disorder, unspecified: Secondary | ICD-10-CM | POA: Diagnosis not present

## 2016-08-01 DIAGNOSIS — G40909 Epilepsy, unspecified, not intractable, without status epilepticus: Secondary | ICD-10-CM | POA: Diagnosis not present

## 2016-08-01 DIAGNOSIS — F329 Major depressive disorder, single episode, unspecified: Secondary | ICD-10-CM | POA: Diagnosis not present

## 2016-08-01 DIAGNOSIS — L988 Other specified disorders of the skin and subcutaneous tissue: Secondary | ICD-10-CM | POA: Diagnosis not present

## 2016-08-08 DIAGNOSIS — R600 Localized edema: Secondary | ICD-10-CM | POA: Diagnosis not present

## 2016-08-08 DIAGNOSIS — L988 Other specified disorders of the skin and subcutaneous tissue: Secondary | ICD-10-CM | POA: Diagnosis not present

## 2016-08-08 DIAGNOSIS — F329 Major depressive disorder, single episode, unspecified: Secondary | ICD-10-CM | POA: Diagnosis not present

## 2016-08-08 DIAGNOSIS — F419 Anxiety disorder, unspecified: Secondary | ICD-10-CM | POA: Diagnosis not present

## 2016-08-08 DIAGNOSIS — G40909 Epilepsy, unspecified, not intractable, without status epilepticus: Secondary | ICD-10-CM | POA: Diagnosis not present

## 2016-08-08 DIAGNOSIS — G809 Cerebral palsy, unspecified: Secondary | ICD-10-CM | POA: Diagnosis not present

## 2016-08-15 DIAGNOSIS — R600 Localized edema: Secondary | ICD-10-CM | POA: Diagnosis not present

## 2016-08-15 DIAGNOSIS — G40909 Epilepsy, unspecified, not intractable, without status epilepticus: Secondary | ICD-10-CM | POA: Diagnosis not present

## 2016-08-15 DIAGNOSIS — G809 Cerebral palsy, unspecified: Secondary | ICD-10-CM | POA: Diagnosis not present

## 2016-08-15 DIAGNOSIS — F419 Anxiety disorder, unspecified: Secondary | ICD-10-CM | POA: Diagnosis not present

## 2016-08-15 DIAGNOSIS — F329 Major depressive disorder, single episode, unspecified: Secondary | ICD-10-CM | POA: Diagnosis not present

## 2016-08-15 DIAGNOSIS — L988 Other specified disorders of the skin and subcutaneous tissue: Secondary | ICD-10-CM | POA: Diagnosis not present

## 2016-08-23 DIAGNOSIS — G809 Cerebral palsy, unspecified: Secondary | ICD-10-CM | POA: Diagnosis not present

## 2016-08-23 DIAGNOSIS — F419 Anxiety disorder, unspecified: Secondary | ICD-10-CM | POA: Diagnosis not present

## 2016-08-23 DIAGNOSIS — F329 Major depressive disorder, single episode, unspecified: Secondary | ICD-10-CM | POA: Diagnosis not present

## 2016-08-23 DIAGNOSIS — R600 Localized edema: Secondary | ICD-10-CM | POA: Diagnosis not present

## 2016-08-23 DIAGNOSIS — G40909 Epilepsy, unspecified, not intractable, without status epilepticus: Secondary | ICD-10-CM | POA: Diagnosis not present

## 2016-08-23 DIAGNOSIS — L988 Other specified disorders of the skin and subcutaneous tissue: Secondary | ICD-10-CM | POA: Diagnosis not present

## 2016-08-30 ENCOUNTER — Other Ambulatory Visit (HOSPITAL_COMMUNITY): Payer: Self-pay

## 2016-08-30 DIAGNOSIS — G40909 Epilepsy, unspecified, not intractable, without status epilepticus: Secondary | ICD-10-CM | POA: Diagnosis not present

## 2016-08-30 DIAGNOSIS — F329 Major depressive disorder, single episode, unspecified: Secondary | ICD-10-CM | POA: Diagnosis not present

## 2016-08-30 DIAGNOSIS — L988 Other specified disorders of the skin and subcutaneous tissue: Secondary | ICD-10-CM | POA: Diagnosis not present

## 2016-08-30 DIAGNOSIS — F419 Anxiety disorder, unspecified: Secondary | ICD-10-CM | POA: Diagnosis not present

## 2016-08-30 DIAGNOSIS — D509 Iron deficiency anemia, unspecified: Secondary | ICD-10-CM

## 2016-08-30 DIAGNOSIS — R600 Localized edema: Secondary | ICD-10-CM | POA: Diagnosis not present

## 2016-08-30 DIAGNOSIS — G809 Cerebral palsy, unspecified: Secondary | ICD-10-CM | POA: Diagnosis not present

## 2016-08-30 MED ORDER — IRON POLYSACCH CMPLX-B12-FA 150-0.025-1 MG PO CAPS: 1.0000 | ORAL_CAPSULE | Freq: Every day | ORAL | 5 refills | Status: DC

## 2016-08-30 NOTE — Telephone Encounter (Signed)
Received refill request from pharmacy for poly-iron. Refilled per Kirby Crigler, PA-C. Prescription e-scribed to pharmacy.

## 2016-09-04 DIAGNOSIS — G40822 Epileptic spasms, not intractable, without status epilepticus: Secondary | ICD-10-CM | POA: Diagnosis not present

## 2016-09-04 DIAGNOSIS — D509 Iron deficiency anemia, unspecified: Secondary | ICD-10-CM | POA: Diagnosis not present

## 2016-09-05 DIAGNOSIS — G40909 Epilepsy, unspecified, not intractable, without status epilepticus: Secondary | ICD-10-CM | POA: Diagnosis not present

## 2016-09-05 DIAGNOSIS — G809 Cerebral palsy, unspecified: Secondary | ICD-10-CM | POA: Diagnosis not present

## 2016-09-05 DIAGNOSIS — F329 Major depressive disorder, single episode, unspecified: Secondary | ICD-10-CM | POA: Diagnosis not present

## 2016-09-05 DIAGNOSIS — R600 Localized edema: Secondary | ICD-10-CM | POA: Diagnosis not present

## 2016-09-05 DIAGNOSIS — L988 Other specified disorders of the skin and subcutaneous tissue: Secondary | ICD-10-CM | POA: Diagnosis not present

## 2016-09-05 DIAGNOSIS — F419 Anxiety disorder, unspecified: Secondary | ICD-10-CM | POA: Diagnosis not present

## 2016-09-12 ENCOUNTER — Inpatient Hospital Stay (HOSPITAL_COMMUNITY)
Admission: EM | Admit: 2016-09-12 | Discharge: 2016-09-17 | DRG: 871 | Disposition: A | Discharge status: Home Health | Payer: Medicare Other | Attending: Internal Medicine | Admitting: Internal Medicine

## 2016-09-12 ENCOUNTER — Emergency Department (HOSPITAL_COMMUNITY): Payer: Medicare Other

## 2016-09-12 ENCOUNTER — Encounter (HOSPITAL_COMMUNITY): Payer: Self-pay | Admitting: Emergency Medicine

## 2016-09-12 DIAGNOSIS — E872 Acidosis: Secondary | ICD-10-CM | POA: Diagnosis present

## 2016-09-12 DIAGNOSIS — N390 Urinary tract infection, site not specified: Secondary | ICD-10-CM

## 2016-09-12 DIAGNOSIS — D509 Iron deficiency anemia, unspecified: Secondary | ICD-10-CM | POA: Diagnosis present

## 2016-09-12 DIAGNOSIS — Z8719 Personal history of other diseases of the digestive system: Secondary | ICD-10-CM | POA: Diagnosis not present

## 2016-09-12 DIAGNOSIS — Z23 Encounter for immunization: Secondary | ICD-10-CM | POA: Diagnosis not present

## 2016-09-12 DIAGNOSIS — E876 Hypokalemia: Secondary | ICD-10-CM | POA: Diagnosis present

## 2016-09-12 DIAGNOSIS — R4182 Altered mental status, unspecified: Secondary | ICD-10-CM | POA: Diagnosis not present

## 2016-09-12 DIAGNOSIS — B9620 Unspecified Escherichia coli [E. coli] as the cause of diseases classified elsewhere: Secondary | ICD-10-CM | POA: Diagnosis present

## 2016-09-12 DIAGNOSIS — G800 Spastic quadriplegic cerebral palsy: Secondary | ICD-10-CM | POA: Diagnosis not present

## 2016-09-12 DIAGNOSIS — G809 Cerebral palsy, unspecified: Secondary | ICD-10-CM | POA: Diagnosis present

## 2016-09-12 DIAGNOSIS — F329 Major depressive disorder, single episode, unspecified: Secondary | ICD-10-CM | POA: Diagnosis not present

## 2016-09-12 DIAGNOSIS — L8990 Pressure ulcer of unspecified site, unspecified stage: Secondary | ICD-10-CM | POA: Insufficient documentation

## 2016-09-12 DIAGNOSIS — K2970 Gastritis, unspecified, without bleeding: Secondary | ICD-10-CM | POA: Diagnosis not present

## 2016-09-12 DIAGNOSIS — R600 Localized edema: Secondary | ICD-10-CM | POA: Diagnosis not present

## 2016-09-12 DIAGNOSIS — N12 Tubulo-interstitial nephritis, not specified as acute or chronic: Secondary | ICD-10-CM

## 2016-09-12 DIAGNOSIS — G9341 Metabolic encephalopathy: Secondary | ICD-10-CM | POA: Diagnosis present

## 2016-09-12 DIAGNOSIS — F32A Depression, unspecified: Secondary | ICD-10-CM | POA: Diagnosis present

## 2016-09-12 DIAGNOSIS — Z87891 Personal history of nicotine dependence: Secondary | ICD-10-CM | POA: Diagnosis not present

## 2016-09-12 DIAGNOSIS — G40909 Epilepsy, unspecified, not intractable, without status epilepticus: Secondary | ICD-10-CM | POA: Diagnosis present

## 2016-09-12 DIAGNOSIS — L988 Other specified disorders of the skin and subcutaneous tissue: Secondary | ICD-10-CM | POA: Diagnosis not present

## 2016-09-12 DIAGNOSIS — F419 Anxiety disorder, unspecified: Secondary | ICD-10-CM | POA: Diagnosis present

## 2016-09-12 DIAGNOSIS — R10819 Abdominal tenderness, unspecified site: Secondary | ICD-10-CM | POA: Diagnosis not present

## 2016-09-12 DIAGNOSIS — D649 Anemia, unspecified: Secondary | ICD-10-CM | POA: Diagnosis not present

## 2016-09-12 DIAGNOSIS — A419 Sepsis, unspecified organism: Principal | ICD-10-CM | POA: Diagnosis present

## 2016-09-12 DIAGNOSIS — R279 Unspecified lack of coordination: Secondary | ICD-10-CM | POA: Diagnosis not present

## 2016-09-12 DIAGNOSIS — G808 Other cerebral palsy: Secondary | ICD-10-CM | POA: Diagnosis not present

## 2016-09-12 DIAGNOSIS — Z7401 Bed confinement status: Secondary | ICD-10-CM | POA: Diagnosis not present

## 2016-09-12 LAB — COMPREHENSIVE METABOLIC PANEL
ALBUMIN: 2.9 g/dL — AB (ref 3.5–5.0)
ALT: 13 U/L — ABNORMAL LOW (ref 17–63)
ANION GAP: 12 (ref 5–15)
AST: 21 U/L (ref 15–41)
Alkaline Phosphatase: 59 U/L (ref 38–126)
BILIRUBIN TOTAL: 0.6 mg/dL (ref 0.3–1.2)
BUN: 10 mg/dL (ref 6–20)
CHLORIDE: 98 mmol/L — AB (ref 101–111)
CO2: 26 mmol/L (ref 22–32)
Calcium: 8.8 mg/dL — ABNORMAL LOW (ref 8.9–10.3)
Creatinine, Ser: 0.51 mg/dL — ABNORMAL LOW (ref 0.61–1.24)
GFR calc Af Amer: >60 mL/min (ref >60)
Glucose, Bld: 106 mg/dL — ABNORMAL HIGH (ref 65–99)
POTASSIUM: 3.3 mmol/L — AB (ref 3.5–5.1)
Sodium: 136 mmol/L (ref 135–145)
TOTAL PROTEIN: 9.2 g/dL — AB (ref 6.5–8.1)

## 2016-09-12 LAB — URINALYSIS, ROUTINE W REFLEX MICROSCOPIC
Bilirubin Urine: NEGATIVE
GLUCOSE, UA: NEGATIVE mg/dL
Hgb urine dipstick: NEGATIVE
Ketones, ur: 15 mg/dL — AB
Nitrite: POSITIVE — AB
PH: 6.5 (ref 5.0–8.0)
PROTEIN: 30 mg/dL — AB
SPECIFIC GRAVITY, URINE: 1.01 (ref 1.005–1.030)

## 2016-09-12 LAB — CBC WITH DIFFERENTIAL/PLATELET
BASOS ABS: 0 10*3/uL (ref 0.0–0.1)
Basophils Relative: 0 %
EOS PCT: 2 %
Eosinophils Absolute: 0.3 10*3/uL (ref 0.0–0.7)
HCT: 32.4 % — ABNORMAL LOW (ref 39.0–52.0)
HEMOGLOBIN: 8.3 g/dL — AB (ref 13.0–17.0)
LYMPHS PCT: 12 %
Lymphs Abs: 1.5 10*3/uL (ref 0.7–4.0)
MCH: 17.7 pg — ABNORMAL LOW (ref 26.0–34.0)
MCHC: 25.6 g/dL — ABNORMAL LOW (ref 30.0–36.0)
MCV: 68.9 fL — ABNORMAL LOW (ref 78.0–100.0)
MONOS PCT: 5 %
Monocytes Absolute: 0.6 10*3/uL (ref 0.1–1.0)
Neutro Abs: 10.3 10*3/uL — ABNORMAL HIGH (ref 1.7–7.7)
Neutrophils Relative %: 81 %
PLATELETS: 633 10*3/uL — AB (ref 150–400)
RBC: 4.7 MIL/uL (ref 4.22–5.81)
RDW: 24.3 % — ABNORMAL HIGH (ref 11.5–15.5)
WBC MORPHOLOGY: INCREASED
WBC: 12.7 10*3/uL — AB (ref 4.0–10.5)

## 2016-09-12 LAB — URINE MICROSCOPIC-ADD ON

## 2016-09-12 LAB — I-STAT CG4 LACTIC ACID, ED
LACTIC ACID, VENOUS: 2.64 mmol/L — AB (ref 0.5–1.9)
Lactic Acid, Venous: 1.47 mmol/L (ref 0.5–1.9)

## 2016-09-12 MED ORDER — VANCOMYCIN HCL 10 G IV SOLR
1500.0000 mg | Freq: Once | INTRAVENOUS | Status: AC
  Administered 2016-09-12: 1500 mg via INTRAVENOUS
  Filled 2016-09-12: qty 1500

## 2016-09-12 MED ORDER — POTASSIUM CHLORIDE CRYS ER 20 MEQ PO TBCR
40.0000 meq | EXTENDED_RELEASE_TABLET | Freq: Every day | ORAL | Status: DC
  Administered 2016-09-12 – 2016-09-17 (×6): 40 meq via ORAL
  Filled 2016-09-12 (×6): qty 2

## 2016-09-12 MED ORDER — PIPERACILLIN-TAZOBACTAM 3.375 G IVPB 30 MIN
3.3750 g | Freq: Once | INTRAVENOUS | Status: AC
  Administered 2016-09-12: 3.375 g via INTRAVENOUS
  Filled 2016-09-12: qty 50

## 2016-09-12 MED ORDER — SODIUM CHLORIDE 0.9 % IV BOLUS (SEPSIS)
1000.0000 mL | Freq: Once | INTRAVENOUS | Status: AC
  Administered 2016-09-12: 1000 mL via INTRAVENOUS

## 2016-09-12 MED ORDER — VANCOMYCIN HCL IN DEXTROSE 1-5 GM/200ML-% IV SOLN: 1000.0000 mg | Freq: Once | INTRAVENOUS | Status: DC

## 2016-09-12 MED ORDER — SODIUM CHLORIDE 0.9 % IV BOLUS (SEPSIS)
250.0000 mL | Freq: Once | INTRAVENOUS | Status: AC
  Administered 2016-09-12: 250 mL via INTRAVENOUS

## 2016-09-12 NOTE — H&P (Signed)
History and Physical    ANASTACIO RACCA [id number] DOB: [birth year] DOA: 09/12/2016  PCP: Rosita Fire, MD  Patient coming from: [facility]   Chief Complaint: Altered mental status   HPI: Ian Hurley is a 50 y.o. male with a past medical history significant for cerebral palsy and a seizure disorder presented from [facility] with altered mental status. Patient was unable to provide any information. It was felt that he may have been septic, as his lactic was elevated. However, his BP was normal. Orginally, it was not sure of the source, so he was started on broad spectrum antibiotics including Lucianne Lei and Zosyn.  His work up however did show a positive UA.  ED Course: Potassium 3.3, WBC 12.7, Hgb 8.3, Platelets 633, 2.6, Lactic acid UA indicative of infection.   Review of Systems: As per HPI otherwise 10 point review of systems negative.    Past Medical History:  Diagnosis Date  . Anemia   . Anxiety   . 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 Cerebral palsy ([place])   . Depression   . Duodenitis MAY 2015   HB 6.6 MCV 63 FERRITIN 6  . Erosive esophagitis MAY 2015   HB 6.6 MCV 63 FERRITIN 6  . Polycythemia 05/17/2015  . Seizures ([place])     Past Surgical History:  Procedure Laterality Date  . COLONOSCOPY N/A 05/24/2014   [id number] rectal polyp otherwise normal  . ESOPHAGOGASTRODUODENOSCOPY N/A 05/23/2014   [id number] HH/mild non-erosive gastritis/small gastric polyp  . FLEXIBLE SIGMOIDOSCOPY N/A 05/23/2014   SLF: formed stool in colon and large amount of liquid stool     reports that he quit smoking about 2 years ago. His smoking use included Cigarettes. He has never used smokeless tobacco. He reports that he does not drink alcohol or use drugs.  No Known Allergies  No family history on file.   Prior to Admission medications   Medication Sig Start Date End Date Taking? Authorizing Provider  cyclobenzaprine (FLEXERIL) 10 MG tablet Take 10  mg by mouth 3 (three) times daily.   Yes Historical Provider, MD  docusate sodium (COLACE) 100 MG capsule Take 100 mg by mouth every morning.   Yes Historical Provider, MD  furosemide (LASIX) 40 MG tablet Take 40 mg by mouth daily. *May take one tablet (40mg  total) daily as needed for edema* In addition to daily 40mg  regimen   Yes Historical Provider, MD  Iron Polysacch Cmplx-B12-FA (POLY-IRON 150 FORTE) 150-0.025-1 MG CAPS Take 1 capsule by mouth daily. 08/30/16  Yes Baird Cancer, PA-C  liver oil-zinc oxide (BOUDREAUXS BUTT PASTE) 40 % ointment Apply 1 application topically 2 (two) times daily as needed for irritation.    Yes Historical Provider, MD  LORazepam (ATIVAN) 1 MG tablet Take 1 mg by mouth every 8 (eight) hours as needed for anxiety.   Yes Historical Provider, MD  OXYGEN Inhale 2 L into the lungs at bedtime.   Yes Historical Provider, MD  pantoprazole (PROTONIX) 40 MG tablet Take 1 tablet (40 mg total) by mouth 2 (two) times daily before a meal. 05/26/14  Yes Rosita Fire, MD  PHENobarbital (LUMINAL) 64.8 MG tablet Take 32.4-64.8 mg by mouth 2 (two) times daily. Take one-half tablet by mouth in the morning and take one tablet at bedtime   Yes Historical Provider, MD  potassium chloride SA (K-DUR,KLOR-CON) 20 MEQ tablet Take 40 mEq by mouth daily.    Yes Historical Provider, MD  QUEtiapine (SEROQUEL) 50 MG tablet Take 50 mg by mouth 2 (two) times daily.   Yes Historical Provider, MD  sertraline (ZOLOFT) 100 MG tablet Take 100 mg by mouth daily.   Yes Historical Provider, MD  traMADol (ULTRAM) 50 MG tablet Take 50 mg by mouth 2 (two) times daily as needed for moderate pain.    Yes Historical Provider, MD    Physical Exam: Vitals:   09/12/16 1639 09/12/16 1752 09/12/16 1800 09/12/16 1830  BP:  135/78 127/69 110/90  Pulse:  (!) 125 (!) 126 (!) 123  Resp:  16 (!) 29 26  Temp: 98.4 F (36.9 C)     TempSrc: Rectal     SpO2:  98% 95% 100%  Weight:          Constitutional: NAD,  calm, comfortable Vitals:   09/12/16 1639 09/12/16 1752 09/12/16 1800 09/12/16 1830  BP:  135/78 127/69 110/90  Pulse:  (!) 125 (!) 126 (!) 123  Resp:  16 (!) 29 26  Temp: 98.4 F (36.9 C)     TempSrc: Rectal     SpO2:  98% 95% 100%  Weight:       Eyes: PERRL, lids and conjunctivae normal ENMT: Mucous membranes are moist. Posterior pharynx clear of any exudate or lesions.Normal dentition.  Neck: normal, supple, no masses, no thyromegaly Respiratory: clear to auscultation bilaterally, no wheezing, no crackles. Normal respiratory effort. No accessory muscle use.  Cardiovascular: Regular rate and rhythm, no murmurs / rubs / gallops. No extremity edema. 2+ pedal pulses. No carotid bruits.  Abdomen: no tenderness, no masses palpated. No hepatosplenomegaly. Bowel sounds positive.  Musculoskeletal: no clubbing / cyanosis. No joint deformity upper and lower extremities. Good ROM, no contractures. Normal muscle tone.  Skin: no rashes, lesions, ulcers. No induration Neurologic: CN 2-12 grossly intact. Sensation intact, DTR normal. Strength 5/5 in all 4.  Psychiatric: Normal judgment and insight. Alert and oriented x 3. Normal mood.     Labs on Admission: I have personally reviewed following labs and imaging studies  CBC:  Recent Labs Lab 09/12/16 1610  WBC 12.7*  NEUTROABS 10.3*  HGB 8.3*  HCT 32.4*  MCV 68.9*  PLT 99991111*   Basic Metabolic Panel:  Recent Labs Lab 09/12/16 1610  NA 136  K 3.3*  CL 98*  CO2 26  GLUCOSE 106*  BUN 10  CREATININE 0.51*  CALCIUM 8.8*   Liver Function Tests:  Recent Labs Lab 09/12/16 1610  AST 21  ALT 13*  ALKPHOS 59  BILITOT 0.6  PROT 9.2*  ALBUMIN 2.9*   Urine analysis:    Component Value Date/Time   COLORURINE YELLOW 09/12/2016 1605   APPEARANCEUR TURBID (A) 09/12/2016 1605   LABSPEC 1.010 09/12/2016 1605   PHURINE 6.5 09/12/2016 1605   GLUCOSEU NEGATIVE 09/12/2016 1605   HGBUR NEGATIVE 09/12/2016 1605   BILIRUBINUR NEGATIVE  09/12/2016 1605   KETONESUR 15 (A) 09/12/2016 1605   PROTEINUR 30 (A) 09/12/2016 1605   UROBILINOGEN 0.2 03/18/2015 1906   NITRITE POSITIVE (A) 09/12/2016 1605   LEUKOCYTESUR LARGE (A) 09/12/2016 1605    Recent Results (from the past 240 hour(s))  Blood Culture (routine x 2)     Status: None (Preliminary result)   Collection Time: 09/12/16  4:10 PM  Result Value Ref Range Status   Specimen Description BLOOD LEFT HAND  Final   Special Requests BOTTLES DRAWN AEROBIC ONLY 4CC  Final   Culture PENDING  Incomplete   Report Status PENDING  Incomplete  Blood Culture (routine x 2)     Status: None (Preliminary result)   Collection Time: 09/12/16  4:27 PM  Result Value Ref Range Status   Specimen Description BLOOD LEFT HAND  Final   Special Requests BOTTLES DRAWN AEROBIC ONLY 4CC  Final   Culture PENDING  Incomplete   Report Status PENDING  Incomplete     Radiological Exams on Admission: Dg Chest Port 1 View  Result Date: 09/12/2016 CLINICAL DATA:  Sepsis. EXAM: PORTABLE CHEST 1 VIEW COMPARISON:  Radiographs of March 18, 2015. FINDINGS: The heart size and mediastinal contours are within normal limits. No pneumothorax or pleural effusion is noted. Lungs are clear. The visualized skeletal structures are unremarkable. IMPRESSION: No acute cardiopulmonary abnormality seen. Electronically Signed   By: Marijo Conception, M.D.   On: 09/12/2016 17:51    EKG: Independently reviewed. EKG showed sinus tachycardia.   Assessment/Plan  1. Sepsis with UTI.  UA indicative of infection. Follow-up cultures. Lactic acid is elevated at 2.6. WBC count 12,700. Start on rocephin and IVFs.  BP is OK at this time.  Will admit to telemetry.  2. Acute encephalopathy. Patient was admitted from [facility]. He was unable to provide any information.  Possibly due to UTI.  3. Lactic acidosis. Lactic acid 2.6.   4. Microcytic anemia. Hgb 8.3.  5. Hypokalemia:  Will replete.  6. Seizure disorder. No recent  seizure reported.  Continue with phenobarbital.  D/C Ultram as it can lower seizure threadhold.  7. Anxiety and depression. Noted.    DVT prophylaxis:  SCDs  Code Status: Full  Family Communication: No family bedside Disposition Plan: Discharge back to [facility] once improved.  Consults called: None  Admission status: Inpatient    Orvan Falconer, MD FACP [facility] If 7PM-7AM, please contact night-coverage [email or web address] Password [email or web address]  09/12/2016, 7:16 PM    By signing my name below, I, Collene Leyden, attest that this documentation has been prepared under the direction and in the presence of Orvan Falconer, MD. Electronically signed: Collene Leyden, Scribe. 09/12/16 7:25 PM

## 2016-09-12 NOTE — ED Notes (Signed)
X-ray at bedside

## 2016-09-12 NOTE — ED Triage Notes (Signed)
Pt from [facility]. Per EMS, RN noticed became more lethargic and urine was dark yesterday. Pt also c/o RT sided abd pain w/o n/v/d. Pt had a low grade temp of 99.3 and HR is 120-125 per EMS. Pt alert and oriented at this time.

## 2016-09-12 NOTE — ED Provider Notes (Signed)
[facility] DEPT Provider Note   CSN: [id number] Arrival date & time: 09/12/16  1506     History   Chief Complaint Chief Complaint  Patient presents with  . Altered Mental Status    HPI Ian Hurley is a 50 y.o. male.  The history is provided by the patient, the EMS personnel and the nursing home. The history is limited by a developmental delay.   He presented from skilled nursing facility for reported altered mental status, changes from his baseline. Nursing facility reported darker urine.  Limited history from the patient but was able to endorse left flank pain and abdominal pain.   Remainder of history, ROS, and physical exam limited due to patient's condition (AMS).   Level V Caveat.    Past Medical History:  Diagnosis Date  . Anemia   . Anxiety   . 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 Cerebral palsy ([place])   . Depression   . Duodenitis MAY 2015   HB 6.6 MCV 63 FERRITIN 6  . Erosive esophagitis MAY 2015   HB 6.6 MCV 63 FERRITIN 6  . Polycythemia 05/17/2015  . Seizures [facility])     Patient Active Problem List   Diagnosis Date Noted  . Sepsis ([place]) 09/12/2016  . Constipation 07/19/2015  . Polycythemia 05/17/2015  . Anemia 05/21/2014  . Seizure disorder ([place]) 05/21/2014  . Anxiety 05/21/2014  . Cerebral palsy ([place]) 05/21/2014  . Depression 05/21/2014    Past Surgical History:  Procedure Laterality Date  . COLONOSCOPY N/A 05/24/2014   [id number] rectal polyp otherwise normal  . ESOPHAGOGASTRODUODENOSCOPY N/A 05/23/2014   [id number] HH/mild non-erosive gastritis/small gastric polyp  . FLEXIBLE SIGMOIDOSCOPY N/A 05/23/2014   SLF: formed stool in colon and large amount of liquid stool       Home Medications    Prior to Admission medications   Medication Sig Start Date End Date Taking? Authorizing Provider  cyclobenzaprine (FLEXERIL) 10 MG tablet Take 10 mg by mouth 3 (three) times daily.   Yes Historical Provider, MD    docusate sodium (COLACE) 100 MG capsule Take 100 mg by mouth every morning.   Yes Historical Provider, MD  furosemide (LASIX) 40 MG tablet Take 40 mg by mouth daily. *May take one tablet (40mg  total) daily as needed for edema* In addition to daily 40mg  regimen   Yes Historical Provider, MD  Iron Polysacch Cmplx-B12-FA (POLY-IRON 150 FORTE) 150-0.025-1 MG CAPS Take 1 capsule by mouth daily. 08/30/16  Yes Baird Cancer, PA-C  liver oil-zinc oxide (BOUDREAUXS BUTT PASTE) 40 % ointment Apply 1 application topically 2 (two) times daily as needed for irritation.    Yes Historical Provider, MD  LORazepam (ATIVAN) 1 MG tablet Take 1 mg by mouth every 8 (eight) hours as needed for anxiety.   Yes Historical Provider, MD  OXYGEN Inhale 2 L into the lungs at bedtime.   Yes Historical Provider, MD  pantoprazole (PROTONIX) 40 MG tablet Take 1 tablet (40 mg total) by mouth 2 (two) times daily before a meal. 05/26/14  Yes Rosita Fire, MD  PHENobarbital (LUMINAL) 64.8 MG tablet Take 32.4-64.8 mg by mouth 2 (two) times daily. Take one-half tablet by mouth in the morning and take one tablet at bedtime   Yes Historical Provider, MD  potassium chloride SA (K-DUR,KLOR-CON) 20 MEQ tablet Take 40 mEq by mouth daily.    Yes Historical Provider, MD  QUEtiapine (SEROQUEL) 50 MG tablet Take 50 mg by mouth  2 (two) times daily.   Yes Historical Provider, MD  sertraline (ZOLOFT) 100 MG tablet Take 100 mg by mouth daily.   Yes Historical Provider, MD  traMADol (ULTRAM) 50 MG tablet Take 50 mg by mouth 2 (two) times daily as needed for moderate pain.    Yes Historical Provider, MD    Family History History reviewed. No pertinent family history.  Social History Social History  Substance Use Topics  . Smoking status: Former Smoker    Types: Cigarettes    Quit date: 01/16/2014  . Smokeless tobacco: Never Used     Comment: Former smoker  . Alcohol use No     Allergies   Review of patient's allergies indicates no known  allergies.   Review of Systems Review of Systems  Unable to perform ROS: Mental status change     Physical Exam Updated Vital Signs BP 121/80 (BP Location: Left Arm)   Pulse (!) 123   Temp 98.3 F (36.8 C) (Oral)   Resp 18   Wt 160 lb (72.6 kg)   SpO2 100%   BMI 31.25 kg/m   Physical Exam  Constitutional: He appears well-developed and well-nourished. No distress.  HENT:  Head: Normocephalic and atraumatic.  Nose: Nose normal.  Eyes: Conjunctivae and EOM are normal. Pupils are equal, round, and reactive to light. Right eye exhibits no discharge. Left eye exhibits no discharge. No scleral icterus.  Neck: Normal range of motion. Neck supple.  Cardiovascular: Regular rhythm.  Tachycardia present.  Exam reveals no gallop and no friction rub.   No murmur heard. Pulmonary/Chest: Effort normal and breath sounds normal. No stridor. No respiratory distress. He has no rales.  Abdominal: Soft. He exhibits no distension. There is tenderness in the suprapubic area. There is no rigidity, no rebound and no guarding.  Musculoskeletal: He exhibits no edema or tenderness.  Neurological: He is alert. He is disoriented.  Baseline contractions from cerebral palsy  Skin: Skin is warm and dry. No rash noted. He is not diaphoretic. No erythema.  Psychiatric: He has a normal mood and affect.  Vitals reviewed.    ED Treatments / Results  Labs (all labs ordered are listed, but only abnormal results are displayed) Labs Reviewed  COMPREHENSIVE METABOLIC PANEL - Abnormal; Notable for the following:       Result Value   Potassium 3.3 (*)    Chloride 98 (*)    Glucose, Bld 106 (*)    Creatinine, Ser 0.51 (*)    Calcium 8.8 (*)    Total Protein 9.2 (*)    Albumin 2.9 (*)    ALT 13 (*)    All other components within normal limits  CBC WITH DIFFERENTIAL/PLATELET - Abnormal; Notable for the following:    WBC 12.7 (*)    Hemoglobin 8.3 (*)    HCT 32.4 (*)    MCV 68.9 (*)    MCH 17.7 (*)     MCHC 25.6 (*)    RDW 24.3 (*)    Platelets 633 (*)    Neutro Abs 10.3 (*)    All other components within normal limits  URINALYSIS, ROUTINE W REFLEX MICROSCOPIC (NOT AT [facility]) - Abnormal; Notable for the following:    APPearance TURBID (*)    Ketones, ur 15 (*)    Protein, ur 30 (*)    Nitrite POSITIVE (*)    Leukocytes, UA LARGE (*)    All other components within normal limits  URINE MICROSCOPIC-ADD ON - Abnormal; Notable for the  following:    Squamous Epithelial / LPF TOO NUMEROUS TO COUNT (*)    Bacteria, UA MANY (*)    All other components within normal limits  I-STAT CG4 LACTIC ACID, ED - Abnormal; Notable for the following:    Lactic Acid, Venous 2.64 (*)    All other components within normal limits  CULTURE, BLOOD (ROUTINE X 2)  CULTURE, BLOOD (ROUTINE X 2)  URINE CULTURE  MRSA PCR SCREENING  MRSA PCR SCREENING  CBC  COMPREHENSIVE METABOLIC PANEL  I-STAT CG4 LACTIC ACID, ED    EKG  EKG Interpretation  Date/Time:  Wednesday September 12 2016 17:48:55 EDT Ventricular Rate:  126 PR Interval:    QRS Duration: 86 QT Interval:  317 QTC Calculation: 459 R Axis:   58 Text Interpretation:  Sinus tachycardia Borderline T abnormalities, inferior leads Confirmed by [facility] MD, [place]o Clasby ([id number]) on 09/12/2016 6:30:39 PM       Radiology Dg Chest Port 1 View  Result Date: 09/12/2016 CLINICAL DATA:  Sepsis. EXAM: PORTABLE CHEST 1 VIEW COMPARISON:  Radiographs of March 18, 2015. FINDINGS: The heart size and mediastinal contours are within normal limits. No pneumothorax or pleural effusion is noted. Lungs are clear. The visualized skeletal structures are unremarkable. IMPRESSION: No acute cardiopulmonary abnormality seen. Electronically Signed   By: Marijo Conception, M.D.   On: 09/12/2016 17:51    Procedures Procedures (including critical care time)  Medications Ordered in ED Medications  docusate sodium (COLACE) capsule 100 mg (not administered)  pantoprazole (PROTONIX) EC  tablet 40 mg (not administered)  QUEtiapine (SEROQUEL) tablet 50 mg (not administered)  sertraline (ZOLOFT) tablet 100 mg (not administered)  enoxaparin (LOVENOX) injection 40 mg (not administered)  0.9 %  sodium chloride infusion (not administered)  cefTRIAXone (ROCEPHIN) 1 g in dextrose 5 % 50 mL IVPB (not administered)  potassium chloride SA (K-DUR,KLOR-CON) CR tablet 40 mEq (40 mEq Oral Given 09/12/16 2146)  PHENobarbital (LUMINAL) tablet 32.4 mg (not administered)  PHENobarbital (LUMINAL) tablet 64.8 mg (not administered)  Influenza vac split quadrivalent PF (FLUARIX) injection 0.5 mL (not administered)  sodium chloride 0.9 % bolus 1,000 mL (0 mLs Intravenous Stopped 09/12/16 1800)  sodium chloride 0.9 % bolus 1,000 mL (0 mLs Intravenous Stopped 09/12/16 2100)    And  sodium chloride 0.9 % bolus 250 mL (0 mLs Intravenous Stopped 09/13/16 0036)  piperacillin-tazobactam (ZOSYN) IVPB 3.375 g (0 g Intravenous Stopped 09/12/16 1819)  vancomycin (VANCOCIN) 1,500 mg in sodium chloride 0.9 % 500 mL IVPB (0 mg Intravenous Stopped 09/12/16 2059)     Initial Impression / Assessment and Plan / ED Course  I have reviewed the triage vital signs and the nursing notes.  Pertinent labs & imaging results that were available during my care of the patient were reviewed by me and considered in my medical decision making (see chart for details).  Clinical Course    Code sepsis initiated for leukocytosis and elevated lactate at 2.6. 30 mL/kg of IV fluids started. Empiric antibiotics initiated. Currently source is unknown at this time but high suspicion for urinary source.  UA confirmed infection. Likely pyelonephritis given the patient's flank pain. No other sources of infection identified on workup. Discussed the case with hospitalist who will admit for further management.  Final Clinical Impressions(s) / ED Diagnoses   Final diagnoses:  Sepsis, due to unspecified organism [facility])  Pyelonephritis     Disposition: Admit  Condition: Stable    Fatima Blank, MD 09/13/16 [phone]

## 2016-09-13 ENCOUNTER — Encounter (HOSPITAL_COMMUNITY): Payer: Self-pay

## 2016-09-13 DIAGNOSIS — L8990 Pressure ulcer of unspecified site, unspecified stage: Secondary | ICD-10-CM | POA: Insufficient documentation

## 2016-09-13 LAB — COMPREHENSIVE METABOLIC PANEL
ALT: 11 U/L — AB (ref 17–63)
AST: 14 U/L — AB (ref 15–41)
Albumin: 2.3 g/dL — ABNORMAL LOW (ref 3.5–5.0)
Alkaline Phosphatase: 47 U/L (ref 38–126)
Anion gap: 8 (ref 5–15)
BILIRUBIN TOTAL: 0.3 mg/dL (ref 0.3–1.2)
BUN: 7 mg/dL (ref 6–20)
CO2: 23 mmol/L (ref 22–32)
CREATININE: 0.39 mg/dL — AB (ref 0.61–1.24)
Calcium: 7.7 mg/dL — ABNORMAL LOW (ref 8.9–10.3)
Chloride: 110 mmol/L (ref 101–111)
GFR calc Af Amer: >60 mL/min (ref >60)
GLUCOSE: 84 mg/dL (ref 65–99)
Potassium: 3.1 mmol/L — ABNORMAL LOW (ref 3.5–5.1)
Sodium: 141 mmol/L (ref 135–145)
TOTAL PROTEIN: 7.4 g/dL (ref 6.5–8.1)

## 2016-09-13 LAB — CBC
HEMATOCRIT: 29.4 % — AB (ref 39.0–52.0)
HEMOGLOBIN: 7.4 g/dL — AB (ref 13.0–17.0)
MCH: 17.7 pg — ABNORMAL LOW (ref 26.0–34.0)
MCHC: 25.2 g/dL — ABNORMAL LOW (ref 30.0–36.0)
MCV: 70.2 fL — AB (ref 78.0–100.0)
Platelets: 621 10*3/uL — ABNORMAL HIGH (ref 150–400)
RBC: 4.19 MIL/uL — AB (ref 4.22–5.81)
RDW: 24.6 % — ABNORMAL HIGH (ref 11.5–15.5)
WBC: 7.8 10*3/uL (ref 4.0–10.5)

## 2016-09-13 LAB — MRSA PCR SCREENING: MRSA BY PCR: NEGATIVE

## 2016-09-13 LAB — PREPARE RBC (CROSSMATCH)

## 2016-09-13 MED ORDER — DEXTROSE 5 % IV SOLN
1.0000 g | INTRAVENOUS | Status: DC
  Administered 2016-09-13 – 2016-09-16 (×5): 1 g via INTRAVENOUS
  Filled 2016-09-13 (×6): qty 10

## 2016-09-13 MED ORDER — DEXTROSE 5 % IV SOLN
INTRAVENOUS | Status: AC
  Filled 2016-09-13: qty 10

## 2016-09-13 MED ORDER — INFLUENZA VAC SPLIT QUAD 0.5 ML IM SUSY
0.5000 mL | PREFILLED_SYRINGE | INTRAMUSCULAR | Status: AC
  Administered 2016-09-14: 0.5 mL via INTRAMUSCULAR
  Filled 2016-09-13: qty 0.5

## 2016-09-13 MED ORDER — PHENOBARBITAL 32.4 MG PO TABS
64.8000 mg | ORAL_TABLET | Freq: Every day | ORAL | Status: DC
  Administered 2016-09-13 – 2016-09-16 (×5): 64.8 mg via ORAL
  Filled 2016-09-13 (×5): qty 2

## 2016-09-13 MED ORDER — PHENOBARBITAL 32.4 MG PO TABS: 32.4000 mg | ORAL_TABLET | Freq: Two times a day (BID) | ORAL | Status: DC

## 2016-09-13 MED ORDER — ENOXAPARIN SODIUM 40 MG/0.4ML ~~LOC~~ SOLN
40.0000 mg | SUBCUTANEOUS | Status: DC
  Administered 2016-09-13 – 2016-09-16 (×5): 40 mg via SUBCUTANEOUS
  Filled 2016-09-13 (×5): qty 0.4

## 2016-09-13 MED ORDER — SODIUM CHLORIDE 0.9 % IV SOLN
INTRAVENOUS | Status: DC
  Administered 2016-09-13 – 2016-09-14 (×3): via INTRAVENOUS

## 2016-09-13 MED ORDER — ORAL CARE MOUTH RINSE
15.0000 mL | Freq: Two times a day (BID) | OROMUCOSAL | Status: DC
  Administered 2016-09-13: 15 mL via OROMUCOSAL

## 2016-09-13 MED ORDER — PHENOBARBITAL 32.4 MG PO TABS
32.4000 mg | ORAL_TABLET | Freq: Every morning | ORAL | Status: DC
  Administered 2016-09-13 – 2016-09-17 (×5): 32.4 mg via ORAL
  Filled 2016-09-13 (×5): qty 1

## 2016-09-13 MED ORDER — PANTOPRAZOLE SODIUM 40 MG PO TBEC
40.0000 mg | DELAYED_RELEASE_TABLET | Freq: Two times a day (BID) | ORAL | Status: DC
  Administered 2016-09-13 – 2016-09-17 (×9): 40 mg via ORAL
  Filled 2016-09-13 (×9): qty 1

## 2016-09-13 MED ORDER — SODIUM CHLORIDE 0.9 % IV SOLN
Freq: Once | INTRAVENOUS | Status: AC
  Administered 2016-09-13: 14:00:00 via INTRAVENOUS

## 2016-09-13 MED ORDER — QUETIAPINE FUMARATE 25 MG PO TABS
50.0000 mg | ORAL_TABLET | Freq: Two times a day (BID) | ORAL | Status: DC
  Administered 2016-09-13 – 2016-09-17 (×10): 50 mg via ORAL
  Filled 2016-09-13: qty 1
  Filled 2016-09-13 (×4): qty 2
  Filled 2016-09-13: qty 1
  Filled 2016-09-13 (×6): qty 2
  Filled 2016-09-13: qty 1
  Filled 2016-09-13 (×2): qty 2
  Filled 2016-09-13: qty 1

## 2016-09-13 MED ORDER — SERTRALINE HCL 50 MG PO TABS
100.0000 mg | ORAL_TABLET | Freq: Every day | ORAL | Status: DC
  Administered 2016-09-13 – 2016-09-17 (×5): 100 mg via ORAL
  Filled 2016-09-13 (×5): qty 2

## 2016-09-13 MED ORDER — DOCUSATE SODIUM 100 MG PO CAPS
100.0000 mg | ORAL_CAPSULE | Freq: Every morning | ORAL | Status: DC
  Administered 2016-09-13 – 2016-09-17 (×5): 100 mg via ORAL
  Filled 2016-09-13 (×5): qty 1

## 2016-09-13 MED ORDER — QUETIAPINE FUMARATE 25 MG PO TABS
ORAL_TABLET | ORAL | Status: AC
  Filled 2016-09-13: qty 2

## 2016-09-13 NOTE — ED Notes (Signed)
Report given to [facility] in ICU

## 2016-09-13 NOTE — Care Management Note (Addendum)
Case Management Note  Patient Details  Name: Ian Hurley MRN: [id number] Date of Birth: 1966/10/01  Subjective/Objective:                  Pt admitted with AMS and found to have UTI. Pt is from [place]. Per facility Hhe is WC bound and requires assistance with ADL's. Anticipate pt will return to [facility] [place] is aware and will make arrangements for return to facility.   Action/Plan: No CM needs anticipated.   Expected Discharge Date:    09/15/2016              Expected Discharge Plan:  Group Home  In-House Referral:  Clinical Social Work  Discharge planning Services  CM Consult  Post Acute Care Choice:  NA Choice offered to:  NA  DME Arranged:    DME Agency:     HH Arranged:    HH Agency:     Status of Service:  Completed, signed off  If discussed at [employer], dates discussed:    Additional Comments:  Sherald Barge, RN 09/13/2016, 3:31 PM

## 2016-09-13 NOTE — Clinical Social Work Note (Signed)
Clinical Social Work Assessment  Patient Details  Name: Ian Hurley MRN: [id number] Date of Birth: 1966/05/08  Date of referral:  09/13/16               Reason for consult:  Facility Placement, Other (Comment Required) (From [place])                Permission sought to share information with:    Permission granted to share information::     Name::        Agency::     Relationship::     Contact Information:     Housing/Transportation Living arrangements for the past 2 months:   ([place]) Source of Information:  Facility Patient Interpreter Needed:  None Criminal Activity/Legal Involvement Pertinent to Current Situation/Hospitalization:  No - Comment as needed Significant Relationships:  Siblings Lives with:  Facility Resident Do you feel safe going back to the place where you live?  Yes Need for family participation in patient care:  Yes (Comment)  Care giving concerns:  Facility resident.    Social Worker assessment / plan:  CSW left a message for patient's sister.  CSW spoke with Katy Apo, staff at [facility]. Patient has been at the facility for years, is wheelchair bound, and his ADL's are assited by staff. She stated that patient has a supportive sister who visits with him.  She stated that patient is very talkative and can return to the facility at discharge.     Employment status:  Disabled (Comment on whether or not currently receiving Disability) Insurance information:  Medicare, Medicaid In [place] PT Recommendations:  Not assessed at this time Information / Referral to community resources:     Patient/Family's Response to care:  Message left to speak to family.   Patient/Family's Understanding of and Emotional Response to Diagnosis, Current Treatment, and Prognosis:  CSW will assess upon speaking with family.   Emotional Assessment Appearance:  Appears stated age Attitude/Demeanor/Rapport:  Unable to  Assess Affect (typically observed):  Unable to Assess Orientation:  Oriented to Self, Oriented to Place Alcohol / Substance use:  Not Applicable Psych involvement (Current and /or in the community):  No (Comment)  Discharge Needs  Concerns to be addressed:  No discharge needs identified, Other (Comment Required (Return to facility) Readmission within the last 30 days:  No Current discharge risk:  None Barriers to Discharge:  No Barriers Identified   Ihor Gully, LCSW 09/13/2016, 12:38 PM

## 2016-09-13 NOTE — Consult Note (Signed)
   [facility] CM Inpatient Consult   09/13/2016  Ian Hurley [birth year] [id number]  Patient screened for potential [facility] Management services. Patient is eligible for [place]. Electronic medical record reveals patient's discharge plan is return to group home setting, there were no identifiable [facility] care management needs at this time. [facility] Care Management services not appropriate at this time.  If patient's post hospital needs change please place a [facility] Care Management consult. For questions please contact:   [name]. [name], RN, BSN, [facility] Liaison [phone]

## 2016-09-13 NOTE — Progress Notes (Signed)
Subjective: Patient was admitted yesterday due to change in mental status due sepsis. He is receiving IV fluid and IV antibiotics.  Objective: Vital signs in last 24 hours: Temp:  [97 F (36.1 C)-98.4 F (36.9 C)] 97 F (36.1 C) (09/14 0400) Pulse Rate:  [103-126] 103 (09/14 0030) Resp:  [16-30] 19 (09/14 0030) BP: (99-135)/(69-90) 119/88 (09/14 0030) SpO2:  [94 %-100 %] 100 % (09/14 0030) Weight:  [68.6 kg (151 lb 3.8 oz)-72.6 kg (160 lb)] 68.6 kg (151 lb 3.8 oz) (09/14 0500) Weight change:     Intake/Output from previous day: 09/13 0701 - 09/14 0700 In: 2750 [IV Piggyback:2750] Out: 150 [Urine:150]  PHYSICAL EXAM General appearance: fatigued and no distress Resp: diminished breath sounds bilaterally and rhonchi bilaterally Cardio: S1, S2 normal GI: soft, non-tender; bowel sounds normal; no masses,  no organomegaly Extremities: extremities normal, atraumatic, no cyanosis or edema  Lab Results:  Results for orders placed or performed during the hospital encounter of 09/12/16 (from the past 48 hour(s))  Urinalysis, Routine w reflex microscopic (not at [facility])     Status: Abnormal   Collection Time: 09/12/16  4:05 PM  Result Value Ref Range   Color, Urine YELLOW YELLOW   APPearance TURBID (A) CLEAR   Specific Gravity, Urine 1.010 1.005 - 1.030   pH 6.5 5.0 - 8.0   Glucose, UA NEGATIVE NEGATIVE mg/dL   Hgb urine dipstick NEGATIVE NEGATIVE   Bilirubin Urine NEGATIVE NEGATIVE   Ketones, ur 15 (A) NEGATIVE mg/dL   Protein, ur 30 (A) NEGATIVE mg/dL   Nitrite POSITIVE (A) NEGATIVE   Leukocytes, UA LARGE (A) NEGATIVE  Urine microscopic-add on     Status: Abnormal   Collection Time: 09/12/16  4:05 PM  Result Value Ref Range   Squamous Epithelial / LPF TOO NUMEROUS TO COUNT (A) NONE SEEN   WBC, UA TOO NUMEROUS TO COUNT 0 - 5 WBC/hpf   RBC / HPF 6-30 0 - 5 RBC/hpf   Bacteria, UA MANY (A) NONE SEEN  Comprehensive metabolic panel     Status: Abnormal   Collection Time: 09/12/16   4:10 PM  Result Value Ref Range   Sodium 136 135 - 145 mmol/L   Potassium 3.3 (L) 3.5 - 5.1 mmol/L   Chloride 98 (L) 101 - 111 mmol/L   CO2 26 22 - 32 mmol/L   Glucose, Bld 106 (H) 65 - 99 mg/dL   BUN 10 6 - 20 mg/dL   Creatinine, Ser 0.51 (L) 0.61 - 1.24 mg/dL   Calcium 8.8 (L) 8.9 - 10.3 mg/dL   Total Protein 9.2 (H) 6.5 - 8.1 g/dL   Albumin 2.9 (L) 3.5 - 5.0 g/dL   AST 21 15 - 41 U/L   ALT 13 (L) 17 - 63 U/L   Alkaline Phosphatase 59 38 - 126 U/L   Total Bilirubin 0.6 0.3 - 1.2 mg/dL   GFR calc non Af Amer >60 >60 mL/min   GFR calc Af Amer >60 >60 mL/min    Comment: (NOTE) The eGFR has been calculated using the CKD EPI equation. This calculation has not been validated in all clinical situations. eGFR's persistently <60 mL/min signify possible Chronic Kidney Disease.    Anion gap 12 5 - 15  CBC WITH DIFFERENTIAL     Status: Abnormal   Collection Time: 09/12/16  4:10 PM  Result Value Ref Range   WBC 12.7 (H) 4.0 - 10.5 K/uL   RBC 4.70 4.22 - 5.81 MIL/uL   Hemoglobin 8.3 (L)  13.0 - 17.0 g/dL   HCT 32.4 (L) 39.0 - 52.0 %   MCV 68.9 (L) 78.0 - 100.0 fL   MCH 17.7 (L) 26.0 - 34.0 pg   MCHC 25.6 (L) 30.0 - 36.0 g/dL   RDW 24.3 (H) 11.5 - 15.5 %   Platelets 633 (H) 150 - 400 K/uL   Neutrophils Relative % 81 %   Lymphocytes Relative 12 %   Monocytes Relative 5 %   Eosinophils Relative 2 %   Basophils Relative 0 %   Neutro Abs 10.3 (H) 1.7 - 7.7 K/uL   Lymphs Abs 1.5 0.7 - 4.0 K/uL   Monocytes Absolute 0.6 0.1 - 1.0 K/uL   Eosinophils Absolute 0.3 0.0 - 0.7 K/uL   Basophils Absolute 0.0 0.0 - 0.1 K/uL   RBC Morphology POLYCHROMASIA PRESENT     Comment: STOMATOCYTES   WBC Morphology INCREASED BANDS (>20% BANDS)     Comment: ATYPICAL LYMPHOCYTES   Smear Review PLATELET COUNT CONFIRMED BY SMEAR     Comment: PLATELETS APPEAR INCREASED LARGE PLATELETS PRESENT GIANT PLATELETS SEEN   Blood Culture (routine x 2)     Status: None (Preliminary result)   Collection Time:  09/12/16  4:10 PM  Result Value Ref Range   Specimen Description BLOOD LEFT HAND    Special Requests BOTTLES DRAWN AEROBIC ONLY 4CC    Culture PENDING    Report Status PENDING   Blood Culture (routine x 2)     Status: None (Preliminary result)   Collection Time: 09/12/16  4:27 PM  Result Value Ref Range   Specimen Description BLOOD LEFT HAND    Special Requests BOTTLES DRAWN AEROBIC ONLY 4CC    Culture PENDING    Report Status PENDING   I-Stat CG4 Lactic Acid, ED  (not at  [facility])     Status: Abnormal   Collection Time: 09/12/16  4:32 PM  Result Value Ref Range   Lactic Acid, Venous 2.64 (HH) 0.5 - 1.9 mmol/L  I-Stat CG4 Lactic Acid, ED  (not at  [facility])     Status: None   Collection Time: 09/12/16  7:56 PM  Result Value Ref Range   Lactic Acid, Venous 1.47 0.5 - 1.9 mmol/L  MRSA PCR Screening     Status: None   Collection Time: 09/13/16 12:50 AM  Result Value Ref Range   MRSA by PCR NEGATIVE NEGATIVE    Comment:        The GeneXpert MRSA Assay (FDA approved for NASAL specimens only), is one component of a comprehensive MRSA colonization surveillance program. It is not intended to diagnose MRSA infection nor to guide or monitor treatment for MRSA infections.   CBC     Status: Abnormal   Collection Time: 09/13/16  4:53 AM  Result Value Ref Range   WBC 7.8 4.0 - 10.5 K/uL   RBC 4.19 (L) 4.22 - 5.81 MIL/uL   Hemoglobin 7.4 (L) 13.0 - 17.0 g/dL   HCT 29.4 (L) 39.0 - 52.0 %   MCV 70.2 (L) 78.0 - 100.0 fL   MCH 17.7 (L) 26.0 - 34.0 pg   MCHC 25.2 (L) 30.0 - 36.0 g/dL   RDW 24.6 (H) 11.5 - 15.5 %   Platelets 621 (H) 150 - 400 K/uL  Comprehensive metabolic panel     Status: Abnormal   Collection Time: 09/13/16  4:53 AM  Result Value Ref Range   Sodium 141 135 - 145 mmol/L   Potassium 3.1 (L) 3.5 - 5.1 mmol/L  Chloride 110 101 - 111 mmol/L   CO2 23 22 - 32 mmol/L   Glucose, Bld 84 65 - 99 mg/dL   BUN 7 6 - 20 mg/dL   Creatinine, Ser 0.39 (L) 0.61 - 1.24 mg/dL   Calcium  7.7 (L) 8.9 - 10.3 mg/dL   Total Protein 7.4 6.5 - 8.1 g/dL   Albumin 2.3 (L) 3.5 - 5.0 g/dL   AST 14 (L) 15 - 41 U/L   ALT 11 (L) 17 - 63 U/L   Alkaline Phosphatase 47 38 - 126 U/L   Total Bilirubin 0.3 0.3 - 1.2 mg/dL   GFR calc non Af Amer >60 >60 mL/min   GFR calc Af Amer >60 >60 mL/min    Comment: (NOTE) The eGFR has been calculated using the CKD EPI equation. This calculation has not been validated in all clinical situations. eGFR's persistently <60 mL/min signify possible Chronic Kidney Disease.    Anion gap 8 5 - 15    ABGS No results for input(s): PHART, PO2ART, TCO2, HCO3 in the last 72 hours.  Invalid input(s): PCO2 CULTURES Recent Results (from the past 240 hour(s))  Blood Culture (routine x 2)     Status: None (Preliminary result)   Collection Time: 09/12/16  4:10 PM  Result Value Ref Range Status   Specimen Description BLOOD LEFT HAND  Final   Special Requests BOTTLES DRAWN AEROBIC ONLY 4CC  Final   Culture PENDING  Incomplete   Report Status PENDING  Incomplete  Blood Culture (routine x 2)     Status: None (Preliminary result)   Collection Time: 09/12/16  4:27 PM  Result Value Ref Range Status   Specimen Description BLOOD LEFT HAND  Final   Special Requests BOTTLES DRAWN AEROBIC ONLY 4CC  Final   Culture PENDING  Incomplete   Report Status PENDING  Incomplete  MRSA PCR Screening     Status: None   Collection Time: 09/13/16 12:50 AM  Result Value Ref Range Status   MRSA by PCR NEGATIVE NEGATIVE Final    Comment:        The GeneXpert MRSA Assay (FDA approved for NASAL specimens only), is one component of a comprehensive MRSA colonization surveillance program. It is not intended to diagnose MRSA infection nor to guide or monitor treatment for MRSA infections.    Studies/Results: Dg Chest Port 1 View  Result Date: 09/12/2016 CLINICAL DATA:  Sepsis. EXAM: PORTABLE CHEST 1 VIEW COMPARISON:  Radiographs of March 18, 2015. FINDINGS: The heart size and  mediastinal contours are within normal limits. No pneumothorax or pleural effusion is noted. Lungs are clear. The visualized skeletal structures are unremarkable. IMPRESSION: No acute cardiopulmonary abnormality seen. Electronically Signed   By: Marijo Conception, M.D.   On: 09/12/2016 17:51    Medications: I have reviewed the patient's current medications.  Assesment:  Active Problems:   Cerebral palsy (HCC)   Depression   Sepsis ([place])   Pressure ulcer Severe anemia   Plan:  Medications reviewed Will continue IV fluid Will continue current treatment pending culture resul Will type cross match and transfuse 2 units of PRBC Will monitor CBC/BMP    LOS: 1 day   Mailynn Everly 09/13/2016, 8:32 AM

## 2016-09-14 LAB — CBC
HCT: 34.6 % — ABNORMAL LOW (ref 39.0–52.0)
HEMOGLOBIN: 9.8 g/dL — AB (ref 13.0–17.0)
MCH: 20.9 pg — AB (ref 26.0–34.0)
MCHC: 28.3 g/dL — AB (ref 30.0–36.0)
MCV: 73.6 fL — ABNORMAL LOW (ref 78.0–100.0)
PLATELETS: 527 10*3/uL — AB (ref 150–400)
RBC: 4.7 MIL/uL (ref 4.22–5.81)
RDW: 24.7 % — ABNORMAL HIGH (ref 11.5–15.5)
WBC: 8.1 10*3/uL (ref 4.0–10.5)

## 2016-09-14 LAB — TYPE AND SCREEN
ABO/RH(D): A POS
Antibody Screen: NEGATIVE
UNIT DIVISION: 0
Unit division: 0

## 2016-09-14 LAB — BASIC METABOLIC PANEL
Anion gap: 8 (ref 5–15)
BUN: 6 mg/dL (ref 6–20)
CHLORIDE: 109 mmol/L (ref 101–111)
CO2: 23 mmol/L (ref 22–32)
CREATININE: 0.4 mg/dL — AB (ref 0.61–1.24)
Calcium: 8.1 mg/dL — ABNORMAL LOW (ref 8.9–10.3)
Glucose, Bld: 121 mg/dL — ABNORMAL HIGH (ref 65–99)
POTASSIUM: 3.3 mmol/L — AB (ref 3.5–5.1)
SODIUM: 140 mmol/L (ref 135–145)

## 2016-09-14 NOTE — Clinical Social Work Note (Signed)
CSW updated [facility] on pt. Facility remains agreeable to return when medically stable.  Benay Pike, [place]

## 2016-09-14 NOTE — Care Management Note (Signed)
Case Management Note  Patient Details  Name: SHAROD ZIMMERLY MRN: [id number] Date of Birth: Nov 14, 1966   Expected Discharge Date:      09/17/2016            Expected Discharge Plan:  [place] (with home health services. )  In-House Referral:  Clinical Social Work  Ship broker  CM Consult  Post Acute Care Choice:  Home Health, Resumption of Svcs/PTA Provider Choice offered to:  NA  DME Arranged:    DME Agency:     HH Arranged:  RN, PT HH Agency:  [place]  Status of Service:  Completed, signed off  If discussed at [place] of Stay Meetings, dates discussed:    Additional Comments: Contacted by [employer] agency. Pt active with their nursing and PT services. [facility] will need orders to resume services at discharge. If pt discharges over weekend, RN will notify [facility] of pts DC. Pt is aware that [facility] has 48hrs to resume services at DC.  Sherald Barge, RN 09/14/2016, 1:36 PM

## 2016-09-14 NOTE — Care Management Important Message (Signed)
Important Message  Patient Details  Name: Ian Hurley MRN: [id number] Date of Birth: 07/28/1966   Medicare Important Message Given:  Yes    Sherald Barge, RN 09/14/2016, 1:40 PM

## 2016-09-14 NOTE — Progress Notes (Signed)
Subjective: Patient is more alert and awake. He is responding to verbal communications. No fever or chills. He has received two units of back RBC. Culture results pending.  Objective: Vital signs in last 24 hours: Temp:  [97 F (36.1 C)-98.4 F (36.9 C)] 98 F (36.7 C) (09/15 0750) Pulse Rate:  [108-130] 110 (09/15 0750) Resp:  [22-34] 25 (09/15 0750) BP: (96-148)/(67-110) 120/96 (09/15 0750) SpO2:  [97 %-100 %] 99 % (09/15 0750) Weight change:     Intake/Output from previous day: 09/14 0701 - 09/15 0700 In: 3515.2 [P.O.:360; I.V.:2126.8; Blood:978.3; IV Piggyback:50] Out: -   PHYSICAL EXAM General appearance: fatigued and no distress Resp: diminished breath sounds bilaterally and rhonchi bilaterally Cardio: S1, S2 normal GI: soft, non-tender; bowel sounds normal; no masses,  no organomegaly Extremities: extremities normal, atraumatic, no cyanosis or edema  Lab Results:  Results for orders placed or performed during the hospital encounter of 09/12/16 (from the past 48 hour(s))  Urinalysis, Routine w reflex microscopic (not at [facility])     Status: Abnormal   Collection Time: 09/12/16  4:05 PM  Result Value Ref Range   Color, Urine YELLOW YELLOW   APPearance TURBID (A) CLEAR   Specific Gravity, Urine 1.010 1.005 - 1.030   pH 6.5 5.0 - 8.0   Glucose, UA NEGATIVE NEGATIVE mg/dL   Hgb urine dipstick NEGATIVE NEGATIVE   Bilirubin Urine NEGATIVE NEGATIVE   Ketones, ur 15 (A) NEGATIVE mg/dL   Protein, ur 30 (A) NEGATIVE mg/dL   Nitrite POSITIVE (A) NEGATIVE   Leukocytes, UA LARGE (A) NEGATIVE  Urine microscopic-add on     Status: Abnormal   Collection Time: 09/12/16  4:05 PM  Result Value Ref Range   Squamous Epithelial / LPF TOO NUMEROUS TO COUNT (A) NONE SEEN   WBC, UA TOO NUMEROUS TO COUNT 0 - 5 WBC/hpf   RBC / HPF 6-30 0 - 5 RBC/hpf   Bacteria, UA MANY (A) NONE SEEN  Comprehensive metabolic panel     Status: Abnormal   Collection Time: 09/12/16  4:10 PM  Result Value  Ref Range   Sodium 136 135 - 145 mmol/L   Potassium 3.3 (L) 3.5 - 5.1 mmol/L   Chloride 98 (L) 101 - 111 mmol/L   CO2 26 22 - 32 mmol/L   Glucose, Bld 106 (H) 65 - 99 mg/dL   BUN 10 6 - 20 mg/dL   Creatinine, Ser 0.51 (L) 0.61 - 1.24 mg/dL   Calcium 8.8 (L) 8.9 - 10.3 mg/dL   Total Protein 9.2 (H) 6.5 - 8.1 g/dL   Albumin 2.9 (L) 3.5 - 5.0 g/dL   AST 21 15 - 41 U/L   ALT 13 (L) 17 - 63 U/L   Alkaline Phosphatase 59 38 - 126 U/L   Total Bilirubin 0.6 0.3 - 1.2 mg/dL   GFR calc non Af Amer >60 >60 mL/min   GFR calc Af Amer >60 >60 mL/min    Comment: (NOTE) The eGFR has been calculated using the CKD EPI equation. This calculation has not been validated in all clinical situations. eGFR's persistently <60 mL/min signify possible Chronic Kidney Disease.    Anion gap 12 5 - 15  CBC WITH DIFFERENTIAL     Status: Abnormal   Collection Time: 09/12/16  4:10 PM  Result Value Ref Range   WBC 12.7 (H) 4.0 - 10.5 K/uL   RBC 4.70 4.22 - 5.81 MIL/uL   Hemoglobin 8.3 (L) 13.0 - 17.0 g/dL   HCT 32.4 (L)  39.0 - 52.0 %   MCV 68.9 (L) 78.0 - 100.0 fL   MCH 17.7 (L) 26.0 - 34.0 pg   MCHC 25.6 (L) 30.0 - 36.0 g/dL   RDW 24.3 (H) 11.5 - 15.5 %   Platelets 633 (H) 150 - 400 K/uL   Neutrophils Relative % 81 %   Lymphocytes Relative 12 %   Monocytes Relative 5 %   Eosinophils Relative 2 %   Basophils Relative 0 %   Neutro Abs 10.3 (H) 1.7 - 7.7 K/uL   Lymphs Abs 1.5 0.7 - 4.0 K/uL   Monocytes Absolute 0.6 0.1 - 1.0 K/uL   Eosinophils Absolute 0.3 0.0 - 0.7 K/uL   Basophils Absolute 0.0 0.0 - 0.1 K/uL   RBC Morphology POLYCHROMASIA PRESENT     Comment: STOMATOCYTES   WBC Morphology INCREASED BANDS (>20% BANDS)     Comment: ATYPICAL LYMPHOCYTES   Smear Review PLATELET COUNT CONFIRMED BY SMEAR     Comment: PLATELETS APPEAR INCREASED LARGE PLATELETS PRESENT GIANT PLATELETS SEEN   Blood Culture (routine x 2)     Status: None (Preliminary result)   Collection Time: 09/12/16  4:10 PM  Result  Value Ref Range   Specimen Description BLOOD LEFT HAND    Special Requests BOTTLES DRAWN AEROBIC ONLY 4CC    Culture NO GROWTH < 24 HOURS    Report Status PENDING   Blood Culture (routine x 2)     Status: None (Preliminary result)   Collection Time: 09/12/16  4:27 PM  Result Value Ref Range   Specimen Description BLOOD LEFT HAND    Special Requests BOTTLES DRAWN AEROBIC ONLY 4CC    Culture NO GROWTH < 24 HOURS    Report Status PENDING   I-Stat CG4 Lactic Acid, ED  (not at  [facility])     Status: Abnormal   Collection Time: 09/12/16  4:32 PM  Result Value Ref Range   Lactic Acid, Venous 2.64 (HH) 0.5 - 1.9 mmol/L  I-Stat CG4 Lactic Acid, ED  (not at  [facility])     Status: None   Collection Time: 09/12/16  7:56 PM  Result Value Ref Range   Lactic Acid, Venous 1.47 0.5 - 1.9 mmol/L  MRSA PCR Screening     Status: None   Collection Time: 09/13/16 12:50 AM  Result Value Ref Range   MRSA by PCR NEGATIVE NEGATIVE    Comment:        The GeneXpert MRSA Assay (FDA approved for NASAL specimens only), is one component of a comprehensive MRSA colonization surveillance program. It is not intended to diagnose MRSA infection nor to guide or monitor treatment for MRSA infections.   CBC     Status: Abnormal   Collection Time: 09/13/16  4:53 AM  Result Value Ref Range   WBC 7.8 4.0 - 10.5 K/uL   RBC 4.19 (L) 4.22 - 5.81 MIL/uL   Hemoglobin 7.4 (L) 13.0 - 17.0 g/dL   HCT 29.4 (L) 39.0 - 52.0 %   MCV 70.2 (L) 78.0 - 100.0 fL   MCH 17.7 (L) 26.0 - 34.0 pg   MCHC 25.2 (L) 30.0 - 36.0 g/dL   RDW 24.6 (H) 11.5 - 15.5 %   Platelets 621 (H) 150 - 400 K/uL  Comprehensive metabolic panel     Status: Abnormal   Collection Time: 09/13/16  4:53 AM  Result Value Ref Range   Sodium 141 135 - 145 mmol/L   Potassium 3.1 (L) 3.5 - 5.1 mmol/L  Chloride 110 101 - 111 mmol/L   CO2 23 22 - 32 mmol/L   Glucose, Bld 84 65 - 99 mg/dL   BUN 7 6 - 20 mg/dL   Creatinine, Ser 0.39 (L) 0.61 - 1.24 mg/dL   Calcium  7.7 (L) 8.9 - 10.3 mg/dL   Total Protein 7.4 6.5 - 8.1 g/dL   Albumin 2.3 (L) 3.5 - 5.0 g/dL   AST 14 (L) 15 - 41 U/L   ALT 11 (L) 17 - 63 U/L   Alkaline Phosphatase 47 38 - 126 U/L   Total Bilirubin 0.3 0.3 - 1.2 mg/dL   GFR calc non Af Amer >60 >60 mL/min   GFR calc Af Amer >60 >60 mL/min    Comment: (NOTE) The eGFR has been calculated using the CKD EPI equation. This calculation has not been validated in all clinical situations. eGFR's persistently <60 mL/min signify possible Chronic Kidney Disease.    Anion gap 8 5 - 15  Prepare RBC     Status: None   Collection Time: 09/13/16 10:41 AM  Result Value Ref Range   Order Confirmation ORDER PROCESSED BY BLOOD BANK   Type and screen [facility]     Status: None (Preliminary result)   Collection Time: 09/13/16 10:41 AM  Result Value Ref Range   ABO/RH(D) A POS    Antibody Screen NEG    Sample Expiration 09/16/2016    Unit Number [id number]    Blood Component Type RED CELLS,LR    Unit division 00    Status of Unit ISSUED    Transfusion Status OK TO TRANSFUSE    Crossmatch Result Compatible    Unit Number [id number]    Blood Component Type RBC LR PHER2    Unit division 00    Status of Unit ISSUED    Transfusion Status OK TO TRANSFUSE    Crossmatch Result Compatible   CBC     Status: Abnormal   Collection Time: 09/13/16 11:47 PM  Result Value Ref Range   WBC 8.1 4.0 - 10.5 K/uL   RBC 4.70 4.22 - 5.81 MIL/uL   Hemoglobin 9.8 (L) 13.0 - 17.0 g/dL    Comment: DELTA CHECK NOTED POST TRANSFUSION SPECIMEN    HCT 34.6 (L) 39.0 - 52.0 %   MCV 73.6 (L) 78.0 - 100.0 fL   MCH 20.9 (L) 26.0 - 34.0 pg   MCHC 28.3 (L) 30.0 - 36.0 g/dL   RDW 24.7 (H) 11.5 - 15.5 %   Platelets 527 (H) 150 - 400 K/uL  Basic metabolic panel     Status: Abnormal   Collection Time: 09/13/16 11:47 PM  Result Value Ref Range   Sodium 140 135 - 145 mmol/L   Potassium 3.3 (L) 3.5 - 5.1 mmol/L   Chloride 109 101 - 111 mmol/L   CO2 23 22 - 32  mmol/L   Glucose, Bld 121 (H) 65 - 99 mg/dL   BUN 6 6 - 20 mg/dL   Creatinine, Ser 0.40 (L) 0.61 - 1.24 mg/dL   Calcium 8.1 (L) 8.9 - 10.3 mg/dL   GFR calc non Af Amer >60 >60 mL/min   GFR calc Af Amer >60 >60 mL/min    Comment: (NOTE) The eGFR has been calculated using the CKD EPI equation. This calculation has not been validated in all clinical situations. eGFR's persistently <60 mL/min signify possible Chronic Kidney Disease.    Anion gap 8 5 - 15    ABGS No results for input(s): PHART,  PO2ART, TCO2, HCO3 in the last 72 hours.  Invalid input(s): PCO2 CULTURES Recent Results (from the past 240 hour(s))  Blood Culture (routine x 2)     Status: None (Preliminary result)   Collection Time: 09/12/16  4:10 PM  Result Value Ref Range Status   Specimen Description BLOOD LEFT HAND  Final   Special Requests BOTTLES DRAWN AEROBIC ONLY 4CC  Final   Culture NO GROWTH < 24 HOURS  Final   Report Status PENDING  Incomplete  Blood Culture (routine x 2)     Status: None (Preliminary result)   Collection Time: 09/12/16  4:27 PM  Result Value Ref Range Status   Specimen Description BLOOD LEFT HAND  Final   Special Requests BOTTLES DRAWN AEROBIC ONLY 4CC  Final   Culture NO GROWTH < 24 HOURS  Final   Report Status PENDING  Incomplete  MRSA PCR Screening     Status: None   Collection Time: 09/13/16 12:50 AM  Result Value Ref Range Status   MRSA by PCR NEGATIVE NEGATIVE Final    Comment:        The GeneXpert MRSA Assay (FDA approved for NASAL specimens only), is one component of a comprehensive MRSA colonization surveillance program. It is not intended to diagnose MRSA infection nor to guide or monitor treatment for MRSA infections.    Studies/Results: Dg Chest Port 1 View  Result Date: 09/12/2016 CLINICAL DATA:  Sepsis. EXAM: PORTABLE CHEST 1 VIEW COMPARISON:  Radiographs of March 18, 2015. FINDINGS: The heart size and mediastinal contours are within normal limits. No pneumothorax  or pleural effusion is noted. Lungs are clear. The visualized skeletal structures are unremarkable. IMPRESSION: No acute cardiopulmonary abnormality seen. Electronically Signed   By: Marijo Conception, M.D.   On: 09/12/2016 17:51    Medications: I have reviewed the patient's current medications.  Assesment:  Active Problems:   Cerebral palsy (HCC)   Depression   Sepsis ([place])   Pressure ulcer Severe anemia   Plan:  Medications reviewed Will continue current treatment pending culture resul Continue current treatment Will monitor CBC/BMP    LOS: 2 days   Anelly Samarin 09/14/2016, 8:05 AM

## 2016-09-15 LAB — CBC
HCT: 37.9 % — ABNORMAL LOW (ref 39.0–52.0)
Hemoglobin: 10.7 g/dL — ABNORMAL LOW (ref 13.0–17.0)
MCH: 20.8 pg — AB (ref 26.0–34.0)
MCHC: 28.2 g/dL — AB (ref 30.0–36.0)
MCV: 73.7 fL — AB (ref 78.0–100.0)
PLATELETS: 661 10*3/uL — AB (ref 150–400)
RBC: 5.14 MIL/uL (ref 4.22–5.81)
RDW: 25.9 % — ABNORMAL HIGH (ref 11.5–15.5)
WBC: 7.9 10*3/uL (ref 4.0–10.5)

## 2016-09-15 LAB — BASIC METABOLIC PANEL
Anion gap: 8 (ref 5–15)
BUN: <5 mg/dL — ABNORMAL LOW (ref 6–20)
CALCIUM: 8.5 mg/dL — AB (ref 8.9–10.3)
CHLORIDE: 107 mmol/L (ref 101–111)
CO2: 22 mmol/L (ref 22–32)
CREATININE: 0.34 mg/dL — AB (ref 0.61–1.24)
GFR calc non Af Amer: >60 mL/min (ref >60)
GLUCOSE: 98 mg/dL (ref 65–99)
Potassium: 3 mmol/L — ABNORMAL LOW (ref 3.5–5.1)
Sodium: 137 mmol/L (ref 135–145)

## 2016-09-15 LAB — URINE CULTURE

## 2016-09-15 NOTE — Progress Notes (Signed)
Subjective: Patient is improving. He is alert and awake. He is responding to verbal communications appropriately. His urine culture is growing  E.coli but blood culture result is pending. Objective: Vital signs in last 24 hours: Temp:  [97.3 F (36.3 C)-98.6 F (37 C)] 97.8 F (36.6 C) (09/16 0611) Pulse Rate:  [110-123] 113 (09/16 0611) Resp:  [20-25] 20 (09/16 9622) BP: (120-153)/(90-99) 151/90 (09/16 0611) SpO2:  [98 %-100 %] 99 % (09/16 0611) Weight change:  Last BM Date: 09/14/16  Intake/Output from previous day: 09/15 0701 - 09/16 0700 In: 1312.7 [P.O.:476; I.V.:786.7; IV Piggyback:50] Out: 900 [Urine:900]  PHYSICAL EXAM General appearance: fatigued and no distress Resp: diminished breath sounds bilaterally and rhonchi bilaterally Cardio: S1, S2 normal GI: soft, non-tender; bowel sounds normal; no masses,  no organomegaly Extremities: extremities normal, atraumatic, no cyanosis or edema  Lab Results:  Results for orders placed or performed during the hospital encounter of 09/12/16 (from the past 48 hour(s))  Prepare RBC     Status: None   Collection Time: 09/13/16 10:41 AM  Result Value Ref Range   Order Confirmation ORDER PROCESSED BY BLOOD BANK   Type and screen [facility]     Status: None   Collection Time: 09/13/16 10:41 AM  Result Value Ref Range   ABO/RH(D) A POS    Antibody Screen NEG    Sample Expiration 09/16/2016    Unit Number [id number]    Blood Component Type RED CELLS,LR    Unit division 00    Status of Unit ISSUED,FINAL    Transfusion Status OK TO TRANSFUSE    Crossmatch Result Compatible    Unit Number [id number]    Blood Component Type RBC LR PHER2    Unit division 00    Status of Unit ISSUED,FINAL    Transfusion Status OK TO TRANSFUSE    Crossmatch Result Compatible   CBC     Status: Abnormal   Collection Time: 09/13/16 11:47 PM  Result Value Ref Range   WBC 8.1 4.0 - 10.5 K/uL   RBC 4.70 4.22 - 5.81 MIL/uL   Hemoglobin  9.8 (L) 13.0 - 17.0 g/dL    Comment: DELTA CHECK NOTED POST TRANSFUSION SPECIMEN    HCT 34.6 (L) 39.0 - 52.0 %   MCV 73.6 (L) 78.0 - 100.0 fL   MCH 20.9 (L) 26.0 - 34.0 pg   MCHC 28.3 (L) 30.0 - 36.0 g/dL   RDW 24.7 (H) 11.5 - 15.5 %   Platelets 527 (H) 150 - 400 K/uL  Basic metabolic panel     Status: Abnormal   Collection Time: 09/13/16 11:47 PM  Result Value Ref Range   Sodium 140 135 - 145 mmol/L   Potassium 3.3 (L) 3.5 - 5.1 mmol/L   Chloride 109 101 - 111 mmol/L   CO2 23 22 - 32 mmol/L   Glucose, Bld 121 (H) 65 - 99 mg/dL   BUN 6 6 - 20 mg/dL   Creatinine, Ser 0.40 (L) 0.61 - 1.24 mg/dL   Calcium 8.1 (L) 8.9 - 10.3 mg/dL   GFR calc non Af Amer >60 >60 mL/min   GFR calc Af Amer >60 >60 mL/min    Comment: (NOTE) The eGFR has been calculated using the CKD EPI equation. This calculation has not been validated in all clinical situations. eGFR's persistently <60 mL/min signify possible Chronic Kidney Disease.    Anion gap 8 5 - 15    ABGS No results for input(s): PHART, PO2ART, TCO2, HCO3 in  the last 72 hours.  Invalid input(s): PCO2 CULTURES Recent Results (from the past 240 hour(s))  Urine culture     Status: Abnormal (Preliminary result)   Collection Time: 09/12/16  4:05 PM  Result Value Ref Range Status   Specimen Description URINE, CATHETERIZED  Final   Special Requests NONE  Final   Culture >=100,000 COLONIES/mL ESCHERICHIA COLI (A)  Final   Report Status PENDING  Incomplete  Blood Culture (routine x 2)     Status: None (Preliminary result)   Collection Time: 09/12/16  4:10 PM  Result Value Ref Range Status   Specimen Description BLOOD LEFT HAND  Final   Special Requests BOTTLES DRAWN AEROBIC ONLY 4CC  Final   Culture NO GROWTH 2 DAYS  Final   Report Status PENDING  Incomplete  Blood Culture (routine x 2)     Status: None (Preliminary result)   Collection Time: 09/12/16  4:27 PM  Result Value Ref Range Status   Specimen Description BLOOD LEFT HAND  Final    Special Requests BOTTLES DRAWN AEROBIC ONLY 4CC  Final   Culture NO GROWTH 2 DAYS  Final   Report Status PENDING  Incomplete  MRSA PCR Screening     Status: None   Collection Time: 09/13/16 12:50 AM  Result Value Ref Range Status   MRSA by PCR NEGATIVE NEGATIVE Final    Comment:        The GeneXpert MRSA Assay (FDA approved for NASAL specimens only), is one component of a comprehensive MRSA colonization surveillance program. It is not intended to diagnose MRSA infection nor to guide or monitor treatment for MRSA infections.    Studies/Results: No results found.  Medications: I have reviewed the patient's current medications.  Assesment:  Active Problems:   Cerebral palsy (HCC)   Depression   Sepsis ([place])   Pressure ulcer Severe anemia   Plan:  Medications reviewed Will continue current treatment pending culture resul Continue current treatment Will monitor CBC/BMP    LOS: 3 days   Ever Gustafson 09/15/2016, 6:53 AM

## 2016-09-16 MED ORDER — LORAZEPAM 1 MG PO TABS: 1.0000 mg | ORAL_TABLET | Freq: Three times a day (TID) | ORAL | Status: DC | PRN

## 2016-09-16 NOTE — Progress Notes (Signed)
Dr Maudie Mercury by to see pt.  Per Dr Maudie Mercury appointment set for next Tuesday, Sept 26 at 0800 AM.  Notified sister.  Please notify sister when pt is discharged.

## 2016-09-16 NOTE — Progress Notes (Signed)
Notified Dr Maudie Mercury per family request that pt is in hospital.  Dr Maudie Mercury is new PCP for pt.

## 2016-09-16 NOTE — Progress Notes (Signed)
Subjective: Patient is resting. He is alert and awake. He is growing E.coli which is pan sensitive.. Objective: Vital signs in last 24 hours: Temp:  [97.7 F (36.5 C)-98.5 F (36.9 C)] 98.1 F (36.7 C) (09/17 1507) Pulse Rate:  [86-123] 110 (09/17 1507) Resp:  [18-20] 20 (09/17 1507) BP: (128-154)/(81-100) 131/86 (09/17 1507) SpO2:  [96 %-99 %] 99 % (09/17 1507) Weight change:  Last BM Date: 09/14/16  Intake/Output from previous day: 09/16 0701 - 09/17 0700 In: 873.8 [P.O.:600; I.V.:223.8; IV Piggyback:50] Out: 1700 [Urine:1700]  PHYSICAL EXAM General appearance: fatigued and no distress Resp: diminished breath sounds bilaterally and rhonchi bilaterally Cardio: S1, S2 normal GI: soft, non-tender; bowel sounds normal; no masses,  no organomegaly Extremities: extremities normal, atraumatic, no cyanosis or edema  Lab Results:  Results for orders placed or performed during the hospital encounter of 09/12/16 (from the past 48 hour(s))  Basic metabolic panel     Status: Abnormal   Collection Time: 09/15/16  6:10 AM  Result Value Ref Range   Sodium 137 135 - 145 mmol/L   Potassium 3.0 (L) 3.5 - 5.1 mmol/L   Chloride 107 101 - 111 mmol/L   CO2 22 22 - 32 mmol/L   Glucose, Bld 98 65 - 99 mg/dL   BUN <5 (L) 6 - 20 mg/dL   Creatinine, Ser 0.34 (L) 0.61 - 1.24 mg/dL   Calcium 8.5 (L) 8.9 - 10.3 mg/dL   GFR calc non Af Amer >60 >60 mL/min   GFR calc Af Amer >60 >60 mL/min    Comment: (NOTE) The eGFR has been calculated using the CKD EPI equation. This calculation has not been validated in all clinical situations. eGFR's persistently <60 mL/min signify possible Chronic Kidney Disease.    Anion gap 8 5 - 15  CBC     Status: Abnormal   Collection Time: 09/15/16  6:10 AM  Result Value Ref Range   WBC 7.9 4.0 - 10.5 K/uL   RBC 5.14 4.22 - 5.81 MIL/uL   Hemoglobin 10.7 (L) 13.0 - 17.0 g/dL   HCT 37.9 (L) 39.0 - 52.0 %   MCV 73.7 (L) 78.0 - 100.0 fL   MCH 20.8 (L) 26.0 - 34.0 pg    MCHC 28.2 (L) 30.0 - 36.0 g/dL   RDW 25.9 (H) 11.5 - 15.5 %   Platelets 661 (H) 150 - 400 K/uL    ABGS No results for input(s): PHART, PO2ART, TCO2, HCO3 in the last 72 hours.  Invalid input(s): PCO2 CULTURES Recent Results (from the past 240 hour(s))  Urine culture     Status: Abnormal   Collection Time: 09/12/16  4:05 PM  Result Value Ref Range Status   Specimen Description URINE, CATHETERIZED  Final   Special Requests NONE  Final   Culture >=100,000 COLONIES/mL ESCHERICHIA COLI (A)  Final   Report Status 09/15/2016 FINAL  Final   Organism ID, Bacteria ESCHERICHIA COLI (A)  Final      Susceptibility   Escherichia coli - MIC*    AMPICILLIN <=2 SENSITIVE Sensitive     CEFAZOLIN <=4 SENSITIVE Sensitive     CEFTRIAXONE <=1 SENSITIVE Sensitive     CIPROFLOXACIN <=0.25 SENSITIVE Sensitive     GENTAMICIN <=1 SENSITIVE Sensitive     IMIPENEM <=0.25 SENSITIVE Sensitive     NITROFURANTOIN <=16 SENSITIVE Sensitive     TRIMETH/SULFA <=20 SENSITIVE Sensitive     AMPICILLIN/SULBACTAM <=2 SENSITIVE Sensitive     PIP/TAZO <=4 SENSITIVE Sensitive     Extended  ESBL NEGATIVE Sensitive     * >=100,000 COLONIES/mL ESCHERICHIA COLI  Blood Culture (routine x 2)     Status: None (Preliminary result)   Collection Time: 09/12/16  4:10 PM  Result Value Ref Range Status   Specimen Description BLOOD LEFT HAND  Final   Special Requests BOTTLES DRAWN AEROBIC ONLY 4CC  Final   Culture NO GROWTH 2 DAYS  Final   Report Status PENDING  Incomplete  Blood Culture (routine x 2)     Status: None (Preliminary result)   Collection Time: 09/12/16  4:27 PM  Result Value Ref Range Status   Specimen Description BLOOD LEFT HAND  Final   Special Requests BOTTLES DRAWN AEROBIC ONLY 4CC  Final   Culture NO GROWTH 2 DAYS  Final   Report Status PENDING  Incomplete  MRSA PCR Screening     Status: None   Collection Time: 09/13/16 12:50 AM  Result Value Ref Range Status   MRSA by PCR NEGATIVE NEGATIVE Final     Comment:        The GeneXpert MRSA Assay (FDA approved for NASAL specimens only), is one component of a comprehensive MRSA colonization surveillance program. It is not intended to diagnose MRSA infection nor to guide or monitor treatment for MRSA infections.    Studies/Results: No results found.  Medications: I have reviewed the patient's current medications.  Assesment:  Active Problems:   Cerebral palsy (HCC)   Depression   Sepsis ([place])   Pressure ulcer Severe anemia   Plan:  Medications reviewed Continue current treatment Continue current treatment Plan to discharge in Am    LOS: 4 days   Ian Hurley 09/16/2016, 3:17 PM

## 2016-09-17 LAB — CULTURE, BLOOD (ROUTINE X 2)
CULTURE: NO GROWTH
Culture: NO GROWTH

## 2016-09-17 MED ORDER — CIPROFLOXACIN HCL 500 MG PO TABS: 500.0000 mg | ORAL_TABLET | Freq: Two times a day (BID) | ORAL | 0 refills | Status: DC

## 2016-09-17 MED ORDER — FUROSEMIDE 40 MG PO TABS: 20.0000 mg | ORAL_TABLET | Freq: Every day | ORAL | 0 refills | Status: DC

## 2016-09-17 NOTE — Discharge Summary (Signed)
Physician Discharge Summary  Patient ID: Ian Hurley MRN: [id number] DOB/AGE: Feb 28, 1966 50 y.o. Primary Care Physician:Lateesha Bezold, MD Admit date: 09/12/2016 Discharge date: 09/17/2016    Discharge Diagnoses:   Active Problems:   Cerebral palsy ([place])   Depression   Sepsis ([place])   Pressure ulcer     Medication List    TAKE these medications   BOUDREAUXS BUTT PASTE 40 % ointment Generic drug:  liver oil-zinc oxide Apply 1 application topically 2 (two) times daily as needed for irritation.   ciprofloxacin 500 MG tablet Commonly known as:  CIPRO Take 1 tablet (500 mg total) by mouth 2 (two) times daily.   cyclobenzaprine 10 MG tablet Commonly known as:  FLEXERIL Take 10 mg by mouth 3 (three) times daily.   docusate sodium 100 MG capsule Commonly known as:  COLACE Take 100 mg by mouth every morning.   furosemide 40 MG tablet Commonly known as:  LASIX Take 0.5 tablets (20 mg total) by mouth daily. *May take one tablet (40mg  total) daily as needed for edema* In addition to daily 40mg  regimen What changed:  how much to take   Iron Polysacch Cmplx-B12-FA 150-0.025-1 MG Caps Commonly known as:  POLY-IRON 150 FORTE Take 1 capsule by mouth daily.   LORazepam 1 MG tablet Commonly known as:  ATIVAN Take 1 mg by mouth every 8 (eight) hours as needed for anxiety.   OXYGEN Inhale 2 L into the lungs at bedtime.   pantoprazole 40 MG tablet Commonly known as:  PROTONIX Take 1 tablet (40 mg total) by mouth 2 (two) times daily before a meal.   PHENobarbital 64.8 MG tablet Commonly known as:  LUMINAL Take 32.4-64.8 mg by mouth 2 (two) times daily. Take one-half tablet by mouth in the morning and take one tablet at bedtime   potassium chloride SA 20 MEQ tablet Commonly known as:  K-DUR,KLOR-CON Take 40 mEq by mouth daily.   QUEtiapine 50 MG tablet Commonly known as:  SEROQUEL Take 50 mg by mouth 2 (two) times daily.   sertraline 100 MG tablet Commonly known as:   ZOLOFT Take 100 mg by mouth daily.   traMADol 50 MG tablet Commonly known as:  ULTRAM Take 50 mg by mouth 2 (two) times daily as needed for moderate pain.       Discharged Condition: improved    Consults: none  Significant Diagnostic Studies: Dg Chest Port 1 View  Result Date: 09/12/2016 CLINICAL DATA:  Sepsis. EXAM: PORTABLE CHEST 1 VIEW COMPARISON:  Radiographs of March 18, 2015. FINDINGS: The heart size and mediastinal contours are within normal limits. No pneumothorax or pleural effusion is noted. Lungs are clear. The visualized skeletal structures are unremarkable. IMPRESSION: No acute cardiopulmonary abnormality seen. Electronically Signed   By: Marijo Conception, M.D.   On: 09/12/2016 17:51    Lab Results: Basic Metabolic Panel:  Recent Labs  09/15/16 0610  NA 137  K 3.0*  CL 107  CO2 22  GLUCOSE 98  BUN <5*  CREATININE 0.34*  CALCIUM 8.5*   Liver Function Tests: No results for input(s): AST, ALT, ALKPHOS, BILITOT, PROT, ALBUMIN in the last 72 hours.   CBC:  Recent Labs  09/15/16 0610  WBC 7.9  HGB 10.7*  HCT 37.9*  MCV 73.7*  PLT 661*    Recent Results (from the past 240 hour(s))  Urine culture     Status: Abnormal   Collection Time: 09/12/16  4:05 PM  Result Value Ref Range Status  Specimen Description URINE, CATHETERIZED  Final   Special Requests NONE  Final   Culture >=100,000 COLONIES/mL ESCHERICHIA COLI (A)  Final   Report Status 09/15/2016 FINAL  Final   Organism ID, Bacteria ESCHERICHIA COLI (A)  Final      Susceptibility   Escherichia coli - MIC*    AMPICILLIN <=2 SENSITIVE Sensitive     CEFAZOLIN <=4 SENSITIVE Sensitive     CEFTRIAXONE <=1 SENSITIVE Sensitive     CIPROFLOXACIN <=0.25 SENSITIVE Sensitive     GENTAMICIN <=1 SENSITIVE Sensitive     IMIPENEM <=0.25 SENSITIVE Sensitive     NITROFURANTOIN <=16 SENSITIVE Sensitive     TRIMETH/SULFA <=20 SENSITIVE Sensitive     AMPICILLIN/SULBACTAM <=2 SENSITIVE Sensitive     PIP/TAZO  <=4 SENSITIVE Sensitive     Extended ESBL NEGATIVE Sensitive     * >=100,000 COLONIES/mL ESCHERICHIA COLI  Blood Culture (routine x 2)     Status: None (Preliminary result)   Collection Time: 09/12/16  4:10 PM  Result Value Ref Range Status   Specimen Description BLOOD LEFT HAND  Final   Special Requests BOTTLES DRAWN AEROBIC ONLY 4CC  Final   Culture NO GROWTH 2 DAYS  Final   Report Status PENDING  Incomplete  Blood Culture (routine x 2)     Status: None (Preliminary result)   Collection Time: 09/12/16  4:27 PM  Result Value Ref Range Status   Specimen Description BLOOD LEFT HAND  Final   Special Requests BOTTLES DRAWN AEROBIC ONLY 4CC  Final   Culture NO GROWTH 2 DAYS  Final   Report Status PENDING  Incomplete  MRSA PCR Screening     Status: None   Collection Time: 09/13/16 12:50 AM  Result Value Ref Range Status   MRSA by PCR NEGATIVE NEGATIVE Final    Comment:        The GeneXpert MRSA Assay (FDA approved for NASAL specimens only), is one component of a comprehensive MRSA colonization surveillance program. It is not intended to diagnose MRSA infection nor to guide or monitor treatment for MRSA infections.      Hospital Course:   This is a 50 years old male with history of multiple medical illnesses who admitted due to change in mental status. Patient was was found to have sepsis. He was admitted to ICU and was started with combinations of IV antibiotics and IV fluid. Patient rapidly improved. His urine grow E.Coli which is pan senitive. His blood culture was negative. Patient was also transfused 2 units of PRBC for drop in H/H. He is continued on iron supplement. Hee will discharge on oral Cipro for 5 days. Discharge Exam: Blood pressure 124/84, pulse (!) 110, temperature 97.4 F (36.3 C), temperature source Oral, resp. rate 16, height 5' (1.524 m), weight 68.6 kg (151 lb 3.8 oz), SpO2 96 %.   Disposition:  Assisted living.  He is in   [place], [place]. Go on 09/25/2016.   Specialty:  Internal Medicine Why:  Appointment at [place] information: [place] [address] [phone]        [facility], MD Follow up in 2 week(s).   Specialty:  Internal Medicine Contact information: [place] [address] [phone]           Signed: Rosita Fire   09/17/2016, 8:25 AM

## 2016-09-17 NOTE — Care Management Important Message (Signed)
Important Message  Patient Details  Name: JACCOB MONDO MRN: [id number] Date of Birth: 02/17/1966   Medicare Important Message Given:  Yes    Audrie Kuri, Chauncey Reading, RN 09/17/2016, 8:42 AM

## 2016-09-17 NOTE — NC FL2 (Signed)
[place] LEVEL OF CARE SCREENING TOOL     IDENTIFICATION  Patient Name: Ian Hurley Birthdate: 1966-12-18 Sex: male Admission Date (Current Location): 09/12/2016  [facility] and [place] Number:  [employer] and Address:  [place] [address], [place]      Provider Number: [id number]  Attending Physician Name and Address:  Rosita Fire, MD  Relative Name and Phone Number:  Santiago Glad, sister, [phone]    Current Level of Care: Hospital Recommended Level of Care: [place] Prior Approval Number:    Date Approved/Denied:   PASRR Number:    Discharge Plan: Other (Comment) ([place])    Current Diagnoses: Patient Active Problem List   Diagnosis Date Noted  . Pressure ulcer 09/13/2016  . Sepsis ([place]) 09/12/2016  . Constipation 07/19/2015  . Polycythemia 05/17/2015  . Anemia 05/21/2014  . Seizure disorder ([place]) 05/21/2014  . Anxiety 05/21/2014  . Cerebral palsy ([place]) 05/21/2014  . Depression 05/21/2014    Orientation RESPIRATION BLADDER Height & Weight     Self, Place  Normal Incontinent Weight: 151 lb 3.8 oz (68.6 kg) Height:  5' (152.4 cm)  BEHAVIORAL SYMPTOMS/MOOD NEUROLOGICAL BOWEL NUTRITION STATUS      Incontinent Diet (Regular)  AMBULATORY STATUS COMMUNICATION OF NEEDS Skin   Total Care Verbally PU Stage and Appropriate Care (Stage II, buttocks, right; stage III buttocks, left)                       Personal Care Assistance Level of Assistance  Bathing, Feeding, Dressing Bathing Assistance: Maximum assistance Feeding assistance: Maximum assistance Dressing Assistance: Maximum assistance     Functional Limitations Info  Sight, Hearing, Speech Sight Info: Adequate Hearing Info: Adequate Speech Info: Adequate    SPECIAL CARE FACTORS FREQUENCY                       Contractures Contractures Info: Present    Additional Factors Info  Psychotropic  Code Status Info: Full Code   Psychotropic Info: Ativan, Seroquel, Zoloft, Ultram         Current Medications (09/17/2016):  This is the current hospital active medication list Current Facility-Administered Medications  Medication Dose Route Frequency Provider Last Rate Last Dose  . 0.9 %  sodium chloride infusion   Intravenous Continuous Rosita Fire, MD 10 mL/hr at 09/15/16 1823    . cefTRIAXone (ROCEPHIN) 1 g in dextrose 5 % 50 mL IVPB  1 g Intravenous Q24H Orvan Falconer, MD   1 g at 09/16/16 2209  . docusate sodium (COLACE) capsule 100 mg  100 mg Oral q morning - 10a Orvan Falconer, MD   100 mg at 09/16/16 1006  . enoxaparin (LOVENOX) injection 40 mg  40 mg Subcutaneous Q24H Orvan Falconer, MD   40 mg at 09/16/16 2209  . LORazepam (ATIVAN) tablet 1 mg  1 mg Oral Q8H PRN Rosita Fire, MD      . pantoprazole (PROTONIX) EC tablet 40 mg  40 mg Oral BID AC Orvan Falconer, MD   40 mg at 09/17/16 0825  . PHENobarbital (LUMINAL) tablet 32.4 mg  32.4 mg Oral q morning - 10a Tesfaye Fanta, MD   32.4 mg at 09/16/16 1006  . PHENobarbital (LUMINAL) tablet 64.8 mg  64.8 mg Oral QHS Rosita Fire, MD   64.8 mg at 09/16/16 2209  . potassium chloride SA (K-DUR,KLOR-CON) CR tablet 40 mEq  40 mEq Oral  Daily Orvan Falconer, MD   40 mEq at 09/16/16 1006  . QUEtiapine (SEROQUEL) tablet 50 mg  50 mg Oral BID Orvan Falconer, MD   50 mg at 09/16/16 2209  . sertraline (ZOLOFT) tablet 100 mg  100 mg Oral Daily Orvan Falconer, MD   100 mg at 09/16/16 1006     Discharge Medications: TAKE these medications   BOUDREAUXS BUTT PASTE 40 % ointment Generic drug:  liver oil-zinc oxide Apply 1 application topically 2 (two) times daily as needed for irritation.  ciprofloxacin 500 MG tablet Commonly known as:  CIPRO Take 1 tablet (500 mg total) by mouth 2 (two) times daily.  cyclobenzaprine 10 MG tablet Commonly known as:  FLEXERIL Take 10 mg by mouth 3 (three) times daily.  docusate sodium 100 MG capsule Commonly known as:  COLACE Take 100 mg by mouth  every morning.  furosemide 40 MG tablet Commonly known as:  LASIX Take 0.5 tablets (20 mg total) by mouth daily. *May take one tablet (40mg  total) daily as needed for edema* In addition to daily 40mg  regimen What changed:  how much to take  Iron Polysacch Cmplx-B12-FA 150-0.025-1 MG Caps Commonly known as:  POLY-IRON 150 FORTE Take 1 capsule by mouth daily.  LORazepam 1 MG tablet Commonly known as:  ATIVAN Take 1 mg by mouth every 8 (eight) hours as needed for anxiety.  OXYGEN Inhale 2 L into the lungs at bedtime.  pantoprazole 40 MG tablet Commonly known as:  PROTONIX Take 1 tablet (40 mg total) by mouth 2 (two) times daily before a meal.  PHENobarbital 64.8 MG tablet Commonly known as:  LUMINAL Take 32.4-64.8 mg by mouth 2 (two) times daily. Take one-half tablet by mouth in the morning and take one tablet at bedtime  potassium chloride SA 20 MEQ tablet Commonly known as:  K-DUR,KLOR-CON Take 40 mEq by mouth daily.  QUEtiapine 50 MG tablet Commonly known as:  SEROQUEL Take 50 mg by mouth 2 (two) times daily.  sertraline 100 MG tablet Commonly known as:  ZOLOFT Take 100 mg by mouth daily.  traMADol 50 MG tablet Commonly known as:  ULTRAM Take 50 mg by mouth 2 (two) times daily as needed for moderate pain.       Relevant Imaging Results:  Relevant Lab Results:   Additional Information    [name], [name], LCSW

## 2016-09-17 NOTE — Progress Notes (Signed)
Pt has orders for discharge. IV and telemetry removed; IV site WNL. EMS called.  Pt unable to verbalize understanding of medication regimen, discharge instructions and hospital stay. Pt awaiting transportation.

## 2016-09-17 NOTE — Clinical Social Work Note (Signed)
CSW notified patient sister that patient was discharging today and would return to the facility.    CSW notified facility administrator, Carin Primrose, that patient was discharging today and would be returning to the facility via [facility].  CSW sent clinicals via [employer].  CSW signing off.    Kaaren Nass, [name], LCSW

## 2016-09-18 DIAGNOSIS — G40909 Epilepsy, unspecified, not intractable, without status epilepticus: Secondary | ICD-10-CM | POA: Diagnosis not present

## 2016-09-18 DIAGNOSIS — R600 Localized edema: Secondary | ICD-10-CM | POA: Diagnosis not present

## 2016-09-18 DIAGNOSIS — L988 Other specified disorders of the skin and subcutaneous tissue: Secondary | ICD-10-CM | POA: Diagnosis not present

## 2016-09-18 DIAGNOSIS — F329 Major depressive disorder, single episode, unspecified: Secondary | ICD-10-CM | POA: Diagnosis not present

## 2016-09-18 DIAGNOSIS — G809 Cerebral palsy, unspecified: Secondary | ICD-10-CM | POA: Diagnosis not present

## 2016-09-18 DIAGNOSIS — F419 Anxiety disorder, unspecified: Secondary | ICD-10-CM | POA: Diagnosis not present

## 2016-09-21 DIAGNOSIS — F419 Anxiety disorder, unspecified: Secondary | ICD-10-CM | POA: Diagnosis not present

## 2016-09-21 DIAGNOSIS — G809 Cerebral palsy, unspecified: Secondary | ICD-10-CM | POA: Diagnosis not present

## 2016-09-21 DIAGNOSIS — L988 Other specified disorders of the skin and subcutaneous tissue: Secondary | ICD-10-CM | POA: Diagnosis not present

## 2016-09-21 DIAGNOSIS — G40822 Epileptic spasms, not intractable, without status epilepticus: Secondary | ICD-10-CM | POA: Diagnosis not present

## 2016-09-21 DIAGNOSIS — R600 Localized edema: Secondary | ICD-10-CM | POA: Diagnosis not present

## 2016-09-21 DIAGNOSIS — F329 Major depressive disorder, single episode, unspecified: Secondary | ICD-10-CM | POA: Diagnosis not present

## 2016-09-21 DIAGNOSIS — D509 Iron deficiency anemia, unspecified: Secondary | ICD-10-CM | POA: Diagnosis not present

## 2016-09-21 DIAGNOSIS — G40909 Epilepsy, unspecified, not intractable, without status epilepticus: Secondary | ICD-10-CM | POA: Diagnosis not present

## 2016-09-23 DIAGNOSIS — R600 Localized edema: Secondary | ICD-10-CM | POA: Diagnosis not present

## 2016-09-23 DIAGNOSIS — F329 Major depressive disorder, single episode, unspecified: Secondary | ICD-10-CM | POA: Diagnosis not present

## 2016-09-23 DIAGNOSIS — L89892 Pressure ulcer of other site, stage 2: Secondary | ICD-10-CM | POA: Diagnosis not present

## 2016-09-23 DIAGNOSIS — B9620 Unspecified Escherichia coli [E. coli] as the cause of diseases classified elsewhere: Secondary | ICD-10-CM | POA: Diagnosis not present

## 2016-09-23 DIAGNOSIS — G809 Cerebral palsy, unspecified: Secondary | ICD-10-CM | POA: Diagnosis not present

## 2016-09-23 DIAGNOSIS — N390 Urinary tract infection, site not specified: Secondary | ICD-10-CM | POA: Diagnosis not present

## 2016-09-23 DIAGNOSIS — G40909 Epilepsy, unspecified, not intractable, without status epilepticus: Secondary | ICD-10-CM | POA: Diagnosis not present

## 2016-09-23 DIAGNOSIS — F419 Anxiety disorder, unspecified: Secondary | ICD-10-CM | POA: Diagnosis not present

## 2016-09-25 DIAGNOSIS — G809 Cerebral palsy, unspecified: Secondary | ICD-10-CM | POA: Diagnosis not present

## 2016-09-25 DIAGNOSIS — Z7409 Other reduced mobility: Secondary | ICD-10-CM | POA: Diagnosis not present

## 2016-09-25 DIAGNOSIS — D638 Anemia in other chronic diseases classified elsewhere: Secondary | ICD-10-CM | POA: Diagnosis not present

## 2016-09-25 DIAGNOSIS — I10 Essential (primary) hypertension: Secondary | ICD-10-CM | POA: Diagnosis not present

## 2016-09-25 DIAGNOSIS — F800 Phonological disorder: Secondary | ICD-10-CM | POA: Diagnosis not present

## 2016-09-26 DIAGNOSIS — G809 Cerebral palsy, unspecified: Secondary | ICD-10-CM | POA: Diagnosis not present

## 2016-09-26 DIAGNOSIS — I10 Essential (primary) hypertension: Secondary | ICD-10-CM | POA: Diagnosis not present

## 2016-09-26 DIAGNOSIS — D45 Polycythemia vera: Secondary | ICD-10-CM | POA: Diagnosis not present

## 2016-09-26 DIAGNOSIS — G4089 Other seizures: Secondary | ICD-10-CM | POA: Diagnosis not present

## 2016-09-26 DIAGNOSIS — R000 Tachycardia, unspecified: Secondary | ICD-10-CM | POA: Diagnosis not present

## 2016-09-26 DIAGNOSIS — F419 Anxiety disorder, unspecified: Secondary | ICD-10-CM | POA: Diagnosis not present

## 2016-09-26 DIAGNOSIS — Z79899 Other long term (current) drug therapy: Secondary | ICD-10-CM | POA: Diagnosis not present

## 2016-09-26 DIAGNOSIS — F329 Major depressive disorder, single episode, unspecified: Secondary | ICD-10-CM | POA: Diagnosis not present

## 2016-09-27 ENCOUNTER — Encounter (HOSPITAL_COMMUNITY): Payer: Medicare Other | Attending: Oncology | Admitting: Oncology

## 2016-09-27 ENCOUNTER — Encounter (HOSPITAL_COMMUNITY): Payer: Self-pay | Admitting: Oncology

## 2016-09-27 ENCOUNTER — Encounter (HOSPITAL_COMMUNITY): Payer: Medicare Other

## 2016-09-27 VITALS — BP 126/90 | HR 130 | Temp 98.1°F | Resp 18

## 2016-09-27 DIAGNOSIS — K2980 Duodenitis without bleeding: Secondary | ICD-10-CM | POA: Diagnosis not present

## 2016-09-27 DIAGNOSIS — Z87891 Personal history of nicotine dependence: Secondary | ICD-10-CM | POA: Insufficient documentation

## 2016-09-27 DIAGNOSIS — G809 Cerebral palsy, unspecified: Secondary | ICD-10-CM | POA: Diagnosis not present

## 2016-09-27 DIAGNOSIS — F329 Major depressive disorder, single episode, unspecified: Secondary | ICD-10-CM | POA: Diagnosis not present

## 2016-09-27 DIAGNOSIS — Z79899 Other long term (current) drug therapy: Secondary | ICD-10-CM | POA: Diagnosis not present

## 2016-09-27 DIAGNOSIS — D751 Secondary polycythemia: Secondary | ICD-10-CM

## 2016-09-27 DIAGNOSIS — R0902 Hypoxemia: Secondary | ICD-10-CM | POA: Diagnosis not present

## 2016-09-27 DIAGNOSIS — F419 Anxiety disorder, unspecified: Secondary | ICD-10-CM | POA: Insufficient documentation

## 2016-09-27 DIAGNOSIS — D500 Iron deficiency anemia secondary to blood loss (chronic): Secondary | ICD-10-CM

## 2016-09-27 LAB — CBC WITH DIFFERENTIAL/PLATELET
BASOS ABS: 0.1 10*3/uL (ref 0.0–0.1)
BASOS PCT: 1 %
EOS ABS: 0.6 10*3/uL (ref 0.0–0.7)
Eosinophils Relative: 9 %
HCT: 38.5 % — ABNORMAL LOW (ref 39.0–52.0)
Hemoglobin: 11.4 g/dL — ABNORMAL LOW (ref 13.0–17.0)
LYMPHS ABS: 1.7 10*3/uL (ref 0.7–4.0)
Lymphocytes Relative: 26 %
MCH: 22.5 pg — ABNORMAL LOW (ref 26.0–34.0)
MCHC: 29.6 g/dL — AB (ref 30.0–36.0)
MCV: 75.9 fL — ABNORMAL LOW (ref 78.0–100.0)
Monocytes Absolute: 0.6 10*3/uL (ref 0.1–1.0)
Monocytes Relative: 9 %
NEUTROS ABS: 3.6 10*3/uL (ref 1.7–7.7)
Neutrophils Relative %: 55 %
PLATELETS: 332 10*3/uL (ref 150–400)
RBC: 5.07 MIL/uL (ref 4.22–5.81)
RDW: 27.2 % — AB (ref 11.5–15.5)
WBC: 6.6 10*3/uL (ref 4.0–10.5)

## 2016-09-27 LAB — FERRITIN: Ferritin: 32 ng/mL (ref 24–336)

## 2016-09-27 NOTE — Progress Notes (Signed)
[place], MD [place] [address]  Polycythemia - Plan: CBC with Differential, Iron and TIBC, Ferritin, Renal function panel  CURRENT THERAPY: Observation  INTERVAL HISTORY: Ian Hurley 50 y.o. male returns for followup of Initially, patient was noted to have a JAK2 V617F mutation negative, Exon 12 and 13 mutation negative, CALR mutation negative Polycythemia with an elevated EPO level.  Sleep study on 08/23/2015 is suggestive of hypoxia while sleeping.  However, he has now developed a mild anemia which is being followed.  Recent hospitalization for UTI is noted.  He notes symptoms have resolved.  He notes that he is doing well.  Weight is loss is noted.  He notes that his appetite comes and goes.  He reports eating well when he is hungry.  He is followed by Dr. Legrand Rams.   Recent follow-up with Neurology is noted as well.  Patient confirms no recent seizure activity.  Review of Systems  Constitutional: Positive for weight loss. Negative for chills and fever.  HENT: Negative.   Eyes: Negative.   Respiratory: Negative.  Negative for cough.   Cardiovascular: Negative.  Negative for chest pain.  Gastrointestinal: Negative.  Negative for blood in stool, constipation, diarrhea, melena, nausea and vomiting.  Genitourinary: Negative for dysuria, flank pain, frequency, hematuria and urgency.  Musculoskeletal: Negative.   Skin: Negative.   Neurological: Negative.   Endo/Heme/Allergies: Negative.   Psychiatric/Behavioral: Negative.     Past Medical History:  Diagnosis Date  . Anemia   . Anxiety   . 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 Cerebral palsy ([place])   . Depression   . Duodenitis MAY 2015   HB 6.6 MCV 63 FERRITIN 6  . Erosive esophagitis MAY 2015   HB 6.6 MCV 63 FERRITIN 6  . Polycythemia 05/17/2015  . Seizures ([place])     Past Surgical History:  Procedure Laterality Date  . COLONOSCOPY N/A 05/24/2014   [id number] rectal polyp otherwise normal  . ESOPHAGOGASTRODUODENOSCOPY N/A 05/23/2014   [id number] HH/mild non-erosive gastritis/small gastric polyp  . FLEXIBLE SIGMOIDOSCOPY N/A 05/23/2014   SLF: formed stool in colon and large amount of liquid stool    History reviewed. No pertinent family history.  Social History   Social History  . Marital status: Single    Spouse name: N/A  . Number of children: N/A  . Years of education: N/A   Social History Main Topics  . Smoking status: Former Smoker    Types: Cigarettes    Quit date: 01/16/2014  . Smokeless tobacco: Never Used     Comment: Former smoker  . Alcohol use No  . Drug use: No  . Sexual activity: Not Asked   Other Topics Concern  . None   Social History Narrative  . None     PHYSICAL EXAMINATION  ECOG PERFORMANCE STATUS: 3 - Symptomatic, >50% confined to bed  Vitals:   09/27/16 1500  BP: 126/90  Pulse: (!) 130  Resp: 18  Temp: 98.1 F (36.7 C)    GENERAL:alert, no distress, comfortable, cooperative and in wheelchair, accompanied by nursing aid, weight loss is noted. SKIN: skin color, texture, turgor are normal, no rashes or significant lesions HEAD: Normocephalic, No masses, lesions, tenderness or abnormalities EYES: normal, EOMI, Conjunctiva are pink and non-injected EARS: External ears normal OROPHARYNX:lips, buccal mucosa, and tongue normal, mucous membranes are moist and poor dentition.  NECK: supple, no adenopathy, trachea midline LYMPH:  no palpable lymphadenopathy BREAST:not examined LUNGS: clear to auscultation  HEART: regular rate & rhythm ABDOMEN:abdomen soft and normal bowel sounds BACK: Back symmetric, no curvature. EXTREMITIES:less then 2 second capillary refill, no skin discoloration, no cyanosis, positive findings:  edema B/L LE edema, compression stockings in place.  NEURO: alert & oriented x 3 with fluent speech, no focal motor/sensory deficits, in wheelchair, Cerebral Palsy  LABORATORY  DATA: CBC    Component Value Date/Time   WBC 6.6 09/27/2016 1355   RBC 5.07 09/27/2016 1355   HGB 11.4 (L) 09/27/2016 1355   HCT 38.5 (L) 09/27/2016 1355   PLT 332 09/27/2016 1355   MCV 75.9 (L) 09/27/2016 1355   MCH 22.5 (L) 09/27/2016 1355   MCHC 29.6 (L) 09/27/2016 1355   RDW 27.2 (H) 09/27/2016 1355   LYMPHSABS 1.7 09/27/2016 1355   MONOABS 0.6 09/27/2016 1355   EOSABS 0.6 09/27/2016 1355   BASOSABS 0.1 09/27/2016 1355      Chemistry      Component Value Date/Time   NA 137 09/15/2016 0610   K 3.0 (L) 09/15/2016 0610   CL 107 09/15/2016 0610   CO2 22 09/15/2016 0610   BUN <5 (L) 09/15/2016 0610   CREATININE 0.34 (L) 09/15/2016 0610      Component Value Date/Time   CALCIUM 8.5 (L) 09/15/2016 0610   ALKPHOS 47 09/13/2016 0453   AST 14 (L) 09/13/2016 0453   ALT 11 (L) 09/13/2016 0453   BILITOT 0.3 09/13/2016 0453      Lab Results  Component Value Date   IRON 23 (L) 03/08/2016   TIBC 414 03/08/2016   FERRITIN 25 03/08/2016    PENDING LABS:   RADIOGRAPHIC STUDIES:  Dg Chest Port 1 View  Result Date: 09/12/2016 CLINICAL DATA:  Sepsis. EXAM: PORTABLE CHEST 1 VIEW COMPARISON:  Radiographs of March 18, 2015. FINDINGS: The heart size and mediastinal contours are within normal limits. No pneumothorax or pleural effusion is noted. Lungs are clear. The visualized skeletal structures are unremarkable. IMPRESSION: No acute cardiopulmonary abnormality seen. Electronically Signed   By: Marijo Conception, M.D.   On: 09/12/2016 17:51     PATHOLOGY:    ASSESSMENT AND PLAN:  Polycythemia Initially, patient was noted to have a JAK2 V617F mutation negative, Exon 12 and 13 mutation negative, CALR mutation negative Polycythemia with an elevated EPO level.  Sleep study on 08/23/2015 is suggestive of hypoxia while sleeping.  However, he has now developed a mild anemia which is being followed.  Labs today: CBC diff, ferritin.  I personally reviewed and went over laboratory results  with the patient.  The results are noted within this dictation. Mild anemia with microcytosis is noted.  This is stable.  Refer to Dr. Merlene Laughter for further evaluation and management of HS hypoxia.  This is likely the cause of the patient's polycythemia.    Labs in 3 months: CBC diff, iron/TIBC, ferritin, renal function panel.  Return in 3 months for follow-up.   ORDERS PLACED FOR THIS ENCOUNTER: Orders Placed This Encounter  Procedures  . CBC with Differential  . Iron and TIBC  . Ferritin  . Renal function panel    MEDICATIONS PRESCRIBED THIS ENCOUNTER: Meds ordered this encounter  Medications  . carvedilol (COREG) 3.125 MG tablet  . furosemide (LASIX) 20 MG tablet    THERAPY PLAN:  Ongoing observation.  Will replace iron if indicated.  All questions were answered. The patient knows to call the clinic with any problems, questions or concerns. We  can certainly see the patient much sooner if necessary.  Patient and plan discussed with Dr. Ancil Linsey and she is in agreement with the aforementioned.   This note is electronically signed by: Doy Mince 09/27/2016 7:17 PM

## 2016-09-27 NOTE — Patient Instructions (Signed)
[place] at [facility] Discharge Instructions  RECOMMENDATIONS MADE BY THE CONSULTANT AND ANY TEST RESULTS WILL BE SENT TO YOUR REFERRING PHYSICIAN.  You were seen by Gershon Mussel today Return to center in 3 months for follow up and labs  Thank you for choosing [place] at [facility] to provide your oncology and hematology care.  To afford each patient quality time with our provider, please arrive at least 15 minutes before your scheduled appointment time.   Beginning January 23rd 2017 lab work for the [facility] will be done in the  Main lab at [facility] on 1st floor. If you have a lab appointment with the [facility] please come in thru the  Main Entrance and check in at the main information desk  You need to re-schedule your appointment should you arrive 10 or more minutes late.  We strive to give you quality time with our providers, and arriving late affects you and other patients whose appointments are after yours.  Also, if you no show three or more times for appointments you may be dismissed from the clinic at the providers discretion.     Again, thank you for choosing [facility].  Our hope is that these requests will decrease the amount of time that you wait before being seen by our physicians.       _____________________________________________________________  Should you have questions after your visit to [facility], please contact our office at [phone] between the hours of 8:30 a.m. and 4:30 p.m.  Voicemails left after 4:30 p.m. will not be returned until the following business day.  For prescription refill requests, have your pharmacy contact our office.         Resources For Cancer Patients and their Caregivers ? American Cancer Society: Can assist with transportation, wigs, general needs, runs Look Good Feel Better.        [phone] ? Cancer Care: Provides financial assistance, online  support groups, medication/co-pay assistance.  1-800-813-HOPE (918)447-2631) ? [place] Assists [place] cancer patients and their families through emotional , educational and financial support.  608-540-9920 ? [place] DSS Where to apply for food stamps, Medicaid and utility assistance. [phone] ? RCATS: Transportation to medical appointments. [phone] ? Social Security Administration: May apply for disability if have a Stage IV cancer. [phone] ? [employer], Disability and Transit Services: Assists with nutrition, care and transit needs. [place] Support Programs: @10RELATIVEDAYS @ > Cancer Support Group  2nd Tuesday of the month 1pm-2pm, Journey Room  > Creative Journey  3rd Tuesday of the month 1130am-1pm, Journey Room  > Look Good Feel Better  1st Wednesday of the month 10am-12 noon, Journey Room (Call [place] to register [phone])

## 2016-09-27 NOTE — Assessment & Plan Note (Signed)
Initially, patient was noted to have a JAK2 V617F mutation negative, Exon 12 and 13 mutation negative, CALR mutation negative Polycythemia with an elevated EPO level.  Sleep study on 08/23/2015 is suggestive of hypoxia while sleeping.  However, he has now developed a mild anemia which is being followed.  Labs today: CBC diff, ferritin.  I personally reviewed and went over laboratory results with the patient.  The results are noted within this dictation. Mild anemia with microcytosis is noted.  This is stable.  Refer to Dr. Merlene Laughter for further evaluation and management of HS hypoxia.  This is likely the cause of the patient's polycythemia.    Labs in 3 months: CBC diff, iron/TIBC, ferritin, renal function panel.  Return in 3 months for follow-up.

## 2016-09-28 DIAGNOSIS — B9620 Unspecified Escherichia coli [E. coli] as the cause of diseases classified elsewhere: Secondary | ICD-10-CM | POA: Diagnosis not present

## 2016-09-28 DIAGNOSIS — L89892 Pressure ulcer of other site, stage 2: Secondary | ICD-10-CM | POA: Diagnosis not present

## 2016-09-28 DIAGNOSIS — R600 Localized edema: Secondary | ICD-10-CM | POA: Diagnosis not present

## 2016-09-28 DIAGNOSIS — N390 Urinary tract infection, site not specified: Secondary | ICD-10-CM | POA: Diagnosis not present

## 2016-09-28 DIAGNOSIS — G40909 Epilepsy, unspecified, not intractable, without status epilepticus: Secondary | ICD-10-CM | POA: Diagnosis not present

## 2016-09-28 DIAGNOSIS — G809 Cerebral palsy, unspecified: Secondary | ICD-10-CM | POA: Diagnosis not present

## 2016-10-02 DIAGNOSIS — N390 Urinary tract infection, site not specified: Secondary | ICD-10-CM | POA: Diagnosis not present

## 2016-10-02 DIAGNOSIS — R600 Localized edema: Secondary | ICD-10-CM | POA: Diagnosis not present

## 2016-10-02 DIAGNOSIS — B9620 Unspecified Escherichia coli [E. coli] as the cause of diseases classified elsewhere: Secondary | ICD-10-CM | POA: Diagnosis not present

## 2016-10-02 DIAGNOSIS — G40909 Epilepsy, unspecified, not intractable, without status epilepticus: Secondary | ICD-10-CM | POA: Diagnosis not present

## 2016-10-02 DIAGNOSIS — L89892 Pressure ulcer of other site, stage 2: Secondary | ICD-10-CM | POA: Diagnosis not present

## 2016-10-02 DIAGNOSIS — G809 Cerebral palsy, unspecified: Secondary | ICD-10-CM | POA: Diagnosis not present

## 2016-10-05 DIAGNOSIS — I10 Essential (primary) hypertension: Secondary | ICD-10-CM | POA: Diagnosis not present

## 2016-10-05 DIAGNOSIS — G40909 Epilepsy, unspecified, not intractable, without status epilepticus: Secondary | ICD-10-CM | POA: Diagnosis not present

## 2016-10-05 DIAGNOSIS — N390 Urinary tract infection, site not specified: Secondary | ICD-10-CM | POA: Diagnosis not present

## 2016-10-05 DIAGNOSIS — L89892 Pressure ulcer of other site, stage 2: Secondary | ICD-10-CM | POA: Diagnosis not present

## 2016-10-05 DIAGNOSIS — B9620 Unspecified Escherichia coli [E. coli] as the cause of diseases classified elsewhere: Secondary | ICD-10-CM | POA: Diagnosis not present

## 2016-10-05 DIAGNOSIS — R600 Localized edema: Secondary | ICD-10-CM | POA: Diagnosis not present

## 2016-10-05 DIAGNOSIS — G809 Cerebral palsy, unspecified: Secondary | ICD-10-CM | POA: Diagnosis not present

## 2016-10-09 DIAGNOSIS — L89892 Pressure ulcer of other site, stage 2: Secondary | ICD-10-CM | POA: Diagnosis not present

## 2016-10-09 DIAGNOSIS — G40909 Epilepsy, unspecified, not intractable, without status epilepticus: Secondary | ICD-10-CM | POA: Diagnosis not present

## 2016-10-09 DIAGNOSIS — D751 Secondary polycythemia: Secondary | ICD-10-CM | POA: Diagnosis not present

## 2016-10-09 DIAGNOSIS — R600 Localized edema: Secondary | ICD-10-CM | POA: Diagnosis not present

## 2016-10-09 DIAGNOSIS — B9620 Unspecified Escherichia coli [E. coli] as the cause of diseases classified elsewhere: Secondary | ICD-10-CM | POA: Diagnosis not present

## 2016-10-09 DIAGNOSIS — G809 Cerebral palsy, unspecified: Secondary | ICD-10-CM | POA: Diagnosis not present

## 2016-10-09 DIAGNOSIS — N390 Urinary tract infection, site not specified: Secondary | ICD-10-CM | POA: Diagnosis not present

## 2016-10-12 DIAGNOSIS — G809 Cerebral palsy, unspecified: Secondary | ICD-10-CM | POA: Diagnosis not present

## 2016-10-12 DIAGNOSIS — L89892 Pressure ulcer of other site, stage 2: Secondary | ICD-10-CM | POA: Diagnosis not present

## 2016-10-12 DIAGNOSIS — B9620 Unspecified Escherichia coli [E. coli] as the cause of diseases classified elsewhere: Secondary | ICD-10-CM | POA: Diagnosis not present

## 2016-10-12 DIAGNOSIS — R600 Localized edema: Secondary | ICD-10-CM | POA: Diagnosis not present

## 2016-10-12 DIAGNOSIS — N390 Urinary tract infection, site not specified: Secondary | ICD-10-CM | POA: Diagnosis not present

## 2016-10-12 DIAGNOSIS — G40909 Epilepsy, unspecified, not intractable, without status epilepticus: Secondary | ICD-10-CM | POA: Diagnosis not present

## 2016-10-16 DIAGNOSIS — N390 Urinary tract infection, site not specified: Secondary | ICD-10-CM | POA: Diagnosis not present

## 2016-10-16 DIAGNOSIS — B9620 Unspecified Escherichia coli [E. coli] as the cause of diseases classified elsewhere: Secondary | ICD-10-CM | POA: Diagnosis not present

## 2016-10-16 DIAGNOSIS — R600 Localized edema: Secondary | ICD-10-CM | POA: Diagnosis not present

## 2016-10-16 DIAGNOSIS — G40909 Epilepsy, unspecified, not intractable, without status epilepticus: Secondary | ICD-10-CM | POA: Diagnosis not present

## 2016-10-16 DIAGNOSIS — L89892 Pressure ulcer of other site, stage 2: Secondary | ICD-10-CM | POA: Diagnosis not present

## 2016-10-16 DIAGNOSIS — G809 Cerebral palsy, unspecified: Secondary | ICD-10-CM | POA: Diagnosis not present

## 2016-11-06 DIAGNOSIS — Z7409 Other reduced mobility: Secondary | ICD-10-CM | POA: Diagnosis not present

## 2016-11-06 DIAGNOSIS — G809 Cerebral palsy, unspecified: Secondary | ICD-10-CM | POA: Diagnosis not present

## 2016-11-06 DIAGNOSIS — I10 Essential (primary) hypertension: Secondary | ICD-10-CM | POA: Diagnosis not present

## 2016-11-06 DIAGNOSIS — F800 Phonological disorder: Secondary | ICD-10-CM | POA: Diagnosis not present

## 2016-11-06 DIAGNOSIS — D638 Anemia in other chronic diseases classified elsewhere: Secondary | ICD-10-CM | POA: Diagnosis not present

## 2016-12-27 ENCOUNTER — Other Ambulatory Visit (HOSPITAL_COMMUNITY): Payer: Self-pay

## 2016-12-28 ENCOUNTER — Encounter (HOSPITAL_COMMUNITY): Payer: Medicare Other | Attending: Oncology | Admitting: Oncology

## 2016-12-28 ENCOUNTER — Encounter (HOSPITAL_COMMUNITY): Payer: Medicare Other

## 2016-12-28 ENCOUNTER — Encounter (HOSPITAL_COMMUNITY): Payer: Self-pay | Admitting: Oncology

## 2016-12-28 ENCOUNTER — Other Ambulatory Visit (HOSPITAL_COMMUNITY): Payer: Self-pay

## 2016-12-28 DIAGNOSIS — D751 Secondary polycythemia: Secondary | ICD-10-CM | POA: Diagnosis not present

## 2016-12-28 LAB — RENAL FUNCTION PANEL
ALBUMIN: 3.9 g/dL (ref 3.5–5.0)
Anion gap: 8 (ref 5–15)
BUN: 14 mg/dL (ref 6–20)
CALCIUM: 8.7 mg/dL — AB (ref 8.9–10.3)
CO2: 27 mmol/L (ref 22–32)
CREATININE: 0.48 mg/dL — AB (ref 0.61–1.24)
Chloride: 103 mmol/L (ref 101–111)
Glucose, Bld: 80 mg/dL (ref 65–99)
PHOSPHORUS: 2.1 mg/dL — AB (ref 2.5–4.6)
Potassium: 3.3 mmol/L — ABNORMAL LOW (ref 3.5–5.1)
SODIUM: 138 mmol/L (ref 135–145)

## 2016-12-28 LAB — CBC WITH DIFFERENTIAL/PLATELET
Basophils Absolute: 0 10*3/uL (ref 0.0–0.1)
Basophils Relative: 0 %
EOS ABS: 0.3 10*3/uL (ref 0.0–0.7)
Eosinophils Relative: 5 %
HCT: 49.5 % (ref 39.0–52.0)
HEMOGLOBIN: 15.2 g/dL (ref 13.0–17.0)
LYMPHS ABS: 1.5 10*3/uL (ref 0.7–4.0)
LYMPHS PCT: 22 %
MCH: 29.5 pg (ref 26.0–34.0)
MCHC: 30.7 g/dL (ref 30.0–36.0)
MCV: 95.9 fL (ref 78.0–100.0)
Monocytes Absolute: 0.6 10*3/uL (ref 0.1–1.0)
Monocytes Relative: 9 %
NEUTROS PCT: 64 %
Neutro Abs: 4.3 10*3/uL (ref 1.7–7.7)
Platelets: 304 10*3/uL (ref 150–400)
RBC: 5.16 MIL/uL (ref 4.22–5.81)
RDW: 17.6 % — ABNORMAL HIGH (ref 11.5–15.5)
WBC: 6.8 10*3/uL (ref 4.0–10.5)

## 2016-12-28 LAB — IRON AND TIBC
Iron: 24 ug/dL — ABNORMAL LOW (ref 45–182)
Saturation Ratios: 7 % — ABNORMAL LOW (ref 17.9–39.5)
TIBC: 340 ug/dL (ref 250–450)
UIBC: 316 ug/dL

## 2016-12-28 LAB — FERRITIN: FERRITIN: 84 ng/mL (ref 24–336)

## 2016-12-28 NOTE — Progress Notes (Signed)
[place], MD [place] [address]  Polycythemia - Plan: CBC with Differential, Basic metabolic panel, Iron and TIBC, Ferritin  CURRENT THERAPY: Observation  INTERVAL HISTORY: Ian Hurley 49 y.o. male returns for followup of Initially, patient was noted to have a JAK2 V617F mutation negative, Exon 12 and 13 mutation negative, CALR mutation negative Polycythemia with an elevated EPO level.  Sleep study on 08/23/2015 is suggestive of hypoxia while sleeping.  However, he has now developed an intermittent, mild anemia which is being followed.  He is doing well.  He denies any blood in his stool or dark stool.  He denies any hematuria and hemoptysis.  He wants me to decrease his fluid pill because it makes him urinate all day and interrupt the things he wants to do.  He reports that he must be mindful of where the closest restroom is and plans his day around this after taking his Lasix.  His Lasix is managed by Dr. Legrand Rams.  He otherwise denies any complaints.  Review of Systems  Constitutional: Negative.  Negative for chills and fever.  HENT: Negative.   Eyes: Negative.   Respiratory: Negative.  Negative for cough.   Cardiovascular: Negative.  Negative for chest pain.  Gastrointestinal: Negative.  Negative for blood in stool, constipation, diarrhea, melena, nausea and vomiting.  Genitourinary: Positive for frequency and urgency. Negative for dysuria, flank pain and hematuria.  Musculoskeletal: Negative.   Skin: Negative.   Neurological: Negative.   Endo/Heme/Allergies: Negative.   Psychiatric/Behavioral: Negative.     Past Medical History:  Diagnosis Date  . Anemia   . Anxiety   . 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 Cerebral palsy ([place])   . Depression   . Duodenitis MAY 2015   HB 6.6 MCV 63 FERRITIN 6  . Erosive esophagitis MAY 2015   HB 6.6 MCV 63 FERRITIN 6  . Polycythemia 05/17/2015  . Seizures ([place])      Past Surgical History:  Procedure Laterality Date  . COLONOSCOPY N/A 05/24/2014   [id number] rectal polyp otherwise normal  . ESOPHAGOGASTRODUODENOSCOPY N/A 05/23/2014   [id number] HH/mild non-erosive gastritis/small gastric polyp  . FLEXIBLE SIGMOIDOSCOPY N/A 05/23/2014   SLF: formed stool in colon and large amount of liquid stool    History reviewed. No pertinent family history.  Social History   Social History  . Marital status: Single    Spouse name: N/A  . Number of children: N/A  . Years of education: N/A   Social History Main Topics  . Smoking status: Former Smoker    Types: Cigarettes    Quit date: 01/16/2014  . Smokeless tobacco: Never Used     Comment: Former smoker  . Alcohol use No  . Drug use: No  . Sexual activity: Not Asked   Other Topics Concern  . None   Social History Narrative  . None     PHYSICAL EXAMINATION  ECOG PERFORMANCE STATUS: 3 - Symptomatic, >50% confined to bed  Vitals:   12/28/16 1348  BP: 129/87  Pulse: (!) 101  Resp: 20  Temp: 98.3 F (36.8 C)    GENERAL:alert, no distress, comfortable, cooperative and in wheelchair, accompanied by nursing aid. SKIN: skin color, texture, turgor are normal, no rashes or significant lesions HEAD: Normocephalic, No masses, lesions, tenderness or abnormalities EYES: normal, EOMI, Conjunctiva are pink and non-injected EARS: External ears normal OROPHARYNX:lips, buccal mucosa, and tongue normal, mucous  membranes are moist and poor dentition.  NECK: supple, no adenopathy, trachea midline LYMPH:  no palpable lymphadenopathy BREAST:not examined LUNGS: clear to auscultation  HEART: regular rate & rhythm ABDOMEN:abdomen soft and normal bowel sounds BACK: Back symmetric, no curvature. EXTREMITIES:less then 2 second capillary refill, no skin discoloration, no cyanosis, positive findings:  edema B/L LE edema, compression stockings in place.  NEURO: alert & oriented x 3 with fluent speech, no focal  motor/sensory deficits, in wheelchair, Cerebral Palsy  LABORATORY DATA: CBC    Component Value Date/Time   WBC 6.8 12/28/2016 1305   RBC 5.16 12/28/2016 1305   HGB 15.2 12/28/2016 1305   HCT 49.5 12/28/2016 1305   PLT 304 12/28/2016 1305   MCV 95.9 12/28/2016 1305   MCH 29.5 12/28/2016 1305   MCHC 30.7 12/28/2016 1305   RDW 17.6 (H) 12/28/2016 1305   LYMPHSABS 1.5 12/28/2016 1305   MONOABS 0.6 12/28/2016 1305   EOSABS 0.3 12/28/2016 1305   BASOSABS 0.0 12/28/2016 1305      Chemistry      Component Value Date/Time   NA 138 12/28/2016 1306   K 3.3 (L) 12/28/2016 1306   CL 103 12/28/2016 1306   CO2 27 12/28/2016 1306   BUN 14 12/28/2016 1306   CREATININE 0.48 (L) 12/28/2016 1306      Component Value Date/Time   CALCIUM 8.7 (L) 12/28/2016 1306   ALKPHOS 47 09/13/2016 0453   AST 14 (L) 09/13/2016 0453   ALT 11 (L) 09/13/2016 0453   BILITOT 0.3 09/13/2016 0453      Lab Results  Component Value Date   IRON 23 (L) 03/08/2016   TIBC 414 03/08/2016   FERRITIN 32 09/27/2016    PENDING LABS:   RADIOGRAPHIC STUDIES:  No results found.   PATHOLOGY:    ASSESSMENT AND PLAN:  Polycythemia Initially, patient was noted to have a JAK2 V617F mutation negative, Exon 12 and 13 mutation negative, CALR mutation negative Polycythemia with an elevated EPO level.  Sleep study on 08/23/2015 is suggestive of hypoxia while sleeping.  However, he has now developed a mild anemia which is being followed.  Labs today: CBC diff, iron/TIBC, ferritin.  I personally reviewed and went over laboratory results with the patient.  The results are noted within this dictation. HGB is WNL along with other blood counts.  Iron studies are pending at this time.    Labs in 6 months: CBC diff, BMET, iron/TIBC, ferritin.  He asks me to decrease his fluid pill (Lasix) because it interrupts his day to use the restroom frequency.  Lasix is managed by his primary care provider, Dr. Legrand Rams, and I will defer  any changes in this medication to him.  Patient is advised of this and understands.  Return in 6 months for follow-up.   ORDERS PLACED FOR THIS ENCOUNTER: Orders Placed This Encounter  Procedures  . CBC with Differential  . Basic metabolic panel  . Iron and TIBC  . Ferritin    MEDICATIONS PRESCRIBED THIS ENCOUNTER: No orders of the defined types were placed in this encounter.   THERAPY PLAN:  Ongoing observation.  Will replace iron if indicated.  All questions were answered. The patient knows to call the clinic with any problems, questions or concerns. We can certainly see the patient much sooner if necessary.  Patient and plan discussed with Dr. Ancil Linsey and she is in agreement with the aforementioned.   This note is electronically signed by: Doy Mince 12/28/2016 4:02 PM

## 2016-12-28 NOTE — Assessment & Plan Note (Addendum)
Initially, patient was noted to have a JAK2 V617F mutation negative, Exon 12 and 13 mutation negative, CALR mutation negative Polycythemia with an elevated EPO level.  Sleep study on 08/23/2015 is suggestive of hypoxia while sleeping.  However, he has now developed a mild anemia which is being followed.  Labs today: CBC diff, iron/TIBC, ferritin.  I personally reviewed and went over laboratory results with the patient.  The results are noted within this dictation. HGB is WNL along with other blood counts.  Iron studies are pending at this time.    Labs in 6 months: CBC diff, BMET, iron/TIBC, ferritin.  He asks me to decrease his fluid pill (Lasix) because it interrupts his day to use the restroom frequency.  Lasix is managed by his primary care provider, Dr. Legrand Rams, and I will defer any changes in this medication to him.  Patient is advised of this and understands.  Return in 6 months for follow-up.

## 2016-12-28 NOTE — Patient Instructions (Signed)
[place] at [facility] Discharge Instructions  RECOMMENDATIONS MADE BY THE CONSULTANT AND ANY TEST RESULTS WILL BE SENT TO YOUR REFERRING PHYSICIAN.  You were seen today by Kirby Crigler, PA-C Please go back down to lab before you leave the hospital today for repeat labwork Follow up in 6 months with labwork  Thank you for choosing [place] at [facility] to provide your oncology and hematology care.  To afford each patient quality time with our provider, please arrive at least 15 minutes before your scheduled appointment time.    If you have a lab appointment with the [facility] please come in thru the  Main Entrance and check in at the main information desk  You need to re-schedule your appointment should you arrive 10 or more minutes late.  We strive to give you quality time with our providers, and arriving late affects you and other patients whose appointments are after yours.  Also, if you no show three or more times for appointments you may be dismissed from the clinic at the providers discretion.     Again, thank you for choosing [facility].  Our hope is that these requests will decrease the amount of time that you wait before being seen by our physicians.       _____________________________________________________________  Should you have questions after your visit to [facility], please contact our office at [phone] between the hours of 8:30 a.m. and 4:30 p.m.  Voicemails left after 4:30 p.m. will not be returned until the following business day.  For prescription refill requests, have your pharmacy contact our office.       Resources For Cancer Patients and their Caregivers ? American Cancer Society: Can assist with transportation, wigs, general needs, runs Look Good Feel Better.        407-305-3163 ? Cancer Care: Provides financial assistance, online support groups, medication/co-pay  assistance.  1-800-813-HOPE 936-459-9699) ? [place] Assists [place] cancer patients and their families through emotional , educational and financial support.  [phone] ? [place] DSS Where to apply for food stamps, Medicaid and utility assistance. 646-659-7743 ? RCATS: Transportation to medical appointments. 830-834-5342 ? Social Security Administration: May apply for disability if have a Stage IV cancer. [phone] ? [employer], Disability and Transit Services: Assists with nutrition, care and transit needs. [place] Support Programs: @10RELATIVEDAYS @ > Cancer Support Group  2nd Tuesday of the month 1pm-2pm, Journey Room  > Creative Journey  3rd Tuesday of the month 1130am-1pm, Journey Room  > Look Good Feel Better  1st Wednesday of the month 10am-12 noon, Journey Room (Call [place] to register [phone])

## 2017-01-14 IMAGING — CR DG CHEST 1V PORT
1 series · 1 of 1 positions shown · non-contrast
Comparison: Radiographs March 18, 2015.

CLINICAL DATA: Sepsis.

EXAM:
PORTABLE CHEST 1 VIEW

[ap portable]
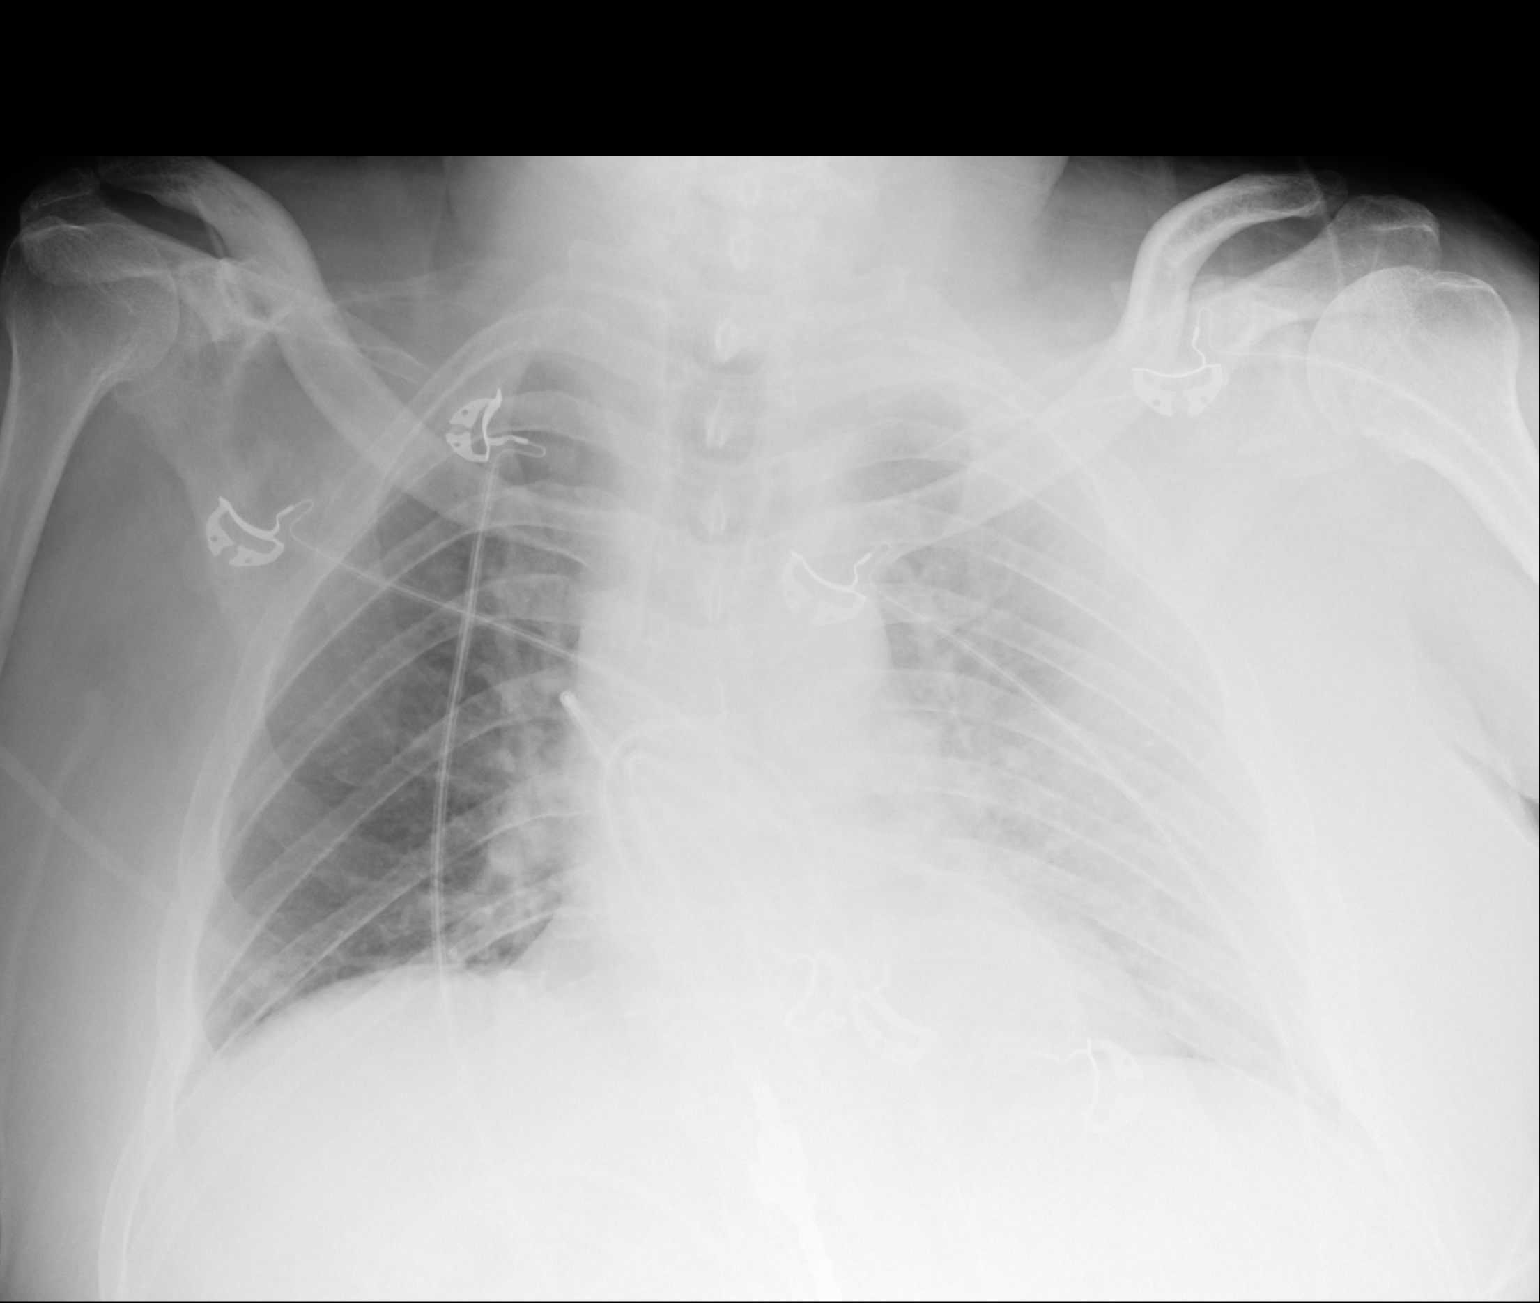

[1 of 1 positions shown; findings below may reference images not displayed]

FINDINGS: The heart size and mediastinal contours are within normal limits. No
pneumothorax or pleural effusion is noted. Lungs are clear. The
visualized skeletal structures are unremarkable.
IMPRESSION: No acute cardiopulmonary abnormality seen.

## 2017-01-24 DIAGNOSIS — F419 Anxiety disorder, unspecified: Secondary | ICD-10-CM | POA: Diagnosis not present

## 2017-01-24 DIAGNOSIS — Z7409 Other reduced mobility: Secondary | ICD-10-CM | POA: Diagnosis not present

## 2017-01-24 DIAGNOSIS — G809 Cerebral palsy, unspecified: Secondary | ICD-10-CM | POA: Diagnosis not present

## 2017-02-27 ENCOUNTER — Other Ambulatory Visit (HOSPITAL_COMMUNITY): Payer: Self-pay | Admitting: Oncology

## 2017-02-27 DIAGNOSIS — D509 Iron deficiency anemia, unspecified: Secondary | ICD-10-CM

## 2017-03-13 DIAGNOSIS — Z7409 Other reduced mobility: Secondary | ICD-10-CM | POA: Diagnosis not present

## 2017-03-13 DIAGNOSIS — R600 Localized edema: Secondary | ICD-10-CM | POA: Diagnosis not present

## 2017-03-13 DIAGNOSIS — F419 Anxiety disorder, unspecified: Secondary | ICD-10-CM | POA: Diagnosis not present

## 2017-03-13 DIAGNOSIS — R4789 Other speech disturbances: Secondary | ICD-10-CM | POA: Diagnosis not present

## 2017-03-13 DIAGNOSIS — G4089 Other seizures: Secondary | ICD-10-CM | POA: Diagnosis not present

## 2017-03-13 DIAGNOSIS — F800 Phonological disorder: Secondary | ICD-10-CM | POA: Diagnosis not present

## 2017-03-13 DIAGNOSIS — G809 Cerebral palsy, unspecified: Secondary | ICD-10-CM | POA: Diagnosis not present

## 2017-03-13 DIAGNOSIS — I10 Essential (primary) hypertension: Secondary | ICD-10-CM | POA: Diagnosis not present

## 2017-03-13 DIAGNOSIS — D751 Secondary polycythemia: Secondary | ICD-10-CM | POA: Diagnosis not present

## 2017-03-14 DIAGNOSIS — F419 Anxiety disorder, unspecified: Secondary | ICD-10-CM | POA: Diagnosis not present

## 2017-03-14 DIAGNOSIS — R739 Hyperglycemia, unspecified: Secondary | ICD-10-CM | POA: Diagnosis not present

## 2017-03-14 DIAGNOSIS — G809 Cerebral palsy, unspecified: Secondary | ICD-10-CM | POA: Diagnosis not present

## 2017-03-14 DIAGNOSIS — R4789 Other speech disturbances: Secondary | ICD-10-CM | POA: Diagnosis not present

## 2017-03-14 DIAGNOSIS — G4089 Other seizures: Secondary | ICD-10-CM | POA: Diagnosis not present

## 2017-03-14 DIAGNOSIS — I10 Essential (primary) hypertension: Secondary | ICD-10-CM | POA: Diagnosis not present

## 2017-03-14 DIAGNOSIS — R292 Abnormal reflex: Secondary | ICD-10-CM | POA: Diagnosis not present

## 2017-03-14 DIAGNOSIS — Z7409 Other reduced mobility: Secondary | ICD-10-CM | POA: Diagnosis not present

## 2017-03-14 DIAGNOSIS — E559 Vitamin D deficiency, unspecified: Secondary | ICD-10-CM | POA: Diagnosis not present

## 2017-03-14 DIAGNOSIS — D649 Anemia, unspecified: Secondary | ICD-10-CM | POA: Diagnosis not present

## 2017-03-14 DIAGNOSIS — F800 Phonological disorder: Secondary | ICD-10-CM | POA: Diagnosis not present

## 2017-04-15 DIAGNOSIS — I10 Essential (primary) hypertension: Secondary | ICD-10-CM | POA: Diagnosis not present

## 2017-04-15 DIAGNOSIS — G809 Cerebral palsy, unspecified: Secondary | ICD-10-CM | POA: Diagnosis not present

## 2017-04-15 DIAGNOSIS — Z79899 Other long term (current) drug therapy: Secondary | ICD-10-CM | POA: Diagnosis not present

## 2017-04-15 DIAGNOSIS — F329 Major depressive disorder, single episode, unspecified: Secondary | ICD-10-CM | POA: Diagnosis not present

## 2017-04-15 DIAGNOSIS — R000 Tachycardia, unspecified: Secondary | ICD-10-CM | POA: Diagnosis not present

## 2017-04-15 DIAGNOSIS — D45 Polycythemia vera: Secondary | ICD-10-CM | POA: Diagnosis not present

## 2017-04-15 DIAGNOSIS — G4089 Other seizures: Secondary | ICD-10-CM | POA: Diagnosis not present

## 2017-04-15 DIAGNOSIS — F419 Anxiety disorder, unspecified: Secondary | ICD-10-CM | POA: Diagnosis not present

## 2017-04-16 DIAGNOSIS — G809 Cerebral palsy, unspecified: Secondary | ICD-10-CM | POA: Diagnosis not present

## 2017-04-16 DIAGNOSIS — G4089 Other seizures: Secondary | ICD-10-CM | POA: Diagnosis not present

## 2017-04-16 DIAGNOSIS — E559 Vitamin D deficiency, unspecified: Secondary | ICD-10-CM | POA: Diagnosis not present

## 2017-05-07 DIAGNOSIS — I10 Essential (primary) hypertension: Secondary | ICD-10-CM | POA: Diagnosis not present

## 2017-05-07 DIAGNOSIS — G4089 Other seizures: Secondary | ICD-10-CM | POA: Diagnosis not present

## 2017-05-07 DIAGNOSIS — G809 Cerebral palsy, unspecified: Secondary | ICD-10-CM | POA: Diagnosis not present

## 2017-05-07 DIAGNOSIS — E559 Vitamin D deficiency, unspecified: Secondary | ICD-10-CM | POA: Diagnosis not present

## 2017-05-07 DIAGNOSIS — E785 Hyperlipidemia, unspecified: Secondary | ICD-10-CM | POA: Diagnosis not present

## 2017-07-05 ENCOUNTER — Ambulatory Visit (HOSPITAL_COMMUNITY): Payer: Self-pay

## 2017-07-05 ENCOUNTER — Other Ambulatory Visit (HOSPITAL_COMMUNITY): Payer: Self-pay

## 2017-08-20 DIAGNOSIS — E559 Vitamin D deficiency, unspecified: Secondary | ICD-10-CM | POA: Diagnosis not present

## 2017-08-20 DIAGNOSIS — E785 Hyperlipidemia, unspecified: Secondary | ICD-10-CM | POA: Diagnosis not present

## 2017-08-22 DIAGNOSIS — E559 Vitamin D deficiency, unspecified: Secondary | ICD-10-CM | POA: Diagnosis not present

## 2017-08-22 DIAGNOSIS — I10 Essential (primary) hypertension: Secondary | ICD-10-CM | POA: Diagnosis not present

## 2017-08-22 DIAGNOSIS — E785 Hyperlipidemia, unspecified: Secondary | ICD-10-CM | POA: Diagnosis not present

## 2017-08-22 DIAGNOSIS — M6281 Muscle weakness (generalized): Secondary | ICD-10-CM | POA: Diagnosis not present

## 2017-08-22 DIAGNOSIS — R52 Pain, unspecified: Secondary | ICD-10-CM | POA: Diagnosis not present

## 2017-08-22 DIAGNOSIS — G4089 Other seizures: Secondary | ICD-10-CM | POA: Diagnosis not present

## 2017-08-22 DIAGNOSIS — G809 Cerebral palsy, unspecified: Secondary | ICD-10-CM | POA: Diagnosis not present

## 2017-08-22 DIAGNOSIS — Z7409 Other reduced mobility: Secondary | ICD-10-CM | POA: Diagnosis not present

## 2017-10-01 DIAGNOSIS — L03116 Cellulitis of left lower limb: Secondary | ICD-10-CM | POA: Diagnosis not present

## 2017-10-01 DIAGNOSIS — Z79899 Other long term (current) drug therapy: Secondary | ICD-10-CM | POA: Diagnosis not present

## 2017-10-01 DIAGNOSIS — I10 Essential (primary) hypertension: Secondary | ICD-10-CM | POA: Diagnosis not present

## 2017-10-01 DIAGNOSIS — Z23 Encounter for immunization: Secondary | ICD-10-CM | POA: Diagnosis not present

## 2017-10-01 DIAGNOSIS — E785 Hyperlipidemia, unspecified: Secondary | ICD-10-CM | POA: Diagnosis not present

## 2017-10-01 DIAGNOSIS — G809 Cerebral palsy, unspecified: Secondary | ICD-10-CM | POA: Diagnosis not present

## 2017-10-01 DIAGNOSIS — B351 Tinea unguium: Secondary | ICD-10-CM | POA: Diagnosis not present

## 2017-10-11 DIAGNOSIS — G809 Cerebral palsy, unspecified: Secondary | ICD-10-CM | POA: Diagnosis not present

## 2017-10-11 DIAGNOSIS — L03116 Cellulitis of left lower limb: Secondary | ICD-10-CM | POA: Diagnosis not present

## 2017-10-15 DIAGNOSIS — L03116 Cellulitis of left lower limb: Secondary | ICD-10-CM | POA: Diagnosis not present

## 2017-10-15 DIAGNOSIS — G809 Cerebral palsy, unspecified: Secondary | ICD-10-CM | POA: Diagnosis not present

## 2017-10-17 DIAGNOSIS — R52 Pain, unspecified: Secondary | ICD-10-CM | POA: Diagnosis not present

## 2017-10-17 DIAGNOSIS — R4789 Other speech disturbances: Secondary | ICD-10-CM | POA: Diagnosis not present

## 2017-10-17 DIAGNOSIS — G809 Cerebral palsy, unspecified: Secondary | ICD-10-CM | POA: Diagnosis not present

## 2017-10-17 DIAGNOSIS — I10 Essential (primary) hypertension: Secondary | ICD-10-CM | POA: Diagnosis not present

## 2017-10-17 DIAGNOSIS — G4089 Other seizures: Secondary | ICD-10-CM | POA: Diagnosis not present

## 2017-10-17 DIAGNOSIS — R600 Localized edema: Secondary | ICD-10-CM | POA: Diagnosis not present

## 2017-10-17 DIAGNOSIS — M6281 Muscle weakness (generalized): Secondary | ICD-10-CM | POA: Diagnosis not present

## 2017-10-17 DIAGNOSIS — B351 Tinea unguium: Secondary | ICD-10-CM | POA: Diagnosis not present

## 2017-10-17 DIAGNOSIS — Z0001 Encounter for general adult medical examination with abnormal findings: Secondary | ICD-10-CM | POA: Diagnosis not present

## 2017-10-17 DIAGNOSIS — F419 Anxiety disorder, unspecified: Secondary | ICD-10-CM | POA: Diagnosis not present

## 2017-10-17 DIAGNOSIS — E785 Hyperlipidemia, unspecified: Secondary | ICD-10-CM | POA: Diagnosis not present

## 2017-10-17 DIAGNOSIS — Z79899 Other long term (current) drug therapy: Secondary | ICD-10-CM | POA: Diagnosis not present

## 2017-10-18 DIAGNOSIS — G809 Cerebral palsy, unspecified: Secondary | ICD-10-CM | POA: Diagnosis not present

## 2017-10-18 DIAGNOSIS — L03116 Cellulitis of left lower limb: Secondary | ICD-10-CM | POA: Diagnosis not present

## 2017-10-22 DIAGNOSIS — L03116 Cellulitis of left lower limb: Secondary | ICD-10-CM | POA: Diagnosis not present

## 2017-10-22 DIAGNOSIS — G809 Cerebral palsy, unspecified: Secondary | ICD-10-CM | POA: Diagnosis not present

## 2017-10-24 DIAGNOSIS — L03116 Cellulitis of left lower limb: Secondary | ICD-10-CM | POA: Diagnosis not present

## 2017-10-24 DIAGNOSIS — G809 Cerebral palsy, unspecified: Secondary | ICD-10-CM | POA: Diagnosis not present

## 2017-10-29 DIAGNOSIS — L03116 Cellulitis of left lower limb: Secondary | ICD-10-CM | POA: Diagnosis not present

## 2017-10-29 DIAGNOSIS — G809 Cerebral palsy, unspecified: Secondary | ICD-10-CM | POA: Diagnosis not present

## 2017-11-01 DIAGNOSIS — L03116 Cellulitis of left lower limb: Secondary | ICD-10-CM | POA: Diagnosis not present

## 2017-11-01 DIAGNOSIS — G809 Cerebral palsy, unspecified: Secondary | ICD-10-CM | POA: Diagnosis not present

## 2017-11-12 DIAGNOSIS — E559 Vitamin D deficiency, unspecified: Secondary | ICD-10-CM | POA: Diagnosis not present

## 2017-11-12 DIAGNOSIS — I10 Essential (primary) hypertension: Secondary | ICD-10-CM | POA: Diagnosis not present

## 2017-11-12 DIAGNOSIS — E785 Hyperlipidemia, unspecified: Secondary | ICD-10-CM | POA: Diagnosis not present

## 2017-11-14 DIAGNOSIS — R062 Wheezing: Secondary | ICD-10-CM | POA: Diagnosis not present

## 2017-11-14 DIAGNOSIS — D649 Anemia, unspecified: Secondary | ICD-10-CM | POA: Diagnosis not present

## 2017-11-14 DIAGNOSIS — E785 Hyperlipidemia, unspecified: Secondary | ICD-10-CM | POA: Diagnosis not present

## 2017-11-14 DIAGNOSIS — G809 Cerebral palsy, unspecified: Secondary | ICD-10-CM | POA: Diagnosis not present

## 2017-11-14 DIAGNOSIS — I10 Essential (primary) hypertension: Secondary | ICD-10-CM | POA: Diagnosis not present

## 2017-11-14 DIAGNOSIS — R600 Localized edema: Secondary | ICD-10-CM | POA: Diagnosis not present

## 2017-11-14 DIAGNOSIS — Z79899 Other long term (current) drug therapy: Secondary | ICD-10-CM | POA: Diagnosis not present

## 2017-11-14 DIAGNOSIS — E559 Vitamin D deficiency, unspecified: Secondary | ICD-10-CM | POA: Diagnosis not present

## 2018-01-14 DIAGNOSIS — G894 Chronic pain syndrome: Secondary | ICD-10-CM | POA: Diagnosis not present

## 2018-01-14 DIAGNOSIS — G809 Cerebral palsy, unspecified: Secondary | ICD-10-CM | POA: Diagnosis not present

## 2018-01-14 DIAGNOSIS — G40909 Epilepsy, unspecified, not intractable, without status epilepticus: Secondary | ICD-10-CM | POA: Diagnosis not present

## 2018-01-14 DIAGNOSIS — Z79899 Other long term (current) drug therapy: Secondary | ICD-10-CM | POA: Diagnosis not present

## 2018-01-29 DIAGNOSIS — E7849 Other hyperlipidemia: Secondary | ICD-10-CM | POA: Diagnosis not present

## 2018-01-29 DIAGNOSIS — K210 Gastro-esophageal reflux disease with esophagitis: Secondary | ICD-10-CM | POA: Diagnosis not present

## 2018-01-29 DIAGNOSIS — F329 Major depressive disorder, single episode, unspecified: Secondary | ICD-10-CM | POA: Diagnosis not present

## 2018-01-29 DIAGNOSIS — I872 Venous insufficiency (chronic) (peripheral): Secondary | ICD-10-CM | POA: Diagnosis not present

## 2018-01-29 DIAGNOSIS — G809 Cerebral palsy, unspecified: Secondary | ICD-10-CM | POA: Diagnosis not present

## 2018-01-29 DIAGNOSIS — D631 Anemia in chronic kidney disease: Secondary | ICD-10-CM | POA: Diagnosis not present

## 2018-01-29 DIAGNOSIS — Z125 Encounter for screening for malignant neoplasm of prostate: Secondary | ICD-10-CM | POA: Diagnosis not present

## 2018-01-29 DIAGNOSIS — G894 Chronic pain syndrome: Secondary | ICD-10-CM | POA: Diagnosis not present

## 2018-01-29 DIAGNOSIS — Z131 Encounter for screening for diabetes mellitus: Secondary | ICD-10-CM | POA: Diagnosis not present

## 2018-02-03 DIAGNOSIS — E785 Hyperlipidemia, unspecified: Secondary | ICD-10-CM | POA: Diagnosis not present

## 2018-02-03 DIAGNOSIS — E860 Dehydration: Secondary | ICD-10-CM | POA: Diagnosis not present

## 2018-02-06 DIAGNOSIS — N189 Chronic kidney disease, unspecified: Secondary | ICD-10-CM | POA: Diagnosis not present

## 2018-02-06 DIAGNOSIS — L97311 Non-pressure chronic ulcer of right ankle limited to breakdown of skin: Secondary | ICD-10-CM | POA: Diagnosis not present

## 2018-02-06 DIAGNOSIS — G809 Cerebral palsy, unspecified: Secondary | ICD-10-CM | POA: Diagnosis not present

## 2018-02-06 DIAGNOSIS — I129 Hypertensive chronic kidney disease with stage 1 through stage 4 chronic kidney disease, or unspecified chronic kidney disease: Secondary | ICD-10-CM | POA: Diagnosis not present

## 2018-02-06 DIAGNOSIS — L97818 Non-pressure chronic ulcer of other part of right lower leg with other specified severity: Secondary | ICD-10-CM | POA: Diagnosis not present

## 2018-02-06 DIAGNOSIS — G894 Chronic pain syndrome: Secondary | ICD-10-CM | POA: Diagnosis not present

## 2018-02-06 DIAGNOSIS — E785 Hyperlipidemia, unspecified: Secondary | ICD-10-CM | POA: Diagnosis not present

## 2018-02-06 DIAGNOSIS — K219 Gastro-esophageal reflux disease without esophagitis: Secondary | ICD-10-CM | POA: Diagnosis not present

## 2018-02-06 DIAGNOSIS — S90511A Abrasion, right ankle, initial encounter: Secondary | ICD-10-CM | POA: Diagnosis not present

## 2018-02-06 DIAGNOSIS — G40909 Epilepsy, unspecified, not intractable, without status epilepticus: Secondary | ICD-10-CM | POA: Diagnosis not present

## 2018-02-06 DIAGNOSIS — F329 Major depressive disorder, single episode, unspecified: Secondary | ICD-10-CM | POA: Diagnosis not present

## 2018-02-06 DIAGNOSIS — Z79899 Other long term (current) drug therapy: Secondary | ICD-10-CM | POA: Diagnosis not present

## 2018-02-10 ENCOUNTER — Other Ambulatory Visit (HOSPITAL_COMMUNITY): Payer: Self-pay | Admitting: *Deleted

## 2018-02-10 DIAGNOSIS — D45 Polycythemia vera: Secondary | ICD-10-CM

## 2018-02-11 ENCOUNTER — Other Ambulatory Visit: Payer: Self-pay

## 2018-02-11 ENCOUNTER — Encounter (HOSPITAL_COMMUNITY): Payer: Self-pay | Admitting: Internal Medicine

## 2018-02-11 ENCOUNTER — Inpatient Hospital Stay (HOSPITAL_COMMUNITY): Payer: Medicare Other

## 2018-02-11 ENCOUNTER — Inpatient Hospital Stay (HOSPITAL_COMMUNITY): Payer: Medicare Other | Attending: Internal Medicine | Admitting: Internal Medicine

## 2018-02-11 VITALS — BP 126/87 | HR 103 | Resp 18

## 2018-02-11 DIAGNOSIS — Z79899 Other long term (current) drug therapy: Secondary | ICD-10-CM

## 2018-02-11 DIAGNOSIS — Z87891 Personal history of nicotine dependence: Secondary | ICD-10-CM | POA: Diagnosis not present

## 2018-02-11 DIAGNOSIS — D751 Secondary polycythemia: Secondary | ICD-10-CM

## 2018-02-11 DIAGNOSIS — D45 Polycythemia vera: Secondary | ICD-10-CM

## 2018-02-11 DIAGNOSIS — F419 Anxiety disorder, unspecified: Secondary | ICD-10-CM | POA: Diagnosis not present

## 2018-02-11 LAB — CBC WITH DIFFERENTIAL/PLATELET
BASOS ABS: 0 10*3/uL (ref 0.0–0.1)
BASOS PCT: 0 %
EOS ABS: 0.3 10*3/uL (ref 0.0–0.7)
Eosinophils Relative: 3 %
HCT: 48.5 % (ref 39.0–52.0)
HEMOGLOBIN: 14.8 g/dL (ref 13.0–17.0)
Lymphocytes Relative: 18 %
Lymphs Abs: 1.4 10*3/uL (ref 0.7–4.0)
MCH: 30.4 pg (ref 26.0–34.0)
MCHC: 30.5 g/dL (ref 30.0–36.0)
MCV: 99.6 fL (ref 78.0–100.0)
MONOS PCT: 8 %
Monocytes Absolute: 0.6 10*3/uL (ref 0.1–1.0)
NEUTROS PCT: 71 %
Neutro Abs: 5.5 10*3/uL (ref 1.7–7.7)
Platelets: 336 10*3/uL (ref 150–400)
RBC: 4.87 MIL/uL (ref 4.22–5.81)
RDW: 14.1 % (ref 11.5–15.5)
WBC: 7.7 10*3/uL (ref 4.0–10.5)

## 2018-02-11 LAB — BASIC METABOLIC PANEL
ANION GAP: 11 (ref 5–15)
BUN: 11 mg/dL (ref 6–20)
CHLORIDE: 101 mmol/L (ref 101–111)
CO2: 29 mmol/L (ref 22–32)
CREATININE: 0.48 mg/dL — AB (ref 0.61–1.24)
Calcium: 9.2 mg/dL (ref 8.9–10.3)
GFR calc non Af Amer: >60 mL/min (ref >60)
Glucose, Bld: 63 mg/dL — ABNORMAL LOW (ref 65–99)
Potassium: 3.5 mmol/L (ref 3.5–5.1)
SODIUM: 141 mmol/L (ref 135–145)

## 2018-02-11 NOTE — Patient Instructions (Signed)
[place] at [facility] Discharge Instructions  RECOMMENDATIONS MADE BY THE CONSULTANT AND ANY TEST RESULTS WILL BE SENT TO YOUR REFERRING PHYSICIAN.  You were seen today by Dr. Mathis Dad Higgs No need to follow up with [place] moving forward. Follow up with your primary care physician.  Thank you for choosing [place] at [facility] to provide your oncology and hematology care.  To afford each patient quality time with our provider, please arrive at least 15 minutes before your scheduled appointment time.    If you have a lab appointment with the [facility] please come in thru the  Main Entrance and check in at the main information desk  You need to re-schedule your appointment should you arrive 10 or more minutes late.  We strive to give you quality time with our providers, and arriving late affects you and other patients whose appointments are after yours.  Also, if you no show three or more times for appointments you may be dismissed from the clinic at the providers discretion.     Again, thank you for choosing [facility].  Our hope is that these requests will decrease the amount of time that you wait before being seen by our physicians.       _____________________________________________________________  Should you have questions after your visit to [facility], please contact our office at [phone] between the hours of 8:30 a.m. and 4:30 p.m.  Voicemails left after 4:30 p.m. will not be returned until the following business day.  For prescription refill requests, have your pharmacy contact our office.       Resources For Cancer Patients and their Caregivers ? American Cancer Society: Can assist with transportation, wigs, general needs, runs Look Good Feel Better.        360-349-9251 ? Cancer Care: Provides financial assistance, online support groups, medication/co-pay assistance.  1-800-813-HOPE  (430)693-5445) ? [place] Assists [place] cancer patients and their families through emotional , educational and financial support.  267-642-4616 ? [place] DSS Where to apply for food stamps, Medicaid and utility assistance. (319)216-2299 ? [facility]: Transportation to medical appointments. 929 120 8088 ? Social Security Administration: May apply for disability if have a Stage IV cancer. 219-508-5761 858-268-1430 ? [employer], Disability and Transit Services: Assists with nutrition, care and transit needs. [place] Support Programs: @10RELATIVEDAYS @ > Cancer Support Group  2nd Tuesday of the month 1pm-2pm, Journey Room  > Creative Journey  3rd Tuesday of the month 1130am-1pm, Journey Room  > Look Good Feel Better  1st Wednesday of the month 10am-12 noon, Journey Room (Call [place] to register [phone])

## 2018-02-12 LAB — IRON AND TIBC
Iron: 88 ug/dL (ref 45–182)
SATURATION RATIOS: 28 % (ref 17.9–39.5)
TIBC: 312 ug/dL (ref 250–450)
UIBC: 224 ug/dL

## 2018-02-12 LAB — FERRITIN: FERRITIN: 33 ng/mL (ref 24–336)

## 2018-02-14 DIAGNOSIS — N189 Chronic kidney disease, unspecified: Secondary | ICD-10-CM | POA: Diagnosis not present

## 2018-02-14 DIAGNOSIS — L89512 Pressure ulcer of right ankle, stage 2: Secondary | ICD-10-CM | POA: Diagnosis not present

## 2018-02-14 DIAGNOSIS — I129 Hypertensive chronic kidney disease with stage 1 through stage 4 chronic kidney disease, or unspecified chronic kidney disease: Secondary | ICD-10-CM | POA: Diagnosis not present

## 2018-02-14 DIAGNOSIS — G40909 Epilepsy, unspecified, not intractable, without status epilepticus: Secondary | ICD-10-CM | POA: Diagnosis not present

## 2018-02-14 DIAGNOSIS — G809 Cerebral palsy, unspecified: Secondary | ICD-10-CM | POA: Diagnosis not present

## 2018-02-17 NOTE — Progress Notes (Signed)
Diagnosis No diagnosis found.  Staging Cancer Staging No matching staging information was found for the patient.  Assessment and Plan:   1.  Polycythemia Initially, patient was noted to have a JAK2 V617F mutation negative, Exon 12 and 13 mutation negative, CALR mutation negative Polycythemia with an elevated EPO level.  Sleep study on 08/23/2015 is suggestive of hypoxia while sleeping.    Labs done 02/11/2017 are WNL with WBC 7.7 HB 14 and plts 336,000.  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  He is assigned to prn followup.    2.  Health maintenance.  Followup with PCP for ongoing health screenings as recommended.     THERAPY PLAN:  Ongoing observation.   INTERVAL HISTORY: Ian Hurley 52 y.o. male returns for followup of Initially, patient was noted to have a JAK2 V617F mutation negative, Exon 12 and 13 mutation negative, CALR mutation negative Polycythemia with an elevated EPO level.  Sleep study on 08/23/2015 is suggestive of hypoxia while sleeping.  However, he has now developed an intermittent, mild anemia which is being followed.  Current Status:  Pt is doing well and denies any complaints.   Problem List Patient Active Problem List   Diagnosis Date Noted  . Pressure ulcer [L89.90] 09/13/2016  . Sepsis ([place]) [A41.9] 09/12/2016  . Constipation [K59.00] 07/19/2015  . Polycythemia [D75.1] 05/17/2015  . Anemia [D64.9] 05/21/2014  . Seizure disorder ([place]) [X93.716] 05/21/2014  . Anxiety [F41.9] 05/21/2014  . Cerebral palsy ([place]) [G80.9] 05/21/2014  . Depression [F32.9] 05/21/2014    Past Medical History Past Medical History:  Diagnosis Date  . Anemia   . Anxiety   . 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 Cerebral palsy ([place])   . Depression   . Duodenitis MAY 2015   HB 6.6 MCV 63 FERRITIN 6  . Erosive esophagitis MAY 2015   HB 6.6 MCV 63 FERRITIN 6  . Polycythemia 05/17/2015  . Seizures [facility])     Past Surgical  History Past Surgical History:  Procedure Laterality Date  . COLONOSCOPY N/A 05/24/2014   RCV:ELFYBO rectal polyp otherwise normal  . ESOPHAGOGASTRODUODENOSCOPY N/A 05/23/2014   FBP:ZWCHE HH/mild non-erosive gastritis/small gastric polyp  . FLEXIBLE SIGMOIDOSCOPY N/A 05/23/2014   SLF: formed stool in colon and large amount of liquid stool    Family History History reviewed. No pertinent family history.   Social History  reports that he quit smoking about 4 years ago. His smoking use included cigarettes. he has never used smokeless tobacco. He reports that he does not drink alcohol or use drugs.  Medications  Current Outpatient Medications:  .  atorvastatin (LIPITOR) 20 MG tablet, , Disp: , Rfl:  .  carvedilol (COREG) 3.125 MG tablet, , Disp: , Rfl:  .  cyclobenzaprine (FLEXERIL) 10 MG tablet, Take 10 mg by mouth 3 (three) times daily., Disp: , Rfl:  .  docusate sodium (COLACE) 100 MG capsule, Take 100 mg by mouth every morning., Disp: , Rfl:  .  furosemide (LASIX) 20 MG tablet, , Disp: , Rfl:  .  furosemide (LASIX) 40 MG tablet, Take 0.5 tablets (20 mg total) by mouth daily. *May take one tablet (75m total) daily as needed for edema* In addition to daily 420mregimen, Disp: 30 tablet, Rfl: 0 .  liver oil-zinc oxide (BOUDREAUXS BUTT PASTE) 40 % ointment, Apply 1 application topically 2 (two) times daily as needed for irritation. , Disp: , Rfl:  .  LORazepam (ATIVAN)  1 MG tablet, Take 1 mg by mouth every 8 (eight) hours as needed for anxiety., Disp: , Rfl:  .  OXYGEN, Inhale 2 L into the lungs at bedtime., Disp: , Rfl:  .  pantoprazole (PROTONIX) 40 MG tablet, Take 1 tablet (40 mg total) by mouth 2 (two) times daily before a meal., Disp: 60 tablet, Rfl: 3 .  PHENObarbital (LUMINAL) 60 MG tablet, , Disp: , Rfl:  .  POLY-IRON 150 FORTE 150-25-1 MG-MCG-MG CAPS, TAKE 1 CAPSULE BY MOUTH ONCE DAILY., Disp: 30 each, Rfl: 11 .  potassium chloride SA (K-DUR,KLOR-CON) 20 MEQ tablet, Take 40 mEq by  mouth daily. , Disp: , Rfl:  .  QUEtiapine (SEROQUEL) 50 MG tablet, Take 50 mg by mouth 2 (two) times daily., Disp: , Rfl:  .  sertraline (ZOLOFT) 100 MG tablet, Take 100 mg by mouth daily., Disp: , Rfl:  .  traMADol (ULTRAM) 50 MG tablet, Take 50 mg by mouth 2 (two) times daily as needed for moderate pain. , Disp: , Rfl:   Allergies Patient has no known allergies.  Review of Systems Review of Systems - Oncology ROS as per HPI otherwise 12 point ROS is negative.   Physical Exam  Vitals Wt Readings from Last 3 Encounters:  09/13/16 151 lb 3.8 oz (68.6 kg)  08/17/15 160 lb (72.6 kg)  05/24/14 152 lb (68.9 kg)   Temp Readings from Last 3 Encounters:  12/28/16 98.3 F (36.8 C) (Oral)  09/27/16 98.1 F (36.7 C) (Oral)  09/17/16 97.4 F (36.3 C) (Oral)   BP Readings from Last 3 Encounters:  02/11/18 126/87  12/28/16 129/87  09/27/16 126/90   Pulse Readings from Last 3 Encounters:  02/11/18 (!) 103  12/28/16 (!) 101  09/27/16 (!) 130   Constitutional: Well-developed, well-nourished, and in no distress.   HENT:  Head: Normocephalic and atraumatic.  Mouth/Throat: No oropharyngeal exudate. Mucosa moist. Eyes: Pupils are equal, round, and reactive to light. Conjunctivae are normal. No scleral icterus.  Neck: Normal range of motion. Neck supple. No JVD present.  Cardiovascular: Normal rate, regular rhythm and normal heart sounds.  Exam reveals no gallop and no friction rub.   No murmur heard. Pulmonary/Chest: Effort normal and breath sounds normal. No respiratory distress. No wheezes.No rales.  Abdominal: Soft. Bowel sounds are normal. No distension. There is no tenderness. There is no guarding.  Musculoskeletal: No edema or tenderness.  Lymphadenopathy: No cervical, axillaryor supraclavicular adenopathy.  Neurological: Alert and oriented to person, place, and time. No cranial nerve deficit.  Skin: Skin is warm and dry. No rash noted. No erythema. No pallor.  Psychiatric:  Affect and judgment normal.   Labs Appointment on 02/11/2018  Component Date Value Ref Range Status  . WBC 02/11/2018 7.7  4.0 - 10.5 K/uL Final  . RBC 02/11/2018 4.87  4.22 - 5.81 MIL/uL Final  . Hemoglobin 02/11/2018 14.8  13.0 - 17.0 g/dL Final  . HCT 02/11/2018 48.5  39.0 - 52.0 % Final  . MCV 02/11/2018 99.6  78.0 - 100.0 fL Final  . MCH 02/11/2018 30.4  26.0 - 34.0 pg Final  . MCHC 02/11/2018 30.5  30.0 - 36.0 g/dL Final  . RDW 02/11/2018 14.1  11.5 - 15.5 % Final  . Platelets 02/11/2018 336  150 - 400 K/uL Final  . Neutrophils Relative % 02/11/2018 71  % Final  . Neutro Abs 02/11/2018 5.5  1.7 - 7.7 K/uL Final  . Lymphocytes Relative 02/11/2018 18  % Final  . Lymphs  Abs 02/11/2018 1.4  0.7 - 4.0 K/uL Final  . Monocytes Relative 02/11/2018 8  % Final  . Monocytes Absolute 02/11/2018 0.6  0.1 - 1.0 K/uL Final  . Eosinophils Relative 02/11/2018 3  % Final  . Eosinophils Absolute 02/11/2018 0.3  0.0 - 0.7 K/uL Final  . Basophils Relative 02/11/2018 0  % Final  . Basophils Absolute 02/11/2018 0.0  0.0 - 0.1 K/uL Final   Performed at [facility], [address], [place], [place] [address]  . Sodium 02/11/2018 141  135 - 145 mmol/L Final  . Potassium 02/11/2018 3.5  3.5 - 5.1 mmol/L Final  . Chloride 02/11/2018 101  101 - 111 mmol/L Final  . CO2 02/11/2018 29  22 - 32 mmol/L Final  . Glucose, Bld 02/11/2018 63* 65 - 99 mg/dL Final  . BUN 02/11/2018 11  6 - 20 mg/dL Final  . Creatinine, Ser 02/11/2018 0.48* 0.61 - 1.24 mg/dL Final  . Calcium 02/11/2018 9.2  8.9 - 10.3 mg/dL Final  . GFR calc non Af Amer 02/11/2018 >60  >60 mL/min Final  . GFR calc Af Amer 02/11/2018 >60  >60 mL/min Final   Comment: (NOTE) The eGFR has been calculated using the CKD EPI equation. This calculation has not been validated in all clinical situations. eGFR's persistently <60 mL/min signify possible Chronic Kidney Disease.   Georgiann Hahn gap 02/11/2018 11  5 - 15 Final   Performed at [facility],  [address], [place], [place] [address]  . Iron 02/11/2018 88  45 - 182 ug/dL Final  . TIBC 02/11/2018 312  250 - 450 ug/dL Final  . Saturation Ratios 02/11/2018 28  17.9 - 39.5 % Final  . UIBC 02/11/2018 224  ug/dL Final   Performed at [facility], [place] [address], [place], [place] [address]  . Ferritin 02/11/2018 33  24 - 336 ng/mL Final   Performed at [facility] [address] [place],  [address]     Pathology No orders of the defined types were placed in this encounter.      Zoila Shutter MD

## 2018-02-18 DIAGNOSIS — N189 Chronic kidney disease, unspecified: Secondary | ICD-10-CM | POA: Diagnosis not present

## 2018-02-18 DIAGNOSIS — L89512 Pressure ulcer of right ankle, stage 2: Secondary | ICD-10-CM | POA: Diagnosis not present

## 2018-02-18 DIAGNOSIS — I129 Hypertensive chronic kidney disease with stage 1 through stage 4 chronic kidney disease, or unspecified chronic kidney disease: Secondary | ICD-10-CM | POA: Diagnosis not present

## 2018-02-18 DIAGNOSIS — G809 Cerebral palsy, unspecified: Secondary | ICD-10-CM | POA: Diagnosis not present

## 2018-02-18 DIAGNOSIS — G40909 Epilepsy, unspecified, not intractable, without status epilepticus: Secondary | ICD-10-CM | POA: Diagnosis not present

## 2018-02-21 DIAGNOSIS — G40909 Epilepsy, unspecified, not intractable, without status epilepticus: Secondary | ICD-10-CM | POA: Diagnosis not present

## 2018-02-21 DIAGNOSIS — I129 Hypertensive chronic kidney disease with stage 1 through stage 4 chronic kidney disease, or unspecified chronic kidney disease: Secondary | ICD-10-CM | POA: Diagnosis not present

## 2018-02-21 DIAGNOSIS — L89512 Pressure ulcer of right ankle, stage 2: Secondary | ICD-10-CM | POA: Diagnosis not present

## 2018-02-21 DIAGNOSIS — G809 Cerebral palsy, unspecified: Secondary | ICD-10-CM | POA: Diagnosis not present

## 2018-02-21 DIAGNOSIS — N189 Chronic kidney disease, unspecified: Secondary | ICD-10-CM | POA: Diagnosis not present

## 2018-02-24 DIAGNOSIS — G809 Cerebral palsy, unspecified: Secondary | ICD-10-CM | POA: Diagnosis not present

## 2018-02-24 DIAGNOSIS — N189 Chronic kidney disease, unspecified: Secondary | ICD-10-CM | POA: Diagnosis not present

## 2018-02-24 DIAGNOSIS — I129 Hypertensive chronic kidney disease with stage 1 through stage 4 chronic kidney disease, or unspecified chronic kidney disease: Secondary | ICD-10-CM | POA: Diagnosis not present

## 2018-02-24 DIAGNOSIS — G40909 Epilepsy, unspecified, not intractable, without status epilepticus: Secondary | ICD-10-CM | POA: Diagnosis not present

## 2018-02-24 DIAGNOSIS — L89512 Pressure ulcer of right ankle, stage 2: Secondary | ICD-10-CM | POA: Diagnosis not present

## 2018-02-27 DIAGNOSIS — I129 Hypertensive chronic kidney disease with stage 1 through stage 4 chronic kidney disease, or unspecified chronic kidney disease: Secondary | ICD-10-CM | POA: Diagnosis not present

## 2018-02-27 DIAGNOSIS — N189 Chronic kidney disease, unspecified: Secondary | ICD-10-CM | POA: Diagnosis not present

## 2018-02-27 DIAGNOSIS — G809 Cerebral palsy, unspecified: Secondary | ICD-10-CM | POA: Diagnosis not present

## 2018-02-27 DIAGNOSIS — G40909 Epilepsy, unspecified, not intractable, without status epilepticus: Secondary | ICD-10-CM | POA: Diagnosis not present

## 2018-02-27 DIAGNOSIS — L89512 Pressure ulcer of right ankle, stage 2: Secondary | ICD-10-CM | POA: Diagnosis not present

## 2018-02-28 ENCOUNTER — Other Ambulatory Visit (HOSPITAL_COMMUNITY): Payer: Self-pay | Admitting: Oncology

## 2018-02-28 DIAGNOSIS — D509 Iron deficiency anemia, unspecified: Secondary | ICD-10-CM

## 2018-02-28 NOTE — Telephone Encounter (Signed)
Looks like Dr. Walden Field released Mr. Ian Hurley at his last visit.  His PCP should refill this medication if they feel it is appropriate. Please call patient and let him know this.   Mike Craze, NP [place] [phone]

## 2018-02-28 NOTE — Telephone Encounter (Signed)
Notified caregiver who verbalized understanding.

## 2018-03-04 DIAGNOSIS — G809 Cerebral palsy, unspecified: Secondary | ICD-10-CM | POA: Diagnosis not present

## 2018-03-04 DIAGNOSIS — I129 Hypertensive chronic kidney disease with stage 1 through stage 4 chronic kidney disease, or unspecified chronic kidney disease: Secondary | ICD-10-CM | POA: Diagnosis not present

## 2018-03-04 DIAGNOSIS — N189 Chronic kidney disease, unspecified: Secondary | ICD-10-CM | POA: Diagnosis not present

## 2018-03-04 DIAGNOSIS — L89512 Pressure ulcer of right ankle, stage 2: Secondary | ICD-10-CM | POA: Diagnosis not present

## 2018-03-04 DIAGNOSIS — G40909 Epilepsy, unspecified, not intractable, without status epilepticus: Secondary | ICD-10-CM | POA: Diagnosis not present

## 2018-03-06 DIAGNOSIS — L97311 Non-pressure chronic ulcer of right ankle limited to breakdown of skin: Secondary | ICD-10-CM | POA: Diagnosis not present

## 2018-03-06 DIAGNOSIS — L97518 Non-pressure chronic ulcer of other part of right foot with other specified severity: Secondary | ICD-10-CM | POA: Diagnosis not present

## 2018-03-06 DIAGNOSIS — S90511A Abrasion, right ankle, initial encounter: Secondary | ICD-10-CM | POA: Diagnosis not present

## 2018-03-07 DIAGNOSIS — N189 Chronic kidney disease, unspecified: Secondary | ICD-10-CM | POA: Diagnosis not present

## 2018-03-07 DIAGNOSIS — I129 Hypertensive chronic kidney disease with stage 1 through stage 4 chronic kidney disease, or unspecified chronic kidney disease: Secondary | ICD-10-CM | POA: Diagnosis not present

## 2018-03-07 DIAGNOSIS — G809 Cerebral palsy, unspecified: Secondary | ICD-10-CM | POA: Diagnosis not present

## 2018-03-07 DIAGNOSIS — L89512 Pressure ulcer of right ankle, stage 2: Secondary | ICD-10-CM | POA: Diagnosis not present

## 2018-03-07 DIAGNOSIS — G40909 Epilepsy, unspecified, not intractable, without status epilepticus: Secondary | ICD-10-CM | POA: Diagnosis not present

## 2018-03-10 DIAGNOSIS — I129 Hypertensive chronic kidney disease with stage 1 through stage 4 chronic kidney disease, or unspecified chronic kidney disease: Secondary | ICD-10-CM | POA: Diagnosis not present

## 2018-03-10 DIAGNOSIS — G809 Cerebral palsy, unspecified: Secondary | ICD-10-CM | POA: Diagnosis not present

## 2018-03-10 DIAGNOSIS — N189 Chronic kidney disease, unspecified: Secondary | ICD-10-CM | POA: Diagnosis not present

## 2018-03-10 DIAGNOSIS — L89512 Pressure ulcer of right ankle, stage 2: Secondary | ICD-10-CM | POA: Diagnosis not present

## 2018-03-10 DIAGNOSIS — G40909 Epilepsy, unspecified, not intractable, without status epilepticus: Secondary | ICD-10-CM | POA: Diagnosis not present

## 2018-03-14 DIAGNOSIS — L89512 Pressure ulcer of right ankle, stage 2: Secondary | ICD-10-CM | POA: Diagnosis not present

## 2018-03-14 DIAGNOSIS — G40909 Epilepsy, unspecified, not intractable, without status epilepticus: Secondary | ICD-10-CM | POA: Diagnosis not present

## 2018-03-14 DIAGNOSIS — N189 Chronic kidney disease, unspecified: Secondary | ICD-10-CM | POA: Diagnosis not present

## 2018-03-14 DIAGNOSIS — I129 Hypertensive chronic kidney disease with stage 1 through stage 4 chronic kidney disease, or unspecified chronic kidney disease: Secondary | ICD-10-CM | POA: Diagnosis not present

## 2018-03-14 DIAGNOSIS — G809 Cerebral palsy, unspecified: Secondary | ICD-10-CM | POA: Diagnosis not present

## 2018-03-17 DIAGNOSIS — L89512 Pressure ulcer of right ankle, stage 2: Secondary | ICD-10-CM | POA: Diagnosis not present

## 2018-03-17 DIAGNOSIS — N189 Chronic kidney disease, unspecified: Secondary | ICD-10-CM | POA: Diagnosis not present

## 2018-03-17 DIAGNOSIS — G40909 Epilepsy, unspecified, not intractable, without status epilepticus: Secondary | ICD-10-CM | POA: Diagnosis not present

## 2018-03-17 DIAGNOSIS — G809 Cerebral palsy, unspecified: Secondary | ICD-10-CM | POA: Diagnosis not present

## 2018-03-17 DIAGNOSIS — I129 Hypertensive chronic kidney disease with stage 1 through stage 4 chronic kidney disease, or unspecified chronic kidney disease: Secondary | ICD-10-CM | POA: Diagnosis not present

## 2018-03-21 DIAGNOSIS — G40909 Epilepsy, unspecified, not intractable, without status epilepticus: Secondary | ICD-10-CM | POA: Diagnosis not present

## 2018-03-21 DIAGNOSIS — N189 Chronic kidney disease, unspecified: Secondary | ICD-10-CM | POA: Diagnosis not present

## 2018-03-21 DIAGNOSIS — G809 Cerebral palsy, unspecified: Secondary | ICD-10-CM | POA: Diagnosis not present

## 2018-03-21 DIAGNOSIS — I129 Hypertensive chronic kidney disease with stage 1 through stage 4 chronic kidney disease, or unspecified chronic kidney disease: Secondary | ICD-10-CM | POA: Diagnosis not present

## 2018-03-21 DIAGNOSIS — L89512 Pressure ulcer of right ankle, stage 2: Secondary | ICD-10-CM | POA: Diagnosis not present

## 2018-03-24 DIAGNOSIS — N189 Chronic kidney disease, unspecified: Secondary | ICD-10-CM | POA: Diagnosis not present

## 2018-03-24 DIAGNOSIS — G809 Cerebral palsy, unspecified: Secondary | ICD-10-CM | POA: Diagnosis not present

## 2018-03-24 DIAGNOSIS — G40909 Epilepsy, unspecified, not intractable, without status epilepticus: Secondary | ICD-10-CM | POA: Diagnosis not present

## 2018-03-24 DIAGNOSIS — L89512 Pressure ulcer of right ankle, stage 2: Secondary | ICD-10-CM | POA: Diagnosis not present

## 2018-03-24 DIAGNOSIS — I129 Hypertensive chronic kidney disease with stage 1 through stage 4 chronic kidney disease, or unspecified chronic kidney disease: Secondary | ICD-10-CM | POA: Diagnosis not present

## 2018-03-27 DIAGNOSIS — L89512 Pressure ulcer of right ankle, stage 2: Secondary | ICD-10-CM | POA: Diagnosis not present

## 2018-03-27 DIAGNOSIS — G809 Cerebral palsy, unspecified: Secondary | ICD-10-CM | POA: Diagnosis not present

## 2018-03-27 DIAGNOSIS — G40909 Epilepsy, unspecified, not intractable, without status epilepticus: Secondary | ICD-10-CM | POA: Diagnosis not present

## 2018-03-27 DIAGNOSIS — N189 Chronic kidney disease, unspecified: Secondary | ICD-10-CM | POA: Diagnosis not present

## 2018-03-27 DIAGNOSIS — I129 Hypertensive chronic kidney disease with stage 1 through stage 4 chronic kidney disease, or unspecified chronic kidney disease: Secondary | ICD-10-CM | POA: Diagnosis not present

## 2018-03-28 ENCOUNTER — Other Ambulatory Visit (HOSPITAL_COMMUNITY): Payer: Self-pay | Admitting: Oncology

## 2018-03-28 DIAGNOSIS — D509 Iron deficiency anemia, unspecified: Secondary | ICD-10-CM

## 2018-03-28 NOTE — Telephone Encounter (Signed)
Please call pt and let him know that I'm declining to refill his iron supplement because he was released from follow-up here by Dr. Walden Field in 01/2018. His PCP should refill his medications, as needed.   Mike Craze, NP [place] [phone]

## 2018-03-28 NOTE — Telephone Encounter (Signed)
Notified Carin Primrose, administrator of nursing home where patient lives. He verbalized understanding.

## 2018-04-01 DIAGNOSIS — L89512 Pressure ulcer of right ankle, stage 2: Secondary | ICD-10-CM | POA: Diagnosis not present

## 2018-04-01 DIAGNOSIS — N189 Chronic kidney disease, unspecified: Secondary | ICD-10-CM | POA: Diagnosis not present

## 2018-04-01 DIAGNOSIS — G809 Cerebral palsy, unspecified: Secondary | ICD-10-CM | POA: Diagnosis not present

## 2018-04-01 DIAGNOSIS — I129 Hypertensive chronic kidney disease with stage 1 through stage 4 chronic kidney disease, or unspecified chronic kidney disease: Secondary | ICD-10-CM | POA: Diagnosis not present

## 2018-04-01 DIAGNOSIS — G40909 Epilepsy, unspecified, not intractable, without status epilepticus: Secondary | ICD-10-CM | POA: Diagnosis not present

## 2018-04-04 DIAGNOSIS — I129 Hypertensive chronic kidney disease with stage 1 through stage 4 chronic kidney disease, or unspecified chronic kidney disease: Secondary | ICD-10-CM | POA: Diagnosis not present

## 2018-04-04 DIAGNOSIS — L89512 Pressure ulcer of right ankle, stage 2: Secondary | ICD-10-CM | POA: Diagnosis not present

## 2018-04-04 DIAGNOSIS — G40909 Epilepsy, unspecified, not intractable, without status epilepticus: Secondary | ICD-10-CM | POA: Diagnosis not present

## 2018-04-04 DIAGNOSIS — G809 Cerebral palsy, unspecified: Secondary | ICD-10-CM | POA: Diagnosis not present

## 2018-04-04 DIAGNOSIS — N189 Chronic kidney disease, unspecified: Secondary | ICD-10-CM | POA: Diagnosis not present

## 2018-04-07 DIAGNOSIS — L89512 Pressure ulcer of right ankle, stage 2: Secondary | ICD-10-CM | POA: Diagnosis not present

## 2018-04-07 DIAGNOSIS — G809 Cerebral palsy, unspecified: Secondary | ICD-10-CM | POA: Diagnosis not present

## 2018-04-07 DIAGNOSIS — N189 Chronic kidney disease, unspecified: Secondary | ICD-10-CM | POA: Diagnosis not present

## 2018-04-07 DIAGNOSIS — G40909 Epilepsy, unspecified, not intractable, without status epilepticus: Secondary | ICD-10-CM | POA: Diagnosis not present

## 2018-04-07 DIAGNOSIS — I129 Hypertensive chronic kidney disease with stage 1 through stage 4 chronic kidney disease, or unspecified chronic kidney disease: Secondary | ICD-10-CM | POA: Diagnosis not present

## 2018-05-20 DIAGNOSIS — I872 Venous insufficiency (chronic) (peripheral): Secondary | ICD-10-CM | POA: Diagnosis not present

## 2018-05-20 DIAGNOSIS — D631 Anemia in chronic kidney disease: Secondary | ICD-10-CM | POA: Diagnosis not present

## 2018-05-20 DIAGNOSIS — F329 Major depressive disorder, single episode, unspecified: Secondary | ICD-10-CM | POA: Diagnosis not present

## 2018-05-20 DIAGNOSIS — E7849 Other hyperlipidemia: Secondary | ICD-10-CM | POA: Diagnosis not present

## 2018-05-20 DIAGNOSIS — K210 Gastro-esophageal reflux disease with esophagitis: Secondary | ICD-10-CM | POA: Diagnosis not present

## 2018-05-20 DIAGNOSIS — G894 Chronic pain syndrome: Secondary | ICD-10-CM | POA: Diagnosis not present

## 2018-05-20 DIAGNOSIS — Z0000 Encounter for general adult medical examination without abnormal findings: Secondary | ICD-10-CM | POA: Diagnosis not present

## 2018-05-20 DIAGNOSIS — G809 Cerebral palsy, unspecified: Secondary | ICD-10-CM | POA: Diagnosis not present

## 2018-08-28 DIAGNOSIS — I872 Venous insufficiency (chronic) (peripheral): Secondary | ICD-10-CM | POA: Diagnosis not present

## 2018-08-28 DIAGNOSIS — G809 Cerebral palsy, unspecified: Secondary | ICD-10-CM | POA: Diagnosis not present

## 2018-08-28 DIAGNOSIS — F329 Major depressive disorder, single episode, unspecified: Secondary | ICD-10-CM | POA: Diagnosis not present

## 2018-08-28 DIAGNOSIS — E7849 Other hyperlipidemia: Secondary | ICD-10-CM | POA: Diagnosis not present

## 2018-08-28 DIAGNOSIS — G894 Chronic pain syndrome: Secondary | ICD-10-CM | POA: Diagnosis not present

## 2018-08-28 DIAGNOSIS — D631 Anemia in chronic kidney disease: Secondary | ICD-10-CM | POA: Diagnosis not present

## 2018-08-28 DIAGNOSIS — K210 Gastro-esophageal reflux disease with esophagitis: Secondary | ICD-10-CM | POA: Diagnosis not present

## 2018-12-05 DIAGNOSIS — F329 Major depressive disorder, single episode, unspecified: Secondary | ICD-10-CM | POA: Diagnosis not present

## 2018-12-05 DIAGNOSIS — G809 Cerebral palsy, unspecified: Secondary | ICD-10-CM | POA: Diagnosis not present

## 2018-12-05 DIAGNOSIS — K210 Gastro-esophageal reflux disease with esophagitis: Secondary | ICD-10-CM | POA: Diagnosis not present

## 2018-12-05 DIAGNOSIS — I872 Venous insufficiency (chronic) (peripheral): Secondary | ICD-10-CM | POA: Diagnosis not present

## 2018-12-05 DIAGNOSIS — E7849 Other hyperlipidemia: Secondary | ICD-10-CM | POA: Diagnosis not present

## 2018-12-05 DIAGNOSIS — G894 Chronic pain syndrome: Secondary | ICD-10-CM | POA: Diagnosis not present

## 2018-12-05 DIAGNOSIS — D631 Anemia in chronic kidney disease: Secondary | ICD-10-CM | POA: Diagnosis not present

## 2019-02-18 DIAGNOSIS — H35033 Hypertensive retinopathy, bilateral: Secondary | ICD-10-CM | POA: Diagnosis not present

## 2019-02-18 DIAGNOSIS — H18413 Arcus senilis, bilateral: Secondary | ICD-10-CM | POA: Diagnosis not present

## 2019-02-18 DIAGNOSIS — H43813 Vitreous degeneration, bilateral: Secondary | ICD-10-CM | POA: Diagnosis not present

## 2019-02-18 DIAGNOSIS — H2513 Age-related nuclear cataract, bilateral: Secondary | ICD-10-CM | POA: Diagnosis not present

## 2019-03-05 DIAGNOSIS — I872 Venous insufficiency (chronic) (peripheral): Secondary | ICD-10-CM | POA: Diagnosis not present

## 2019-03-05 DIAGNOSIS — G894 Chronic pain syndrome: Secondary | ICD-10-CM | POA: Diagnosis not present

## 2019-03-05 DIAGNOSIS — E7849 Other hyperlipidemia: Secondary | ICD-10-CM | POA: Diagnosis not present

## 2019-03-05 DIAGNOSIS — G809 Cerebral palsy, unspecified: Secondary | ICD-10-CM | POA: Diagnosis not present

## 2019-03-05 DIAGNOSIS — K210 Gastro-esophageal reflux disease with esophagitis: Secondary | ICD-10-CM | POA: Diagnosis not present

## 2019-03-05 DIAGNOSIS — D631 Anemia in chronic kidney disease: Secondary | ICD-10-CM | POA: Diagnosis not present

## 2019-03-05 DIAGNOSIS — F329 Major depressive disorder, single episode, unspecified: Secondary | ICD-10-CM | POA: Diagnosis not present

## 2019-03-05 DIAGNOSIS — I10 Essential (primary) hypertension: Secondary | ICD-10-CM | POA: Diagnosis not present

## 2019-03-09 DIAGNOSIS — L84 Corns and callosities: Secondary | ICD-10-CM | POA: Diagnosis not present

## 2019-03-09 DIAGNOSIS — M2041 Other hammer toe(s) (acquired), right foot: Secondary | ICD-10-CM | POA: Diagnosis not present

## 2019-03-09 DIAGNOSIS — M79674 Pain in right toe(s): Secondary | ICD-10-CM | POA: Diagnosis not present

## 2019-03-09 DIAGNOSIS — B351 Tinea unguium: Secondary | ICD-10-CM | POA: Diagnosis not present

## 2019-03-09 DIAGNOSIS — I739 Peripheral vascular disease, unspecified: Secondary | ICD-10-CM | POA: Diagnosis not present

## 2019-04-06 DIAGNOSIS — G809 Cerebral palsy, unspecified: Secondary | ICD-10-CM | POA: Diagnosis not present

## 2019-04-06 DIAGNOSIS — G40909 Epilepsy, unspecified, not intractable, without status epilepticus: Secondary | ICD-10-CM | POA: Diagnosis not present

## 2019-04-06 DIAGNOSIS — G894 Chronic pain syndrome: Secondary | ICD-10-CM | POA: Diagnosis not present

## 2019-04-06 DIAGNOSIS — Z79899 Other long term (current) drug therapy: Secondary | ICD-10-CM | POA: Diagnosis not present

## 2019-04-28 NOTE — Progress Notes (Signed)
REVIEWED-NO ADDITIONAL RECOMMENDATIONS. 

## 2019-09-30 DIAGNOSIS — G894 Chronic pain syndrome: Secondary | ICD-10-CM | POA: Diagnosis not present

## 2019-09-30 DIAGNOSIS — G809 Cerebral palsy, unspecified: Secondary | ICD-10-CM | POA: Diagnosis not present

## 2019-09-30 DIAGNOSIS — Z79899 Other long term (current) drug therapy: Secondary | ICD-10-CM | POA: Diagnosis not present

## 2019-09-30 DIAGNOSIS — G40909 Epilepsy, unspecified, not intractable, without status epilepticus: Secondary | ICD-10-CM | POA: Diagnosis not present

## 2019-12-03 DIAGNOSIS — I10 Essential (primary) hypertension: Secondary | ICD-10-CM | POA: Diagnosis not present

## 2019-12-03 DIAGNOSIS — G894 Chronic pain syndrome: Secondary | ICD-10-CM | POA: Diagnosis not present

## 2019-12-03 DIAGNOSIS — D631 Anemia in chronic kidney disease: Secondary | ICD-10-CM | POA: Diagnosis not present

## 2019-12-03 DIAGNOSIS — K219 Gastro-esophageal reflux disease without esophagitis: Secondary | ICD-10-CM | POA: Diagnosis not present

## 2019-12-03 DIAGNOSIS — G809 Cerebral palsy, unspecified: Secondary | ICD-10-CM | POA: Diagnosis not present

## 2019-12-03 DIAGNOSIS — E7849 Other hyperlipidemia: Secondary | ICD-10-CM | POA: Diagnosis not present

## 2019-12-03 DIAGNOSIS — I872 Venous insufficiency (chronic) (peripheral): Secondary | ICD-10-CM | POA: Diagnosis not present

## 2019-12-03 DIAGNOSIS — F329 Major depressive disorder, single episode, unspecified: Secondary | ICD-10-CM | POA: Diagnosis not present

## 2019-12-04 DIAGNOSIS — I872 Venous insufficiency (chronic) (peripheral): Secondary | ICD-10-CM | POA: Diagnosis not present

## 2019-12-04 DIAGNOSIS — D631 Anemia in chronic kidney disease: Secondary | ICD-10-CM | POA: Diagnosis not present

## 2019-12-04 DIAGNOSIS — E7849 Other hyperlipidemia: Secondary | ICD-10-CM | POA: Diagnosis not present

## 2019-12-04 DIAGNOSIS — I10 Essential (primary) hypertension: Secondary | ICD-10-CM | POA: Diagnosis not present

## 2019-12-04 DIAGNOSIS — G809 Cerebral palsy, unspecified: Secondary | ICD-10-CM | POA: Diagnosis not present

## 2020-03-07 ENCOUNTER — Ambulatory Visit: Payer: Medicare Other | Attending: Internal Medicine

## 2020-03-07 ENCOUNTER — Other Ambulatory Visit: Payer: Self-pay

## 2020-03-07 DIAGNOSIS — Z20822 Contact with and (suspected) exposure to covid-19: Secondary | ICD-10-CM

## 2020-03-08 ENCOUNTER — Telehealth: Payer: Self-pay

## 2020-03-08 LAB — NOVEL CORONAVIRUS, NAA: SARS-CoV-2, NAA: NOT DETECTED

## 2020-03-08 NOTE — Telephone Encounter (Signed)
Spoke with Elberta Fortis, [employer] of [facility] [place]", re: COVID test results being negative; the virus was not detected.  Verb. Understanding.

## 2020-03-14 DIAGNOSIS — U071 COVID-19: Secondary | ICD-10-CM | POA: Diagnosis not present

## 2020-05-05 DIAGNOSIS — R000 Tachycardia, unspecified: Secondary | ICD-10-CM | POA: Diagnosis not present

## 2020-05-05 DIAGNOSIS — L03113 Cellulitis of right upper limb: Secondary | ICD-10-CM | POA: Diagnosis not present

## 2020-05-05 DIAGNOSIS — I10 Essential (primary) hypertension: Secondary | ICD-10-CM | POA: Diagnosis not present

## 2020-05-05 DIAGNOSIS — R5381 Other malaise: Secondary | ICD-10-CM | POA: Diagnosis not present

## 2020-05-05 DIAGNOSIS — O331 Maternal care for disproportion due to generally contracted pelvis: Secondary | ICD-10-CM | POA: Diagnosis not present

## 2020-05-05 DIAGNOSIS — R296 Repeated falls: Secondary | ICD-10-CM | POA: Diagnosis not present

## 2020-05-05 DIAGNOSIS — E7800 Pure hypercholesterolemia, unspecified: Secondary | ICD-10-CM | POA: Diagnosis not present

## 2020-05-05 DIAGNOSIS — G809 Cerebral palsy, unspecified: Secondary | ICD-10-CM | POA: Diagnosis not present

## 2020-05-10 DIAGNOSIS — G40909 Epilepsy, unspecified, not intractable, without status epilepticus: Secondary | ICD-10-CM | POA: Diagnosis not present

## 2020-05-10 DIAGNOSIS — G894 Chronic pain syndrome: Secondary | ICD-10-CM | POA: Diagnosis not present

## 2020-05-10 DIAGNOSIS — G809 Cerebral palsy, unspecified: Secondary | ICD-10-CM | POA: Diagnosis not present

## 2020-05-12 DIAGNOSIS — Z1331 Encounter for screening for depression: Secondary | ICD-10-CM | POA: Diagnosis not present

## 2020-05-12 DIAGNOSIS — Z125 Encounter for screening for malignant neoplasm of prostate: Secondary | ICD-10-CM | POA: Diagnosis not present

## 2020-05-12 DIAGNOSIS — G894 Chronic pain syndrome: Secondary | ICD-10-CM | POA: Diagnosis not present

## 2020-05-12 DIAGNOSIS — F329 Major depressive disorder, single episode, unspecified: Secondary | ICD-10-CM | POA: Diagnosis not present

## 2020-05-12 DIAGNOSIS — Z0000 Encounter for general adult medical examination without abnormal findings: Secondary | ICD-10-CM | POA: Diagnosis not present

## 2020-05-12 DIAGNOSIS — D631 Anemia in chronic kidney disease: Secondary | ICD-10-CM | POA: Diagnosis not present

## 2020-05-12 DIAGNOSIS — G809 Cerebral palsy, unspecified: Secondary | ICD-10-CM | POA: Diagnosis not present

## 2020-05-12 DIAGNOSIS — I10 Essential (primary) hypertension: Secondary | ICD-10-CM | POA: Diagnosis not present

## 2020-05-12 DIAGNOSIS — E7849 Other hyperlipidemia: Secondary | ICD-10-CM | POA: Diagnosis not present

## 2020-05-12 DIAGNOSIS — K2100 Gastro-esophageal reflux disease with esophagitis, without bleeding: Secondary | ICD-10-CM | POA: Diagnosis not present

## 2020-05-12 DIAGNOSIS — I872 Venous insufficiency (chronic) (peripheral): Secondary | ICD-10-CM | POA: Diagnosis not present

## 2020-08-07 DIAGNOSIS — Z7401 Bed confinement status: Secondary | ICD-10-CM | POA: Diagnosis not present

## 2020-08-07 DIAGNOSIS — E7800 Pure hypercholesterolemia, unspecified: Secondary | ICD-10-CM | POA: Diagnosis present

## 2020-08-07 DIAGNOSIS — A419 Sepsis, unspecified organism: Secondary | ICD-10-CM | POA: Diagnosis present

## 2020-08-07 DIAGNOSIS — R2242 Localized swelling, mass and lump, left lower limb: Secondary | ICD-10-CM | POA: Diagnosis not present

## 2020-08-07 DIAGNOSIS — K219 Gastro-esophageal reflux disease without esophagitis: Secondary | ICD-10-CM | POA: Diagnosis present

## 2020-08-07 DIAGNOSIS — R5381 Other malaise: Secondary | ICD-10-CM | POA: Diagnosis not present

## 2020-08-07 DIAGNOSIS — I10 Essential (primary) hypertension: Secondary | ICD-10-CM | POA: Diagnosis present

## 2020-08-07 DIAGNOSIS — L89893 Pressure ulcer of other site, stage 3: Secondary | ICD-10-CM | POA: Diagnosis present

## 2020-08-07 DIAGNOSIS — L89012 Pressure ulcer of right elbow, stage 2: Secondary | ICD-10-CM | POA: Diagnosis present

## 2020-08-07 DIAGNOSIS — Z79899 Other long term (current) drug therapy: Secondary | ICD-10-CM | POA: Diagnosis not present

## 2020-08-07 DIAGNOSIS — R2240 Localized swelling, mass and lump, unspecified lower limb: Secondary | ICD-10-CM | POA: Diagnosis not present

## 2020-08-07 DIAGNOSIS — E876 Hypokalemia: Secondary | ICD-10-CM | POA: Diagnosis present

## 2020-08-07 DIAGNOSIS — D509 Iron deficiency anemia, unspecified: Secondary | ICD-10-CM | POA: Diagnosis present

## 2020-08-07 DIAGNOSIS — R609 Edema, unspecified: Secondary | ICD-10-CM | POA: Diagnosis not present

## 2020-08-07 DIAGNOSIS — L03119 Cellulitis of unspecified part of limb: Secondary | ICD-10-CM | POA: Diagnosis not present

## 2020-08-07 DIAGNOSIS — Z87891 Personal history of nicotine dependence: Secondary | ICD-10-CM | POA: Diagnosis not present

## 2020-08-07 DIAGNOSIS — R532 Functional quadriplegia: Secondary | ICD-10-CM | POA: Diagnosis present

## 2020-08-07 DIAGNOSIS — G40909 Epilepsy, unspecified, not intractable, without status epilepticus: Secondary | ICD-10-CM | POA: Diagnosis present

## 2020-08-07 DIAGNOSIS — G809 Cerebral palsy, unspecified: Secondary | ICD-10-CM | POA: Diagnosis present

## 2020-08-07 DIAGNOSIS — Z20822 Contact with and (suspected) exposure to covid-19: Secondary | ICD-10-CM | POA: Diagnosis present

## 2020-08-07 DIAGNOSIS — L03116 Cellulitis of left lower limb: Secondary | ICD-10-CM | POA: Diagnosis not present

## 2020-08-18 DIAGNOSIS — F329 Major depressive disorder, single episode, unspecified: Secondary | ICD-10-CM | POA: Diagnosis not present

## 2020-08-18 DIAGNOSIS — I872 Venous insufficiency (chronic) (peripheral): Secondary | ICD-10-CM | POA: Diagnosis not present

## 2020-08-18 DIAGNOSIS — G894 Chronic pain syndrome: Secondary | ICD-10-CM | POA: Diagnosis not present

## 2020-08-18 DIAGNOSIS — K2101 Gastro-esophageal reflux disease with esophagitis, with bleeding: Secondary | ICD-10-CM | POA: Diagnosis not present

## 2020-08-18 DIAGNOSIS — D631 Anemia in chronic kidney disease: Secondary | ICD-10-CM | POA: Diagnosis not present

## 2020-08-18 DIAGNOSIS — I10 Essential (primary) hypertension: Secondary | ICD-10-CM | POA: Diagnosis not present

## 2020-08-18 DIAGNOSIS — E7849 Other hyperlipidemia: Secondary | ICD-10-CM | POA: Diagnosis not present

## 2020-08-18 DIAGNOSIS — G809 Cerebral palsy, unspecified: Secondary | ICD-10-CM | POA: Diagnosis not present

## 2020-10-20 DIAGNOSIS — Z23 Encounter for immunization: Secondary | ICD-10-CM | POA: Diagnosis not present

## 2020-11-08 DIAGNOSIS — K2100 Gastro-esophageal reflux disease with esophagitis, without bleeding: Secondary | ICD-10-CM | POA: Diagnosis not present

## 2020-11-08 DIAGNOSIS — F329 Major depressive disorder, single episode, unspecified: Secondary | ICD-10-CM | POA: Diagnosis not present

## 2020-11-08 DIAGNOSIS — G809 Cerebral palsy, unspecified: Secondary | ICD-10-CM | POA: Diagnosis not present

## 2020-11-08 DIAGNOSIS — D631 Anemia in chronic kidney disease: Secondary | ICD-10-CM | POA: Diagnosis not present

## 2020-11-08 DIAGNOSIS — I10 Essential (primary) hypertension: Secondary | ICD-10-CM | POA: Diagnosis not present

## 2020-11-08 DIAGNOSIS — G894 Chronic pain syndrome: Secondary | ICD-10-CM | POA: Diagnosis not present

## 2020-11-08 DIAGNOSIS — E7849 Other hyperlipidemia: Secondary | ICD-10-CM | POA: Diagnosis not present

## 2020-11-08 DIAGNOSIS — I872 Venous insufficiency (chronic) (peripheral): Secondary | ICD-10-CM | POA: Diagnosis not present

## 2020-11-08 DIAGNOSIS — L89623 Pressure ulcer of left heel, stage 3: Secondary | ICD-10-CM | POA: Diagnosis not present

## 2020-11-13 DIAGNOSIS — G894 Chronic pain syndrome: Secondary | ICD-10-CM | POA: Diagnosis not present

## 2020-11-13 DIAGNOSIS — Z87891 Personal history of nicotine dependence: Secondary | ICD-10-CM | POA: Diagnosis not present

## 2020-11-13 DIAGNOSIS — L89013 Pressure ulcer of right elbow, stage 3: Secondary | ICD-10-CM | POA: Diagnosis not present

## 2020-11-13 DIAGNOSIS — G809 Cerebral palsy, unspecified: Secondary | ICD-10-CM | POA: Diagnosis not present

## 2020-11-13 DIAGNOSIS — L89893 Pressure ulcer of other site, stage 3: Secondary | ICD-10-CM | POA: Diagnosis not present

## 2020-11-13 DIAGNOSIS — F419 Anxiety disorder, unspecified: Secondary | ICD-10-CM | POA: Diagnosis not present

## 2020-11-13 DIAGNOSIS — I10 Essential (primary) hypertension: Secondary | ICD-10-CM | POA: Diagnosis not present

## 2020-11-13 DIAGNOSIS — F32A Depression, unspecified: Secondary | ICD-10-CM | POA: Diagnosis not present

## 2020-11-13 DIAGNOSIS — E7849 Other hyperlipidemia: Secondary | ICD-10-CM | POA: Diagnosis not present

## 2020-11-13 DIAGNOSIS — K2100 Gastro-esophageal reflux disease with esophagitis, without bleeding: Secondary | ICD-10-CM | POA: Diagnosis not present

## 2020-11-13 DIAGNOSIS — G40909 Epilepsy, unspecified, not intractable, without status epilepticus: Secondary | ICD-10-CM | POA: Diagnosis not present

## 2020-11-13 DIAGNOSIS — I872 Venous insufficiency (chronic) (peripheral): Secondary | ICD-10-CM | POA: Diagnosis not present

## 2020-11-16 DIAGNOSIS — G894 Chronic pain syndrome: Secondary | ICD-10-CM | POA: Diagnosis not present

## 2020-11-16 DIAGNOSIS — G809 Cerebral palsy, unspecified: Secondary | ICD-10-CM | POA: Diagnosis not present

## 2020-11-16 DIAGNOSIS — G40909 Epilepsy, unspecified, not intractable, without status epilepticus: Secondary | ICD-10-CM | POA: Diagnosis not present

## 2020-11-17 DIAGNOSIS — I10 Essential (primary) hypertension: Secondary | ICD-10-CM | POA: Diagnosis not present

## 2020-11-17 DIAGNOSIS — L89013 Pressure ulcer of right elbow, stage 3: Secondary | ICD-10-CM | POA: Diagnosis not present

## 2020-11-17 DIAGNOSIS — L89893 Pressure ulcer of other site, stage 3: Secondary | ICD-10-CM | POA: Diagnosis not present

## 2020-11-17 DIAGNOSIS — I872 Venous insufficiency (chronic) (peripheral): Secondary | ICD-10-CM | POA: Diagnosis not present

## 2020-11-17 DIAGNOSIS — G894 Chronic pain syndrome: Secondary | ICD-10-CM | POA: Diagnosis not present

## 2020-11-17 DIAGNOSIS — G809 Cerebral palsy, unspecified: Secondary | ICD-10-CM | POA: Diagnosis not present

## 2020-11-23 DIAGNOSIS — L89013 Pressure ulcer of right elbow, stage 3: Secondary | ICD-10-CM | POA: Diagnosis not present

## 2020-11-23 DIAGNOSIS — I872 Venous insufficiency (chronic) (peripheral): Secondary | ICD-10-CM | POA: Diagnosis not present

## 2020-11-23 DIAGNOSIS — G809 Cerebral palsy, unspecified: Secondary | ICD-10-CM | POA: Diagnosis not present

## 2020-11-23 DIAGNOSIS — I10 Essential (primary) hypertension: Secondary | ICD-10-CM | POA: Diagnosis not present

## 2020-11-23 DIAGNOSIS — G894 Chronic pain syndrome: Secondary | ICD-10-CM | POA: Diagnosis not present

## 2020-11-23 DIAGNOSIS — L89893 Pressure ulcer of other site, stage 3: Secondary | ICD-10-CM | POA: Diagnosis not present

## 2020-11-26 DIAGNOSIS — G809 Cerebral palsy, unspecified: Secondary | ICD-10-CM | POA: Diagnosis not present

## 2020-11-26 DIAGNOSIS — I10 Essential (primary) hypertension: Secondary | ICD-10-CM | POA: Diagnosis not present

## 2020-11-26 DIAGNOSIS — G894 Chronic pain syndrome: Secondary | ICD-10-CM | POA: Diagnosis not present

## 2020-11-26 DIAGNOSIS — L89013 Pressure ulcer of right elbow, stage 3: Secondary | ICD-10-CM | POA: Diagnosis not present

## 2020-11-26 DIAGNOSIS — I872 Venous insufficiency (chronic) (peripheral): Secondary | ICD-10-CM | POA: Diagnosis not present

## 2020-11-26 DIAGNOSIS — L89893 Pressure ulcer of other site, stage 3: Secondary | ICD-10-CM | POA: Diagnosis not present

## 2020-11-29 DIAGNOSIS — I10 Essential (primary) hypertension: Secondary | ICD-10-CM | POA: Diagnosis not present

## 2020-11-29 DIAGNOSIS — I872 Venous insufficiency (chronic) (peripheral): Secondary | ICD-10-CM | POA: Diagnosis not present

## 2020-11-29 DIAGNOSIS — G894 Chronic pain syndrome: Secondary | ICD-10-CM | POA: Diagnosis not present

## 2020-11-29 DIAGNOSIS — L89013 Pressure ulcer of right elbow, stage 3: Secondary | ICD-10-CM | POA: Diagnosis not present

## 2020-11-29 DIAGNOSIS — L89893 Pressure ulcer of other site, stage 3: Secondary | ICD-10-CM | POA: Diagnosis not present

## 2020-11-29 DIAGNOSIS — G809 Cerebral palsy, unspecified: Secondary | ICD-10-CM | POA: Diagnosis not present

## 2020-12-02 DIAGNOSIS — G809 Cerebral palsy, unspecified: Secondary | ICD-10-CM | POA: Diagnosis not present

## 2020-12-02 DIAGNOSIS — G894 Chronic pain syndrome: Secondary | ICD-10-CM | POA: Diagnosis not present

## 2020-12-02 DIAGNOSIS — I10 Essential (primary) hypertension: Secondary | ICD-10-CM | POA: Diagnosis not present

## 2020-12-02 DIAGNOSIS — I872 Venous insufficiency (chronic) (peripheral): Secondary | ICD-10-CM | POA: Diagnosis not present

## 2020-12-02 DIAGNOSIS — L89893 Pressure ulcer of other site, stage 3: Secondary | ICD-10-CM | POA: Diagnosis not present

## 2020-12-02 DIAGNOSIS — L89013 Pressure ulcer of right elbow, stage 3: Secondary | ICD-10-CM | POA: Diagnosis not present

## 2020-12-06 DIAGNOSIS — G894 Chronic pain syndrome: Secondary | ICD-10-CM | POA: Diagnosis not present

## 2020-12-06 DIAGNOSIS — I10 Essential (primary) hypertension: Secondary | ICD-10-CM | POA: Diagnosis not present

## 2020-12-06 DIAGNOSIS — L89013 Pressure ulcer of right elbow, stage 3: Secondary | ICD-10-CM | POA: Diagnosis not present

## 2020-12-06 DIAGNOSIS — L89893 Pressure ulcer of other site, stage 3: Secondary | ICD-10-CM | POA: Diagnosis not present

## 2020-12-06 DIAGNOSIS — G809 Cerebral palsy, unspecified: Secondary | ICD-10-CM | POA: Diagnosis not present

## 2020-12-06 DIAGNOSIS — I872 Venous insufficiency (chronic) (peripheral): Secondary | ICD-10-CM | POA: Diagnosis not present

## 2020-12-11 DIAGNOSIS — I10 Essential (primary) hypertension: Secondary | ICD-10-CM | POA: Diagnosis not present

## 2020-12-11 DIAGNOSIS — L89013 Pressure ulcer of right elbow, stage 3: Secondary | ICD-10-CM | POA: Diagnosis not present

## 2020-12-11 DIAGNOSIS — G894 Chronic pain syndrome: Secondary | ICD-10-CM | POA: Diagnosis not present

## 2020-12-11 DIAGNOSIS — L89893 Pressure ulcer of other site, stage 3: Secondary | ICD-10-CM | POA: Diagnosis not present

## 2020-12-11 DIAGNOSIS — I872 Venous insufficiency (chronic) (peripheral): Secondary | ICD-10-CM | POA: Diagnosis not present

## 2020-12-11 DIAGNOSIS — G809 Cerebral palsy, unspecified: Secondary | ICD-10-CM | POA: Diagnosis not present

## 2020-12-13 DIAGNOSIS — G894 Chronic pain syndrome: Secondary | ICD-10-CM | POA: Diagnosis not present

## 2020-12-13 DIAGNOSIS — I10 Essential (primary) hypertension: Secondary | ICD-10-CM | POA: Diagnosis not present

## 2020-12-13 DIAGNOSIS — E7849 Other hyperlipidemia: Secondary | ICD-10-CM | POA: Diagnosis not present

## 2020-12-13 DIAGNOSIS — G809 Cerebral palsy, unspecified: Secondary | ICD-10-CM | POA: Diagnosis not present

## 2020-12-13 DIAGNOSIS — L89893 Pressure ulcer of other site, stage 3: Secondary | ICD-10-CM | POA: Diagnosis not present

## 2020-12-13 DIAGNOSIS — F32A Depression, unspecified: Secondary | ICD-10-CM | POA: Diagnosis not present

## 2020-12-13 DIAGNOSIS — F419 Anxiety disorder, unspecified: Secondary | ICD-10-CM | POA: Diagnosis not present

## 2020-12-13 DIAGNOSIS — Z87891 Personal history of nicotine dependence: Secondary | ICD-10-CM | POA: Diagnosis not present

## 2020-12-13 DIAGNOSIS — G40909 Epilepsy, unspecified, not intractable, without status epilepticus: Secondary | ICD-10-CM | POA: Diagnosis not present

## 2020-12-13 DIAGNOSIS — I872 Venous insufficiency (chronic) (peripheral): Secondary | ICD-10-CM | POA: Diagnosis not present

## 2020-12-13 DIAGNOSIS — L89013 Pressure ulcer of right elbow, stage 3: Secondary | ICD-10-CM | POA: Diagnosis not present

## 2020-12-13 DIAGNOSIS — K2100 Gastro-esophageal reflux disease with esophagitis, without bleeding: Secondary | ICD-10-CM | POA: Diagnosis not present

## 2020-12-15 DIAGNOSIS — L89013 Pressure ulcer of right elbow, stage 3: Secondary | ICD-10-CM | POA: Diagnosis not present

## 2020-12-15 DIAGNOSIS — G809 Cerebral palsy, unspecified: Secondary | ICD-10-CM | POA: Diagnosis not present

## 2020-12-15 DIAGNOSIS — I10 Essential (primary) hypertension: Secondary | ICD-10-CM | POA: Diagnosis not present

## 2020-12-15 DIAGNOSIS — I872 Venous insufficiency (chronic) (peripheral): Secondary | ICD-10-CM | POA: Diagnosis not present

## 2020-12-15 DIAGNOSIS — G894 Chronic pain syndrome: Secondary | ICD-10-CM | POA: Diagnosis not present

## 2020-12-15 DIAGNOSIS — L89893 Pressure ulcer of other site, stage 3: Secondary | ICD-10-CM | POA: Diagnosis not present

## 2020-12-20 DIAGNOSIS — L89013 Pressure ulcer of right elbow, stage 3: Secondary | ICD-10-CM | POA: Diagnosis not present

## 2020-12-20 DIAGNOSIS — G809 Cerebral palsy, unspecified: Secondary | ICD-10-CM | POA: Diagnosis not present

## 2020-12-20 DIAGNOSIS — I10 Essential (primary) hypertension: Secondary | ICD-10-CM | POA: Diagnosis not present

## 2020-12-20 DIAGNOSIS — I872 Venous insufficiency (chronic) (peripheral): Secondary | ICD-10-CM | POA: Diagnosis not present

## 2020-12-20 DIAGNOSIS — G894 Chronic pain syndrome: Secondary | ICD-10-CM | POA: Diagnosis not present

## 2020-12-20 DIAGNOSIS — L89893 Pressure ulcer of other site, stage 3: Secondary | ICD-10-CM | POA: Diagnosis not present

## 2020-12-23 DIAGNOSIS — G894 Chronic pain syndrome: Secondary | ICD-10-CM | POA: Diagnosis not present

## 2020-12-23 DIAGNOSIS — I10 Essential (primary) hypertension: Secondary | ICD-10-CM | POA: Diagnosis not present

## 2020-12-23 DIAGNOSIS — G809 Cerebral palsy, unspecified: Secondary | ICD-10-CM | POA: Diagnosis not present

## 2020-12-23 DIAGNOSIS — I872 Venous insufficiency (chronic) (peripheral): Secondary | ICD-10-CM | POA: Diagnosis not present

## 2020-12-23 DIAGNOSIS — L89893 Pressure ulcer of other site, stage 3: Secondary | ICD-10-CM | POA: Diagnosis not present

## 2020-12-23 DIAGNOSIS — L89013 Pressure ulcer of right elbow, stage 3: Secondary | ICD-10-CM | POA: Diagnosis not present

## 2020-12-27 DIAGNOSIS — I872 Venous insufficiency (chronic) (peripheral): Secondary | ICD-10-CM | POA: Diagnosis not present

## 2020-12-27 DIAGNOSIS — I10 Essential (primary) hypertension: Secondary | ICD-10-CM | POA: Diagnosis not present

## 2020-12-27 DIAGNOSIS — G894 Chronic pain syndrome: Secondary | ICD-10-CM | POA: Diagnosis not present

## 2020-12-27 DIAGNOSIS — L89893 Pressure ulcer of other site, stage 3: Secondary | ICD-10-CM | POA: Diagnosis not present

## 2020-12-27 DIAGNOSIS — G809 Cerebral palsy, unspecified: Secondary | ICD-10-CM | POA: Diagnosis not present

## 2020-12-27 DIAGNOSIS — L89013 Pressure ulcer of right elbow, stage 3: Secondary | ICD-10-CM | POA: Diagnosis not present

## 2020-12-30 DIAGNOSIS — G809 Cerebral palsy, unspecified: Secondary | ICD-10-CM | POA: Diagnosis not present

## 2020-12-30 DIAGNOSIS — I10 Essential (primary) hypertension: Secondary | ICD-10-CM | POA: Diagnosis not present

## 2020-12-30 DIAGNOSIS — G894 Chronic pain syndrome: Secondary | ICD-10-CM | POA: Diagnosis not present

## 2020-12-30 DIAGNOSIS — L89013 Pressure ulcer of right elbow, stage 3: Secondary | ICD-10-CM | POA: Diagnosis not present

## 2020-12-30 DIAGNOSIS — L89893 Pressure ulcer of other site, stage 3: Secondary | ICD-10-CM | POA: Diagnosis not present

## 2020-12-30 DIAGNOSIS — I872 Venous insufficiency (chronic) (peripheral): Secondary | ICD-10-CM | POA: Diagnosis not present

## 2021-01-02 DIAGNOSIS — L89013 Pressure ulcer of right elbow, stage 3: Secondary | ICD-10-CM | POA: Diagnosis not present

## 2021-01-02 DIAGNOSIS — G894 Chronic pain syndrome: Secondary | ICD-10-CM | POA: Diagnosis not present

## 2021-01-02 DIAGNOSIS — I10 Essential (primary) hypertension: Secondary | ICD-10-CM | POA: Diagnosis not present

## 2021-01-02 DIAGNOSIS — G809 Cerebral palsy, unspecified: Secondary | ICD-10-CM | POA: Diagnosis not present

## 2021-01-02 DIAGNOSIS — I872 Venous insufficiency (chronic) (peripheral): Secondary | ICD-10-CM | POA: Diagnosis not present

## 2021-01-02 DIAGNOSIS — L89893 Pressure ulcer of other site, stage 3: Secondary | ICD-10-CM | POA: Diagnosis not present

## 2021-01-07 DIAGNOSIS — I10 Essential (primary) hypertension: Secondary | ICD-10-CM | POA: Diagnosis not present

## 2021-01-07 DIAGNOSIS — G894 Chronic pain syndrome: Secondary | ICD-10-CM | POA: Diagnosis not present

## 2021-01-07 DIAGNOSIS — I872 Venous insufficiency (chronic) (peripheral): Secondary | ICD-10-CM | POA: Diagnosis not present

## 2021-01-07 DIAGNOSIS — L89013 Pressure ulcer of right elbow, stage 3: Secondary | ICD-10-CM | POA: Diagnosis not present

## 2021-01-07 DIAGNOSIS — G809 Cerebral palsy, unspecified: Secondary | ICD-10-CM | POA: Diagnosis not present

## 2021-01-07 DIAGNOSIS — L89893 Pressure ulcer of other site, stage 3: Secondary | ICD-10-CM | POA: Diagnosis not present

## 2021-01-09 DIAGNOSIS — L89013 Pressure ulcer of right elbow, stage 3: Secondary | ICD-10-CM | POA: Diagnosis not present

## 2021-01-09 DIAGNOSIS — I10 Essential (primary) hypertension: Secondary | ICD-10-CM | POA: Diagnosis not present

## 2021-01-09 DIAGNOSIS — L89893 Pressure ulcer of other site, stage 3: Secondary | ICD-10-CM | POA: Diagnosis not present

## 2021-01-09 DIAGNOSIS — I872 Venous insufficiency (chronic) (peripheral): Secondary | ICD-10-CM | POA: Diagnosis not present

## 2021-01-09 DIAGNOSIS — G894 Chronic pain syndrome: Secondary | ICD-10-CM | POA: Diagnosis not present

## 2021-01-09 DIAGNOSIS — G809 Cerebral palsy, unspecified: Secondary | ICD-10-CM | POA: Diagnosis not present

## 2021-01-12 DIAGNOSIS — F419 Anxiety disorder, unspecified: Secondary | ICD-10-CM | POA: Diagnosis not present

## 2021-01-12 DIAGNOSIS — E7849 Other hyperlipidemia: Secondary | ICD-10-CM | POA: Diagnosis not present

## 2021-01-12 DIAGNOSIS — G809 Cerebral palsy, unspecified: Secondary | ICD-10-CM | POA: Diagnosis not present

## 2021-01-12 DIAGNOSIS — G40909 Epilepsy, unspecified, not intractable, without status epilepticus: Secondary | ICD-10-CM | POA: Diagnosis not present

## 2021-01-12 DIAGNOSIS — G894 Chronic pain syndrome: Secondary | ICD-10-CM | POA: Diagnosis not present

## 2021-01-12 DIAGNOSIS — I872 Venous insufficiency (chronic) (peripheral): Secondary | ICD-10-CM | POA: Diagnosis not present

## 2021-01-12 DIAGNOSIS — I10 Essential (primary) hypertension: Secondary | ICD-10-CM | POA: Diagnosis not present

## 2021-01-12 DIAGNOSIS — L89013 Pressure ulcer of right elbow, stage 3: Secondary | ICD-10-CM | POA: Diagnosis not present

## 2021-01-12 DIAGNOSIS — Z87891 Personal history of nicotine dependence: Secondary | ICD-10-CM | POA: Diagnosis not present

## 2021-01-12 DIAGNOSIS — L89893 Pressure ulcer of other site, stage 3: Secondary | ICD-10-CM | POA: Diagnosis not present

## 2021-01-13 DIAGNOSIS — G809 Cerebral palsy, unspecified: Secondary | ICD-10-CM | POA: Diagnosis not present

## 2021-01-13 DIAGNOSIS — G894 Chronic pain syndrome: Secondary | ICD-10-CM | POA: Diagnosis not present

## 2021-01-13 DIAGNOSIS — I10 Essential (primary) hypertension: Secondary | ICD-10-CM | POA: Diagnosis not present

## 2021-01-13 DIAGNOSIS — L89013 Pressure ulcer of right elbow, stage 3: Secondary | ICD-10-CM | POA: Diagnosis not present

## 2021-01-13 DIAGNOSIS — I872 Venous insufficiency (chronic) (peripheral): Secondary | ICD-10-CM | POA: Diagnosis not present

## 2021-01-13 DIAGNOSIS — L89893 Pressure ulcer of other site, stage 3: Secondary | ICD-10-CM | POA: Diagnosis not present

## 2021-01-18 DIAGNOSIS — I10 Essential (primary) hypertension: Secondary | ICD-10-CM | POA: Diagnosis not present

## 2021-01-18 DIAGNOSIS — I872 Venous insufficiency (chronic) (peripheral): Secondary | ICD-10-CM | POA: Diagnosis not present

## 2021-01-18 DIAGNOSIS — G809 Cerebral palsy, unspecified: Secondary | ICD-10-CM | POA: Diagnosis not present

## 2021-01-18 DIAGNOSIS — L89013 Pressure ulcer of right elbow, stage 3: Secondary | ICD-10-CM | POA: Diagnosis not present

## 2021-01-18 DIAGNOSIS — G894 Chronic pain syndrome: Secondary | ICD-10-CM | POA: Diagnosis not present

## 2021-01-18 DIAGNOSIS — L89893 Pressure ulcer of other site, stage 3: Secondary | ICD-10-CM | POA: Diagnosis not present

## 2021-01-21 DIAGNOSIS — L89893 Pressure ulcer of other site, stage 3: Secondary | ICD-10-CM | POA: Diagnosis not present

## 2021-01-21 DIAGNOSIS — G809 Cerebral palsy, unspecified: Secondary | ICD-10-CM | POA: Diagnosis not present

## 2021-01-21 DIAGNOSIS — I872 Venous insufficiency (chronic) (peripheral): Secondary | ICD-10-CM | POA: Diagnosis not present

## 2021-01-21 DIAGNOSIS — G894 Chronic pain syndrome: Secondary | ICD-10-CM | POA: Diagnosis not present

## 2021-01-21 DIAGNOSIS — I10 Essential (primary) hypertension: Secondary | ICD-10-CM | POA: Diagnosis not present

## 2021-01-21 DIAGNOSIS — L89013 Pressure ulcer of right elbow, stage 3: Secondary | ICD-10-CM | POA: Diagnosis not present

## 2021-01-25 DIAGNOSIS — G809 Cerebral palsy, unspecified: Secondary | ICD-10-CM | POA: Diagnosis not present

## 2021-01-25 DIAGNOSIS — I10 Essential (primary) hypertension: Secondary | ICD-10-CM | POA: Diagnosis not present

## 2021-01-25 DIAGNOSIS — G894 Chronic pain syndrome: Secondary | ICD-10-CM | POA: Diagnosis not present

## 2021-01-25 DIAGNOSIS — I872 Venous insufficiency (chronic) (peripheral): Secondary | ICD-10-CM | POA: Diagnosis not present

## 2021-01-25 DIAGNOSIS — L89893 Pressure ulcer of other site, stage 3: Secondary | ICD-10-CM | POA: Diagnosis not present

## 2021-01-25 DIAGNOSIS — L89013 Pressure ulcer of right elbow, stage 3: Secondary | ICD-10-CM | POA: Diagnosis not present

## 2021-01-28 DIAGNOSIS — G809 Cerebral palsy, unspecified: Secondary | ICD-10-CM | POA: Diagnosis not present

## 2021-01-28 DIAGNOSIS — L89013 Pressure ulcer of right elbow, stage 3: Secondary | ICD-10-CM | POA: Diagnosis not present

## 2021-01-28 DIAGNOSIS — I10 Essential (primary) hypertension: Secondary | ICD-10-CM | POA: Diagnosis not present

## 2021-01-28 DIAGNOSIS — I872 Venous insufficiency (chronic) (peripheral): Secondary | ICD-10-CM | POA: Diagnosis not present

## 2021-01-28 DIAGNOSIS — L89893 Pressure ulcer of other site, stage 3: Secondary | ICD-10-CM | POA: Diagnosis not present

## 2021-01-28 DIAGNOSIS — G894 Chronic pain syndrome: Secondary | ICD-10-CM | POA: Diagnosis not present

## 2021-02-01 DIAGNOSIS — L89013 Pressure ulcer of right elbow, stage 3: Secondary | ICD-10-CM | POA: Diagnosis not present

## 2021-02-01 DIAGNOSIS — I872 Venous insufficiency (chronic) (peripheral): Secondary | ICD-10-CM | POA: Diagnosis not present

## 2021-02-01 DIAGNOSIS — G809 Cerebral palsy, unspecified: Secondary | ICD-10-CM | POA: Diagnosis not present

## 2021-02-01 DIAGNOSIS — I10 Essential (primary) hypertension: Secondary | ICD-10-CM | POA: Diagnosis not present

## 2021-02-01 DIAGNOSIS — G894 Chronic pain syndrome: Secondary | ICD-10-CM | POA: Diagnosis not present

## 2021-02-01 DIAGNOSIS — L89893 Pressure ulcer of other site, stage 3: Secondary | ICD-10-CM | POA: Diagnosis not present

## 2021-02-04 DIAGNOSIS — I872 Venous insufficiency (chronic) (peripheral): Secondary | ICD-10-CM | POA: Diagnosis not present

## 2021-02-04 DIAGNOSIS — L89013 Pressure ulcer of right elbow, stage 3: Secondary | ICD-10-CM | POA: Diagnosis not present

## 2021-02-04 DIAGNOSIS — I10 Essential (primary) hypertension: Secondary | ICD-10-CM | POA: Diagnosis not present

## 2021-02-04 DIAGNOSIS — L89893 Pressure ulcer of other site, stage 3: Secondary | ICD-10-CM | POA: Diagnosis not present

## 2021-02-04 DIAGNOSIS — G809 Cerebral palsy, unspecified: Secondary | ICD-10-CM | POA: Diagnosis not present

## 2021-02-04 DIAGNOSIS — G894 Chronic pain syndrome: Secondary | ICD-10-CM | POA: Diagnosis not present

## 2021-02-07 DIAGNOSIS — G809 Cerebral palsy, unspecified: Secondary | ICD-10-CM | POA: Diagnosis not present

## 2021-02-07 DIAGNOSIS — I10 Essential (primary) hypertension: Secondary | ICD-10-CM | POA: Diagnosis not present

## 2021-02-07 DIAGNOSIS — L89013 Pressure ulcer of right elbow, stage 3: Secondary | ICD-10-CM | POA: Diagnosis not present

## 2021-02-07 DIAGNOSIS — G894 Chronic pain syndrome: Secondary | ICD-10-CM | POA: Diagnosis not present

## 2021-02-07 DIAGNOSIS — L89893 Pressure ulcer of other site, stage 3: Secondary | ICD-10-CM | POA: Diagnosis not present

## 2021-02-07 DIAGNOSIS — I872 Venous insufficiency (chronic) (peripheral): Secondary | ICD-10-CM | POA: Diagnosis not present

## 2021-02-08 DIAGNOSIS — K2100 Gastro-esophageal reflux disease with esophagitis, without bleeding: Secondary | ICD-10-CM | POA: Diagnosis not present

## 2021-02-08 DIAGNOSIS — D631 Anemia in chronic kidney disease: Secondary | ICD-10-CM | POA: Diagnosis not present

## 2021-02-08 DIAGNOSIS — I10 Essential (primary) hypertension: Secondary | ICD-10-CM | POA: Diagnosis not present

## 2021-02-08 DIAGNOSIS — G894 Chronic pain syndrome: Secondary | ICD-10-CM | POA: Diagnosis not present

## 2021-02-08 DIAGNOSIS — E7849 Other hyperlipidemia: Secondary | ICD-10-CM | POA: Diagnosis not present

## 2021-02-08 DIAGNOSIS — L89623 Pressure ulcer of left heel, stage 3: Secondary | ICD-10-CM | POA: Diagnosis not present

## 2021-02-08 DIAGNOSIS — F329 Major depressive disorder, single episode, unspecified: Secondary | ICD-10-CM | POA: Diagnosis not present

## 2021-02-08 DIAGNOSIS — I872 Venous insufficiency (chronic) (peripheral): Secondary | ICD-10-CM | POA: Diagnosis not present

## 2021-02-08 DIAGNOSIS — G809 Cerebral palsy, unspecified: Secondary | ICD-10-CM | POA: Diagnosis not present

## 2021-02-10 DIAGNOSIS — I10 Essential (primary) hypertension: Secondary | ICD-10-CM | POA: Diagnosis not present

## 2021-02-10 DIAGNOSIS — G894 Chronic pain syndrome: Secondary | ICD-10-CM | POA: Diagnosis not present

## 2021-02-10 DIAGNOSIS — I872 Venous insufficiency (chronic) (peripheral): Secondary | ICD-10-CM | POA: Diagnosis not present

## 2021-02-10 DIAGNOSIS — G809 Cerebral palsy, unspecified: Secondary | ICD-10-CM | POA: Diagnosis not present

## 2021-02-10 DIAGNOSIS — L89893 Pressure ulcer of other site, stage 3: Secondary | ICD-10-CM | POA: Diagnosis not present

## 2021-02-10 DIAGNOSIS — L89013 Pressure ulcer of right elbow, stage 3: Secondary | ICD-10-CM | POA: Diagnosis not present

## 2021-02-11 DIAGNOSIS — Z87891 Personal history of nicotine dependence: Secondary | ICD-10-CM | POA: Diagnosis not present

## 2021-02-11 DIAGNOSIS — L89013 Pressure ulcer of right elbow, stage 3: Secondary | ICD-10-CM | POA: Diagnosis not present

## 2021-02-11 DIAGNOSIS — G40909 Epilepsy, unspecified, not intractable, without status epilepticus: Secondary | ICD-10-CM | POA: Diagnosis not present

## 2021-02-11 DIAGNOSIS — I10 Essential (primary) hypertension: Secondary | ICD-10-CM | POA: Diagnosis not present

## 2021-02-11 DIAGNOSIS — E7849 Other hyperlipidemia: Secondary | ICD-10-CM | POA: Diagnosis not present

## 2021-02-11 DIAGNOSIS — L89893 Pressure ulcer of other site, stage 3: Secondary | ICD-10-CM | POA: Diagnosis not present

## 2021-02-11 DIAGNOSIS — F419 Anxiety disorder, unspecified: Secondary | ICD-10-CM | POA: Diagnosis not present

## 2021-02-11 DIAGNOSIS — G894 Chronic pain syndrome: Secondary | ICD-10-CM | POA: Diagnosis not present

## 2021-02-11 DIAGNOSIS — I872 Venous insufficiency (chronic) (peripheral): Secondary | ICD-10-CM | POA: Diagnosis not present

## 2021-02-11 DIAGNOSIS — G809 Cerebral palsy, unspecified: Secondary | ICD-10-CM | POA: Diagnosis not present

## 2021-02-15 DIAGNOSIS — L89013 Pressure ulcer of right elbow, stage 3: Secondary | ICD-10-CM | POA: Diagnosis not present

## 2021-02-15 DIAGNOSIS — I872 Venous insufficiency (chronic) (peripheral): Secondary | ICD-10-CM | POA: Diagnosis not present

## 2021-02-15 DIAGNOSIS — L89893 Pressure ulcer of other site, stage 3: Secondary | ICD-10-CM | POA: Diagnosis not present

## 2021-02-15 DIAGNOSIS — I10 Essential (primary) hypertension: Secondary | ICD-10-CM | POA: Diagnosis not present

## 2021-02-15 DIAGNOSIS — G809 Cerebral palsy, unspecified: Secondary | ICD-10-CM | POA: Diagnosis not present

## 2021-02-15 DIAGNOSIS — G894 Chronic pain syndrome: Secondary | ICD-10-CM | POA: Diagnosis not present

## 2021-02-17 DIAGNOSIS — G809 Cerebral palsy, unspecified: Secondary | ICD-10-CM | POA: Diagnosis not present

## 2021-02-17 DIAGNOSIS — L89013 Pressure ulcer of right elbow, stage 3: Secondary | ICD-10-CM | POA: Diagnosis not present

## 2021-02-17 DIAGNOSIS — L89893 Pressure ulcer of other site, stage 3: Secondary | ICD-10-CM | POA: Diagnosis not present

## 2021-02-17 DIAGNOSIS — I872 Venous insufficiency (chronic) (peripheral): Secondary | ICD-10-CM | POA: Diagnosis not present

## 2021-02-17 DIAGNOSIS — G894 Chronic pain syndrome: Secondary | ICD-10-CM | POA: Diagnosis not present

## 2021-02-17 DIAGNOSIS — I10 Essential (primary) hypertension: Secondary | ICD-10-CM | POA: Diagnosis not present

## 2021-02-25 DIAGNOSIS — G894 Chronic pain syndrome: Secondary | ICD-10-CM | POA: Diagnosis not present

## 2021-02-25 DIAGNOSIS — I872 Venous insufficiency (chronic) (peripheral): Secondary | ICD-10-CM | POA: Diagnosis not present

## 2021-02-25 DIAGNOSIS — L89013 Pressure ulcer of right elbow, stage 3: Secondary | ICD-10-CM | POA: Diagnosis not present

## 2021-02-25 DIAGNOSIS — G809 Cerebral palsy, unspecified: Secondary | ICD-10-CM | POA: Diagnosis not present

## 2021-02-25 DIAGNOSIS — L89893 Pressure ulcer of other site, stage 3: Secondary | ICD-10-CM | POA: Diagnosis not present

## 2021-02-25 DIAGNOSIS — I10 Essential (primary) hypertension: Secondary | ICD-10-CM | POA: Diagnosis not present

## 2021-02-28 DIAGNOSIS — L89893 Pressure ulcer of other site, stage 3: Secondary | ICD-10-CM | POA: Diagnosis not present

## 2021-02-28 DIAGNOSIS — I10 Essential (primary) hypertension: Secondary | ICD-10-CM | POA: Diagnosis not present

## 2021-02-28 DIAGNOSIS — G809 Cerebral palsy, unspecified: Secondary | ICD-10-CM | POA: Diagnosis not present

## 2021-02-28 DIAGNOSIS — I872 Venous insufficiency (chronic) (peripheral): Secondary | ICD-10-CM | POA: Diagnosis not present

## 2021-02-28 DIAGNOSIS — L89013 Pressure ulcer of right elbow, stage 3: Secondary | ICD-10-CM | POA: Diagnosis not present

## 2021-02-28 DIAGNOSIS — G894 Chronic pain syndrome: Secondary | ICD-10-CM | POA: Diagnosis not present

## 2021-03-04 DIAGNOSIS — I10 Essential (primary) hypertension: Secondary | ICD-10-CM | POA: Diagnosis not present

## 2021-03-04 DIAGNOSIS — L89013 Pressure ulcer of right elbow, stage 3: Secondary | ICD-10-CM | POA: Diagnosis not present

## 2021-03-04 DIAGNOSIS — L89893 Pressure ulcer of other site, stage 3: Secondary | ICD-10-CM | POA: Diagnosis not present

## 2021-03-04 DIAGNOSIS — G894 Chronic pain syndrome: Secondary | ICD-10-CM | POA: Diagnosis not present

## 2021-03-04 DIAGNOSIS — I872 Venous insufficiency (chronic) (peripheral): Secondary | ICD-10-CM | POA: Diagnosis not present

## 2021-03-04 DIAGNOSIS — G809 Cerebral palsy, unspecified: Secondary | ICD-10-CM | POA: Diagnosis not present

## 2021-03-11 DIAGNOSIS — L89013 Pressure ulcer of right elbow, stage 3: Secondary | ICD-10-CM | POA: Diagnosis not present

## 2021-03-11 DIAGNOSIS — G894 Chronic pain syndrome: Secondary | ICD-10-CM | POA: Diagnosis not present

## 2021-03-11 DIAGNOSIS — I10 Essential (primary) hypertension: Secondary | ICD-10-CM | POA: Diagnosis not present

## 2021-03-11 DIAGNOSIS — L89893 Pressure ulcer of other site, stage 3: Secondary | ICD-10-CM | POA: Diagnosis not present

## 2021-03-11 DIAGNOSIS — G809 Cerebral palsy, unspecified: Secondary | ICD-10-CM | POA: Diagnosis not present

## 2021-03-11 DIAGNOSIS — I872 Venous insufficiency (chronic) (peripheral): Secondary | ICD-10-CM | POA: Diagnosis not present

## 2021-05-09 DIAGNOSIS — G809 Cerebral palsy, unspecified: Secondary | ICD-10-CM | POA: Diagnosis not present

## 2021-05-09 DIAGNOSIS — I10 Essential (primary) hypertension: Secondary | ICD-10-CM | POA: Diagnosis not present

## 2021-05-09 DIAGNOSIS — I872 Venous insufficiency (chronic) (peripheral): Secondary | ICD-10-CM | POA: Diagnosis not present

## 2021-05-09 DIAGNOSIS — K2100 Gastro-esophageal reflux disease with esophagitis, without bleeding: Secondary | ICD-10-CM | POA: Diagnosis not present

## 2021-05-09 DIAGNOSIS — F329 Major depressive disorder, single episode, unspecified: Secondary | ICD-10-CM | POA: Diagnosis not present

## 2021-05-09 DIAGNOSIS — L89623 Pressure ulcer of left heel, stage 3: Secondary | ICD-10-CM | POA: Diagnosis not present

## 2021-05-09 DIAGNOSIS — D631 Anemia in chronic kidney disease: Secondary | ICD-10-CM | POA: Diagnosis not present

## 2021-05-09 DIAGNOSIS — G894 Chronic pain syndrome: Secondary | ICD-10-CM | POA: Diagnosis not present

## 2021-05-09 DIAGNOSIS — E7849 Other hyperlipidemia: Secondary | ICD-10-CM | POA: Diagnosis not present

## 2021-08-29 DIAGNOSIS — D631 Anemia in chronic kidney disease: Secondary | ICD-10-CM | POA: Diagnosis not present

## 2021-08-29 DIAGNOSIS — L89623 Pressure ulcer of left heel, stage 3: Secondary | ICD-10-CM | POA: Diagnosis not present

## 2021-08-29 DIAGNOSIS — Z0000 Encounter for general adult medical examination without abnormal findings: Secondary | ICD-10-CM | POA: Diagnosis not present

## 2021-08-29 DIAGNOSIS — I10 Essential (primary) hypertension: Secondary | ICD-10-CM | POA: Diagnosis not present

## 2021-08-29 DIAGNOSIS — G894 Chronic pain syndrome: Secondary | ICD-10-CM | POA: Diagnosis not present

## 2021-08-29 DIAGNOSIS — F329 Major depressive disorder, single episode, unspecified: Secondary | ICD-10-CM | POA: Diagnosis not present

## 2021-08-29 DIAGNOSIS — Z1331 Encounter for screening for depression: Secondary | ICD-10-CM | POA: Diagnosis not present

## 2021-08-29 DIAGNOSIS — K2101 Gastro-esophageal reflux disease with esophagitis, with bleeding: Secondary | ICD-10-CM | POA: Diagnosis not present

## 2021-08-29 DIAGNOSIS — G809 Cerebral palsy, unspecified: Secondary | ICD-10-CM | POA: Diagnosis not present

## 2021-08-29 DIAGNOSIS — I872 Venous insufficiency (chronic) (peripheral): Secondary | ICD-10-CM | POA: Diagnosis not present

## 2021-08-29 DIAGNOSIS — E7849 Other hyperlipidemia: Secondary | ICD-10-CM | POA: Diagnosis not present

## 2021-10-02 DIAGNOSIS — Z23 Encounter for immunization: Secondary | ICD-10-CM | POA: Diagnosis not present

## 2021-11-29 DIAGNOSIS — G809 Cerebral palsy, unspecified: Secondary | ICD-10-CM | POA: Diagnosis not present

## 2021-11-29 DIAGNOSIS — I872 Venous insufficiency (chronic) (peripheral): Secondary | ICD-10-CM | POA: Diagnosis not present

## 2021-11-29 DIAGNOSIS — F329 Major depressive disorder, single episode, unspecified: Secondary | ICD-10-CM | POA: Diagnosis not present

## 2021-11-29 DIAGNOSIS — L89623 Pressure ulcer of left heel, stage 3: Secondary | ICD-10-CM | POA: Diagnosis not present

## 2021-11-29 DIAGNOSIS — G894 Chronic pain syndrome: Secondary | ICD-10-CM | POA: Diagnosis not present

## 2021-11-29 DIAGNOSIS — D631 Anemia in chronic kidney disease: Secondary | ICD-10-CM | POA: Diagnosis not present

## 2021-11-29 DIAGNOSIS — E7849 Other hyperlipidemia: Secondary | ICD-10-CM | POA: Diagnosis not present

## 2021-11-29 DIAGNOSIS — K2100 Gastro-esophageal reflux disease with esophagitis, without bleeding: Secondary | ICD-10-CM | POA: Diagnosis not present

## 2021-11-29 DIAGNOSIS — I10 Essential (primary) hypertension: Secondary | ICD-10-CM | POA: Diagnosis not present

## 2022-02-27 DIAGNOSIS — L89623 Pressure ulcer of left heel, stage 3: Secondary | ICD-10-CM | POA: Diagnosis not present

## 2022-02-27 DIAGNOSIS — E7849 Other hyperlipidemia: Secondary | ICD-10-CM | POA: Diagnosis not present

## 2022-02-27 DIAGNOSIS — G894 Chronic pain syndrome: Secondary | ICD-10-CM | POA: Diagnosis not present

## 2022-02-27 DIAGNOSIS — F329 Major depressive disorder, single episode, unspecified: Secondary | ICD-10-CM | POA: Diagnosis not present

## 2022-02-27 DIAGNOSIS — I10 Essential (primary) hypertension: Secondary | ICD-10-CM | POA: Diagnosis not present

## 2022-02-27 DIAGNOSIS — I872 Venous insufficiency (chronic) (peripheral): Secondary | ICD-10-CM | POA: Diagnosis not present

## 2022-02-27 DIAGNOSIS — K2100 Gastro-esophageal reflux disease with esophagitis, without bleeding: Secondary | ICD-10-CM | POA: Diagnosis not present

## 2022-02-27 DIAGNOSIS — D631 Anemia in chronic kidney disease: Secondary | ICD-10-CM | POA: Diagnosis not present

## 2022-02-27 DIAGNOSIS — G809 Cerebral palsy, unspecified: Secondary | ICD-10-CM | POA: Diagnosis not present

## 2022-05-30 DIAGNOSIS — F329 Major depressive disorder, single episode, unspecified: Secondary | ICD-10-CM | POA: Diagnosis not present

## 2022-05-30 DIAGNOSIS — G4089 Other seizures: Secondary | ICD-10-CM | POA: Diagnosis not present

## 2022-05-30 DIAGNOSIS — E7849 Other hyperlipidemia: Secondary | ICD-10-CM | POA: Diagnosis not present

## 2022-05-30 DIAGNOSIS — G809 Cerebral palsy, unspecified: Secondary | ICD-10-CM | POA: Diagnosis not present

## 2022-05-30 DIAGNOSIS — I10 Essential (primary) hypertension: Secondary | ICD-10-CM | POA: Diagnosis not present

## 2022-05-30 DIAGNOSIS — D649 Anemia, unspecified: Secondary | ICD-10-CM | POA: Diagnosis not present

## 2022-05-30 DIAGNOSIS — L89623 Pressure ulcer of left heel, stage 3: Secondary | ICD-10-CM | POA: Diagnosis not present

## 2022-05-30 DIAGNOSIS — G894 Chronic pain syndrome: Secondary | ICD-10-CM | POA: Diagnosis not present

## 2022-05-30 DIAGNOSIS — K2100 Gastro-esophageal reflux disease with esophagitis, without bleeding: Secondary | ICD-10-CM | POA: Diagnosis not present

## 2022-05-30 DIAGNOSIS — I872 Venous insufficiency (chronic) (peripheral): Secondary | ICD-10-CM | POA: Diagnosis not present

## 2022-09-20 DIAGNOSIS — G4089 Other seizures: Secondary | ICD-10-CM | POA: Diagnosis not present

## 2022-09-20 DIAGNOSIS — G809 Cerebral palsy, unspecified: Secondary | ICD-10-CM | POA: Diagnosis not present

## 2022-09-20 DIAGNOSIS — L89623 Pressure ulcer of left heel, stage 3: Secondary | ICD-10-CM | POA: Diagnosis not present

## 2022-09-20 DIAGNOSIS — Z1331 Encounter for screening for depression: Secondary | ICD-10-CM | POA: Diagnosis not present

## 2022-09-20 DIAGNOSIS — F329 Major depressive disorder, single episode, unspecified: Secondary | ICD-10-CM | POA: Diagnosis not present

## 2022-09-20 DIAGNOSIS — G894 Chronic pain syndrome: Secondary | ICD-10-CM | POA: Diagnosis not present

## 2022-09-20 DIAGNOSIS — Z0000 Encounter for general adult medical examination without abnormal findings: Secondary | ICD-10-CM | POA: Diagnosis not present

## 2022-09-20 DIAGNOSIS — D649 Anemia, unspecified: Secondary | ICD-10-CM | POA: Diagnosis not present

## 2022-09-20 DIAGNOSIS — E7849 Other hyperlipidemia: Secondary | ICD-10-CM | POA: Diagnosis not present

## 2022-09-20 DIAGNOSIS — I872 Venous insufficiency (chronic) (peripheral): Secondary | ICD-10-CM | POA: Diagnosis not present

## 2022-09-20 DIAGNOSIS — I10 Essential (primary) hypertension: Secondary | ICD-10-CM | POA: Diagnosis not present

## 2022-09-20 DIAGNOSIS — K2100 Gastro-esophageal reflux disease with esophagitis, without bleeding: Secondary | ICD-10-CM | POA: Diagnosis not present

## 2022-10-11 DIAGNOSIS — Z23 Encounter for immunization: Secondary | ICD-10-CM | POA: Diagnosis not present

## 2022-12-27 DIAGNOSIS — K2100 Gastro-esophageal reflux disease with esophagitis, without bleeding: Secondary | ICD-10-CM | POA: Diagnosis not present

## 2022-12-27 DIAGNOSIS — G4089 Other seizures: Secondary | ICD-10-CM | POA: Diagnosis not present

## 2022-12-27 DIAGNOSIS — E7849 Other hyperlipidemia: Secondary | ICD-10-CM | POA: Diagnosis not present

## 2022-12-27 DIAGNOSIS — I10 Essential (primary) hypertension: Secondary | ICD-10-CM | POA: Diagnosis not present

## 2022-12-27 DIAGNOSIS — L89623 Pressure ulcer of left heel, stage 3: Secondary | ICD-10-CM | POA: Diagnosis not present

## 2022-12-27 DIAGNOSIS — D649 Anemia, unspecified: Secondary | ICD-10-CM | POA: Diagnosis not present

## 2022-12-27 DIAGNOSIS — F329 Major depressive disorder, single episode, unspecified: Secondary | ICD-10-CM | POA: Diagnosis not present

## 2022-12-27 DIAGNOSIS — G894 Chronic pain syndrome: Secondary | ICD-10-CM | POA: Diagnosis not present

## 2022-12-27 DIAGNOSIS — I872 Venous insufficiency (chronic) (peripheral): Secondary | ICD-10-CM | POA: Diagnosis not present

## 2022-12-27 DIAGNOSIS — G809 Cerebral palsy, unspecified: Secondary | ICD-10-CM | POA: Diagnosis not present

## 2023-03-06 ENCOUNTER — Other Ambulatory Visit (HOSPITAL_COMMUNITY)
Admission: RE | Admit: 2023-03-06 | Discharge: 2023-03-06 | Disposition: A | Discharge status: Home / Self Care | Payer: Medicare Other | Source: Ambulatory Visit | Attending: Internal Medicine | Admitting: Internal Medicine

## 2023-03-06 DIAGNOSIS — Z79899 Other long term (current) drug therapy: Secondary | ICD-10-CM | POA: Insufficient documentation

## 2023-03-06 DIAGNOSIS — Z125 Encounter for screening for malignant neoplasm of prostate: Secondary | ICD-10-CM | POA: Insufficient documentation

## 2023-03-06 DIAGNOSIS — R7989 Other specified abnormal findings of blood chemistry: Secondary | ICD-10-CM | POA: Diagnosis not present

## 2023-03-06 DIAGNOSIS — E782 Mixed hyperlipidemia: Secondary | ICD-10-CM | POA: Diagnosis not present

## 2023-03-06 LAB — COMPREHENSIVE METABOLIC PANEL
ALT: 22 U/L (ref 0–44)
AST: 23 U/L (ref 15–41)
Albumin: 3.3 g/dL — ABNORMAL LOW (ref 3.5–5.0)
Alkaline Phosphatase: 81 U/L (ref 38–126)
Anion gap: 9 (ref 5–15)
BUN: 16 mg/dL (ref 6–20)
CO2: 28 mmol/L (ref 22–32)
Calcium: 8.8 mg/dL — ABNORMAL LOW (ref 8.9–10.3)
Chloride: 105 mmol/L (ref 98–111)
Creatinine, Ser: 0.47 mg/dL — ABNORMAL LOW (ref 0.61–1.24)
GFR, Estimated: >60 mL/min (ref >60)
Glucose, Bld: 100 mg/dL — ABNORMAL HIGH (ref 70–99)
Potassium: 3.4 mmol/L — ABNORMAL LOW (ref 3.5–5.1)
Sodium: 142 mmol/L (ref 135–145)
Total Bilirubin: 0.3 mg/dL (ref 0.3–1.2)
Total Protein: 8.2 g/dL — ABNORMAL HIGH (ref 6.5–8.1)

## 2023-03-06 LAB — LIPID PANEL
Cholesterol: 113 mg/dL (ref 0–200)
HDL: 47 mg/dL (ref >40)
LDL Cholesterol: 54 mg/dL (ref 0–99)
Total CHOL/HDL Ratio: 2.4 RATIO
Triglycerides: 59 mg/dL (ref <150)
VLDL: 12 mg/dL (ref 0–40)

## 2023-03-06 LAB — PSA: Prostatic Specific Antigen: 0.24 ng/mL (ref 0.00–4.00)

## 2023-03-25 DIAGNOSIS — G894 Chronic pain syndrome: Secondary | ICD-10-CM | POA: Diagnosis not present

## 2023-03-25 DIAGNOSIS — D649 Anemia, unspecified: Secondary | ICD-10-CM | POA: Diagnosis not present

## 2023-03-25 DIAGNOSIS — L89623 Pressure ulcer of left heel, stage 3: Secondary | ICD-10-CM | POA: Diagnosis not present

## 2023-03-25 DIAGNOSIS — I10 Essential (primary) hypertension: Secondary | ICD-10-CM | POA: Diagnosis not present

## 2023-03-25 DIAGNOSIS — K2100 Gastro-esophageal reflux disease with esophagitis, without bleeding: Secondary | ICD-10-CM | POA: Diagnosis not present

## 2023-03-25 DIAGNOSIS — G4089 Other seizures: Secondary | ICD-10-CM | POA: Diagnosis not present

## 2023-03-25 DIAGNOSIS — E7849 Other hyperlipidemia: Secondary | ICD-10-CM | POA: Diagnosis not present

## 2023-03-25 DIAGNOSIS — I872 Venous insufficiency (chronic) (peripheral): Secondary | ICD-10-CM | POA: Diagnosis not present

## 2023-03-25 DIAGNOSIS — F329 Major depressive disorder, single episode, unspecified: Secondary | ICD-10-CM | POA: Diagnosis not present

## 2023-03-25 DIAGNOSIS — G809 Cerebral palsy, unspecified: Secondary | ICD-10-CM | POA: Diagnosis not present

## 2023-06-24 DIAGNOSIS — I10 Essential (primary) hypertension: Secondary | ICD-10-CM | POA: Diagnosis not present

## 2023-06-24 DIAGNOSIS — L89000 Pressure ulcer of unspecified elbow, unstageable: Secondary | ICD-10-CM | POA: Diagnosis not present

## 2023-06-24 DIAGNOSIS — G4089 Other seizures: Secondary | ICD-10-CM | POA: Diagnosis not present

## 2023-06-24 DIAGNOSIS — G809 Cerebral palsy, unspecified: Secondary | ICD-10-CM | POA: Diagnosis not present

## 2023-06-24 DIAGNOSIS — I872 Venous insufficiency (chronic) (peripheral): Secondary | ICD-10-CM | POA: Diagnosis not present

## 2023-06-24 DIAGNOSIS — F329 Major depressive disorder, single episode, unspecified: Secondary | ICD-10-CM | POA: Diagnosis not present

## 2023-06-24 DIAGNOSIS — G894 Chronic pain syndrome: Secondary | ICD-10-CM | POA: Diagnosis not present

## 2023-06-24 DIAGNOSIS — K2100 Gastro-esophageal reflux disease with esophagitis, without bleeding: Secondary | ICD-10-CM | POA: Diagnosis not present

## 2023-06-24 DIAGNOSIS — D649 Anemia, unspecified: Secondary | ICD-10-CM | POA: Diagnosis not present

## 2023-06-24 DIAGNOSIS — E7849 Other hyperlipidemia: Secondary | ICD-10-CM | POA: Diagnosis not present

## 2023-06-30 DIAGNOSIS — Z87891 Personal history of nicotine dependence: Secondary | ICD-10-CM | POA: Diagnosis not present

## 2023-06-30 DIAGNOSIS — L89013 Pressure ulcer of right elbow, stage 3: Secondary | ICD-10-CM | POA: Diagnosis not present

## 2023-06-30 DIAGNOSIS — I872 Venous insufficiency (chronic) (peripheral): Secondary | ICD-10-CM | POA: Diagnosis not present

## 2023-06-30 DIAGNOSIS — D649 Anemia, unspecified: Secondary | ICD-10-CM | POA: Diagnosis not present

## 2023-06-30 DIAGNOSIS — G4089 Other seizures: Secondary | ICD-10-CM | POA: Diagnosis not present

## 2023-06-30 DIAGNOSIS — K2100 Gastro-esophageal reflux disease with esophagitis, without bleeding: Secondary | ICD-10-CM | POA: Diagnosis not present

## 2023-06-30 DIAGNOSIS — E7849 Other hyperlipidemia: Secondary | ICD-10-CM | POA: Diagnosis not present

## 2023-06-30 DIAGNOSIS — Z86010 Personal history of colonic polyps: Secondary | ICD-10-CM | POA: Diagnosis not present

## 2023-06-30 DIAGNOSIS — G809 Cerebral palsy, unspecified: Secondary | ICD-10-CM | POA: Diagnosis not present

## 2023-06-30 DIAGNOSIS — I10 Essential (primary) hypertension: Secondary | ICD-10-CM | POA: Diagnosis not present

## 2023-06-30 DIAGNOSIS — G894 Chronic pain syndrome: Secondary | ICD-10-CM | POA: Diagnosis not present

## 2023-06-30 DIAGNOSIS — F329 Major depressive disorder, single episode, unspecified: Secondary | ICD-10-CM | POA: Diagnosis not present

## 2023-07-03 DIAGNOSIS — D649 Anemia, unspecified: Secondary | ICD-10-CM | POA: Diagnosis not present

## 2023-07-03 DIAGNOSIS — G809 Cerebral palsy, unspecified: Secondary | ICD-10-CM | POA: Diagnosis not present

## 2023-07-03 DIAGNOSIS — I10 Essential (primary) hypertension: Secondary | ICD-10-CM | POA: Diagnosis not present

## 2023-07-03 DIAGNOSIS — G4089 Other seizures: Secondary | ICD-10-CM | POA: Diagnosis not present

## 2023-07-03 DIAGNOSIS — L89013 Pressure ulcer of right elbow, stage 3: Secondary | ICD-10-CM | POA: Diagnosis not present

## 2023-07-03 DIAGNOSIS — I872 Venous insufficiency (chronic) (peripheral): Secondary | ICD-10-CM | POA: Diagnosis not present

## 2023-07-08 DIAGNOSIS — I872 Venous insufficiency (chronic) (peripheral): Secondary | ICD-10-CM | POA: Diagnosis not present

## 2023-07-08 DIAGNOSIS — D649 Anemia, unspecified: Secondary | ICD-10-CM | POA: Diagnosis not present

## 2023-07-08 DIAGNOSIS — G809 Cerebral palsy, unspecified: Secondary | ICD-10-CM | POA: Diagnosis not present

## 2023-07-08 DIAGNOSIS — G4089 Other seizures: Secondary | ICD-10-CM | POA: Diagnosis not present

## 2023-07-08 DIAGNOSIS — I10 Essential (primary) hypertension: Secondary | ICD-10-CM | POA: Diagnosis not present

## 2023-07-08 DIAGNOSIS — L89013 Pressure ulcer of right elbow, stage 3: Secondary | ICD-10-CM | POA: Diagnosis not present

## 2023-07-12 DIAGNOSIS — I872 Venous insufficiency (chronic) (peripheral): Secondary | ICD-10-CM | POA: Diagnosis not present

## 2023-07-12 DIAGNOSIS — L89013 Pressure ulcer of right elbow, stage 3: Secondary | ICD-10-CM | POA: Diagnosis not present

## 2023-07-12 DIAGNOSIS — I10 Essential (primary) hypertension: Secondary | ICD-10-CM | POA: Diagnosis not present

## 2023-07-12 DIAGNOSIS — D649 Anemia, unspecified: Secondary | ICD-10-CM | POA: Diagnosis not present

## 2023-07-12 DIAGNOSIS — G4089 Other seizures: Secondary | ICD-10-CM | POA: Diagnosis not present

## 2023-07-12 DIAGNOSIS — G809 Cerebral palsy, unspecified: Secondary | ICD-10-CM | POA: Diagnosis not present

## 2023-07-16 DIAGNOSIS — L89013 Pressure ulcer of right elbow, stage 3: Secondary | ICD-10-CM | POA: Diagnosis not present

## 2023-07-16 DIAGNOSIS — G809 Cerebral palsy, unspecified: Secondary | ICD-10-CM | POA: Diagnosis not present

## 2023-07-16 DIAGNOSIS — I10 Essential (primary) hypertension: Secondary | ICD-10-CM | POA: Diagnosis not present

## 2023-07-16 DIAGNOSIS — I872 Venous insufficiency (chronic) (peripheral): Secondary | ICD-10-CM | POA: Diagnosis not present

## 2023-07-16 DIAGNOSIS — G4089 Other seizures: Secondary | ICD-10-CM | POA: Diagnosis not present

## 2023-07-16 DIAGNOSIS — D649 Anemia, unspecified: Secondary | ICD-10-CM | POA: Diagnosis not present

## 2023-07-19 DIAGNOSIS — G4089 Other seizures: Secondary | ICD-10-CM | POA: Diagnosis not present

## 2023-07-19 DIAGNOSIS — L89013 Pressure ulcer of right elbow, stage 3: Secondary | ICD-10-CM | POA: Diagnosis not present

## 2023-07-19 DIAGNOSIS — D649 Anemia, unspecified: Secondary | ICD-10-CM | POA: Diagnosis not present

## 2023-07-19 DIAGNOSIS — G809 Cerebral palsy, unspecified: Secondary | ICD-10-CM | POA: Diagnosis not present

## 2023-07-19 DIAGNOSIS — I10 Essential (primary) hypertension: Secondary | ICD-10-CM | POA: Diagnosis not present

## 2023-07-19 DIAGNOSIS — I872 Venous insufficiency (chronic) (peripheral): Secondary | ICD-10-CM | POA: Diagnosis not present

## 2023-07-23 DIAGNOSIS — G4089 Other seizures: Secondary | ICD-10-CM | POA: Diagnosis not present

## 2023-07-23 DIAGNOSIS — I872 Venous insufficiency (chronic) (peripheral): Secondary | ICD-10-CM | POA: Diagnosis not present

## 2023-07-23 DIAGNOSIS — L89013 Pressure ulcer of right elbow, stage 3: Secondary | ICD-10-CM | POA: Diagnosis not present

## 2023-07-23 DIAGNOSIS — D649 Anemia, unspecified: Secondary | ICD-10-CM | POA: Diagnosis not present

## 2023-07-23 DIAGNOSIS — I10 Essential (primary) hypertension: Secondary | ICD-10-CM | POA: Diagnosis not present

## 2023-07-23 DIAGNOSIS — G809 Cerebral palsy, unspecified: Secondary | ICD-10-CM | POA: Diagnosis not present

## 2023-07-26 DIAGNOSIS — I10 Essential (primary) hypertension: Secondary | ICD-10-CM | POA: Diagnosis not present

## 2023-07-26 DIAGNOSIS — D649 Anemia, unspecified: Secondary | ICD-10-CM | POA: Diagnosis not present

## 2023-07-26 DIAGNOSIS — L89013 Pressure ulcer of right elbow, stage 3: Secondary | ICD-10-CM | POA: Diagnosis not present

## 2023-07-26 DIAGNOSIS — G4089 Other seizures: Secondary | ICD-10-CM | POA: Diagnosis not present

## 2023-07-26 DIAGNOSIS — G809 Cerebral palsy, unspecified: Secondary | ICD-10-CM | POA: Diagnosis not present

## 2023-07-26 DIAGNOSIS — I872 Venous insufficiency (chronic) (peripheral): Secondary | ICD-10-CM | POA: Diagnosis not present

## 2023-07-30 DIAGNOSIS — Z86010 Personal history of colonic polyps: Secondary | ICD-10-CM | POA: Diagnosis not present

## 2023-07-30 DIAGNOSIS — K2100 Gastro-esophageal reflux disease with esophagitis, without bleeding: Secondary | ICD-10-CM | POA: Diagnosis not present

## 2023-07-30 DIAGNOSIS — I10 Essential (primary) hypertension: Secondary | ICD-10-CM | POA: Diagnosis not present

## 2023-07-30 DIAGNOSIS — D649 Anemia, unspecified: Secondary | ICD-10-CM | POA: Diagnosis not present

## 2023-07-30 DIAGNOSIS — F329 Major depressive disorder, single episode, unspecified: Secondary | ICD-10-CM | POA: Diagnosis not present

## 2023-07-30 DIAGNOSIS — E7849 Other hyperlipidemia: Secondary | ICD-10-CM | POA: Diagnosis not present

## 2023-07-30 DIAGNOSIS — G809 Cerebral palsy, unspecified: Secondary | ICD-10-CM | POA: Diagnosis not present

## 2023-07-30 DIAGNOSIS — L89013 Pressure ulcer of right elbow, stage 3: Secondary | ICD-10-CM | POA: Diagnosis not present

## 2023-07-30 DIAGNOSIS — G4089 Other seizures: Secondary | ICD-10-CM | POA: Diagnosis not present

## 2023-07-30 DIAGNOSIS — I872 Venous insufficiency (chronic) (peripheral): Secondary | ICD-10-CM | POA: Diagnosis not present

## 2023-07-30 DIAGNOSIS — G894 Chronic pain syndrome: Secondary | ICD-10-CM | POA: Diagnosis not present

## 2023-07-30 DIAGNOSIS — Z87891 Personal history of nicotine dependence: Secondary | ICD-10-CM | POA: Diagnosis not present

## 2023-07-31 DIAGNOSIS — I10 Essential (primary) hypertension: Secondary | ICD-10-CM | POA: Diagnosis not present

## 2023-07-31 DIAGNOSIS — L97929 Non-pressure chronic ulcer of unspecified part of left lower leg with unspecified severity: Secondary | ICD-10-CM | POA: Diagnosis not present

## 2023-07-31 DIAGNOSIS — L89000 Pressure ulcer of unspecified elbow, unstageable: Secondary | ICD-10-CM | POA: Diagnosis not present

## 2023-08-02 DIAGNOSIS — L89013 Pressure ulcer of right elbow, stage 3: Secondary | ICD-10-CM | POA: Diagnosis not present

## 2023-08-02 DIAGNOSIS — G4089 Other seizures: Secondary | ICD-10-CM | POA: Diagnosis not present

## 2023-08-02 DIAGNOSIS — G809 Cerebral palsy, unspecified: Secondary | ICD-10-CM | POA: Diagnosis not present

## 2023-08-02 DIAGNOSIS — I872 Venous insufficiency (chronic) (peripheral): Secondary | ICD-10-CM | POA: Diagnosis not present

## 2023-08-02 DIAGNOSIS — I10 Essential (primary) hypertension: Secondary | ICD-10-CM | POA: Diagnosis not present

## 2023-08-02 DIAGNOSIS — D649 Anemia, unspecified: Secondary | ICD-10-CM | POA: Diagnosis not present

## 2023-08-06 DIAGNOSIS — G809 Cerebral palsy, unspecified: Secondary | ICD-10-CM | POA: Diagnosis not present

## 2023-08-06 DIAGNOSIS — L89013 Pressure ulcer of right elbow, stage 3: Secondary | ICD-10-CM | POA: Diagnosis not present

## 2023-08-06 DIAGNOSIS — I10 Essential (primary) hypertension: Secondary | ICD-10-CM | POA: Diagnosis not present

## 2023-08-06 DIAGNOSIS — G4089 Other seizures: Secondary | ICD-10-CM | POA: Diagnosis not present

## 2023-08-06 DIAGNOSIS — D649 Anemia, unspecified: Secondary | ICD-10-CM | POA: Diagnosis not present

## 2023-08-06 DIAGNOSIS — I872 Venous insufficiency (chronic) (peripheral): Secondary | ICD-10-CM | POA: Diagnosis not present

## 2023-08-09 DIAGNOSIS — L89013 Pressure ulcer of right elbow, stage 3: Secondary | ICD-10-CM | POA: Diagnosis not present

## 2023-08-09 DIAGNOSIS — I10 Essential (primary) hypertension: Secondary | ICD-10-CM | POA: Diagnosis not present

## 2023-08-09 DIAGNOSIS — I872 Venous insufficiency (chronic) (peripheral): Secondary | ICD-10-CM | POA: Diagnosis not present

## 2023-08-09 DIAGNOSIS — G809 Cerebral palsy, unspecified: Secondary | ICD-10-CM | POA: Diagnosis not present

## 2023-08-09 DIAGNOSIS — D649 Anemia, unspecified: Secondary | ICD-10-CM | POA: Diagnosis not present

## 2023-08-09 DIAGNOSIS — G4089 Other seizures: Secondary | ICD-10-CM | POA: Diagnosis not present

## 2023-08-12 DIAGNOSIS — I10 Essential (primary) hypertension: Secondary | ICD-10-CM | POA: Diagnosis not present

## 2023-08-12 DIAGNOSIS — I872 Venous insufficiency (chronic) (peripheral): Secondary | ICD-10-CM | POA: Diagnosis not present

## 2023-08-12 DIAGNOSIS — G4089 Other seizures: Secondary | ICD-10-CM | POA: Diagnosis not present

## 2023-08-12 DIAGNOSIS — L89013 Pressure ulcer of right elbow, stage 3: Secondary | ICD-10-CM | POA: Diagnosis not present

## 2023-08-12 DIAGNOSIS — G809 Cerebral palsy, unspecified: Secondary | ICD-10-CM | POA: Diagnosis not present

## 2023-08-12 DIAGNOSIS — D649 Anemia, unspecified: Secondary | ICD-10-CM | POA: Diagnosis not present

## 2023-08-16 DIAGNOSIS — G809 Cerebral palsy, unspecified: Secondary | ICD-10-CM | POA: Diagnosis not present

## 2023-08-16 DIAGNOSIS — L89013 Pressure ulcer of right elbow, stage 3: Secondary | ICD-10-CM | POA: Diagnosis not present

## 2023-08-16 DIAGNOSIS — G4089 Other seizures: Secondary | ICD-10-CM | POA: Diagnosis not present

## 2023-08-16 DIAGNOSIS — I10 Essential (primary) hypertension: Secondary | ICD-10-CM | POA: Diagnosis not present

## 2023-08-16 DIAGNOSIS — D649 Anemia, unspecified: Secondary | ICD-10-CM | POA: Diagnosis not present

## 2023-08-16 DIAGNOSIS — I872 Venous insufficiency (chronic) (peripheral): Secondary | ICD-10-CM | POA: Diagnosis not present

## 2023-08-19 ENCOUNTER — Emergency Department (HOSPITAL_COMMUNITY): Payer: Medicare Other

## 2023-08-19 ENCOUNTER — Other Ambulatory Visit: Payer: Self-pay

## 2023-08-19 ENCOUNTER — Encounter (HOSPITAL_COMMUNITY): Payer: Self-pay | Admitting: *Deleted

## 2023-08-19 ENCOUNTER — Inpatient Hospital Stay (HOSPITAL_COMMUNITY)
Admission: EM | Admit: 2023-08-19 | Discharge: 2023-08-24 | DRG: 871 | Disposition: A | Discharge status: Other Acute Inpatient Hospital | Payer: Medicare Other | Attending: Internal Medicine | Admitting: Internal Medicine

## 2023-08-19 DIAGNOSIS — G808 Other cerebral palsy: Secondary | ICD-10-CM | POA: Diagnosis not present

## 2023-08-19 DIAGNOSIS — F32A Depression, unspecified: Secondary | ICD-10-CM | POA: Diagnosis present

## 2023-08-19 DIAGNOSIS — A419 Sepsis, unspecified organism: Secondary | ICD-10-CM | POA: Diagnosis not present

## 2023-08-19 DIAGNOSIS — E876 Hypokalemia: Secondary | ICD-10-CM | POA: Diagnosis not present

## 2023-08-19 DIAGNOSIS — R456 Violent behavior: Secondary | ICD-10-CM | POA: Diagnosis not present

## 2023-08-19 DIAGNOSIS — L89893 Pressure ulcer of other site, stage 3: Secondary | ICD-10-CM | POA: Diagnosis not present

## 2023-08-19 DIAGNOSIS — R569 Unspecified convulsions: Secondary | ICD-10-CM | POA: Diagnosis present

## 2023-08-19 DIAGNOSIS — E46 Unspecified protein-calorie malnutrition: Secondary | ICD-10-CM | POA: Diagnosis not present

## 2023-08-19 DIAGNOSIS — L89010 Pressure ulcer of right elbow, unstageable: Secondary | ICD-10-CM | POA: Diagnosis present

## 2023-08-19 DIAGNOSIS — R4182 Altered mental status, unspecified: Secondary | ICD-10-CM | POA: Diagnosis not present

## 2023-08-19 DIAGNOSIS — L89894 Pressure ulcer of other site, stage 4: Secondary | ICD-10-CM | POA: Diagnosis not present

## 2023-08-19 DIAGNOSIS — F05 Delirium due to known physiological condition: Secondary | ICD-10-CM

## 2023-08-19 DIAGNOSIS — E8809 Other disorders of plasma-protein metabolism, not elsewhere classified: Secondary | ICD-10-CM | POA: Diagnosis present

## 2023-08-19 DIAGNOSIS — I63511 Cerebral infarction due to unspecified occlusion or stenosis of right middle cerebral artery: Secondary | ICD-10-CM | POA: Diagnosis not present

## 2023-08-19 DIAGNOSIS — K449 Diaphragmatic hernia without obstruction or gangrene: Secondary | ICD-10-CM | POA: Diagnosis present

## 2023-08-19 DIAGNOSIS — A4101 Sepsis due to Methicillin susceptible Staphylococcus aureus: Secondary | ICD-10-CM | POA: Diagnosis not present

## 2023-08-19 DIAGNOSIS — Z6820 Body mass index (BMI) 20.0-20.9, adult: Secondary | ICD-10-CM

## 2023-08-19 DIAGNOSIS — Z87891 Personal history of nicotine dependence: Secondary | ICD-10-CM

## 2023-08-19 DIAGNOSIS — Z452 Encounter for adjustment and management of vascular access device: Secondary | ICD-10-CM | POA: Diagnosis not present

## 2023-08-19 DIAGNOSIS — R0902 Hypoxemia: Secondary | ICD-10-CM | POA: Diagnosis present

## 2023-08-19 DIAGNOSIS — G9389 Other specified disorders of brain: Secondary | ICD-10-CM | POA: Diagnosis not present

## 2023-08-19 DIAGNOSIS — R54 Age-related physical debility: Secondary | ICD-10-CM | POA: Diagnosis present

## 2023-08-19 DIAGNOSIS — R6521 Severe sepsis with septic shock: Secondary | ICD-10-CM | POA: Diagnosis present

## 2023-08-19 DIAGNOSIS — R532 Functional quadriplegia: Secondary | ICD-10-CM | POA: Diagnosis present

## 2023-08-19 DIAGNOSIS — D751 Secondary polycythemia: Secondary | ICD-10-CM | POA: Diagnosis present

## 2023-08-19 DIAGNOSIS — G809 Cerebral palsy, unspecified: Secondary | ICD-10-CM | POA: Diagnosis not present

## 2023-08-19 DIAGNOSIS — K5641 Fecal impaction: Secondary | ICD-10-CM | POA: Diagnosis present

## 2023-08-19 DIAGNOSIS — L89623 Pressure ulcer of left heel, stage 3: Secondary | ICD-10-CM | POA: Diagnosis not present

## 2023-08-19 DIAGNOSIS — D638 Anemia in other chronic diseases classified elsewhere: Secondary | ICD-10-CM | POA: Diagnosis not present

## 2023-08-19 DIAGNOSIS — K2211 Ulcer of esophagus with bleeding: Secondary | ICD-10-CM | POA: Diagnosis present

## 2023-08-19 DIAGNOSIS — R7401 Elevation of levels of liver transaminase levels: Secondary | ICD-10-CM | POA: Diagnosis not present

## 2023-08-19 DIAGNOSIS — L8996 Pressure-induced deep tissue damage of unspecified site: Secondary | ICD-10-CM | POA: Diagnosis not present

## 2023-08-19 DIAGNOSIS — K922 Gastrointestinal hemorrhage, unspecified: Secondary | ICD-10-CM | POA: Diagnosis not present

## 2023-08-19 DIAGNOSIS — R29818 Other symptoms and signs involving the nervous system: Secondary | ICD-10-CM | POA: Diagnosis not present

## 2023-08-19 DIAGNOSIS — R578 Other shock: Secondary | ICD-10-CM | POA: Diagnosis present

## 2023-08-19 DIAGNOSIS — L89310 Pressure ulcer of right buttock, unstageable: Secondary | ICD-10-CM | POA: Diagnosis present

## 2023-08-19 DIAGNOSIS — E871 Hypo-osmolality and hyponatremia: Secondary | ICD-10-CM | POA: Diagnosis present

## 2023-08-19 DIAGNOSIS — R64 Cachexia: Secondary | ICD-10-CM | POA: Diagnosis present

## 2023-08-19 DIAGNOSIS — E43 Unspecified severe protein-calorie malnutrition: Secondary | ICD-10-CM | POA: Diagnosis present

## 2023-08-19 DIAGNOSIS — M436 Torticollis: Secondary | ICD-10-CM | POA: Diagnosis not present

## 2023-08-19 DIAGNOSIS — E785 Hyperlipidemia, unspecified: Secondary | ICD-10-CM | POA: Diagnosis present

## 2023-08-19 DIAGNOSIS — R000 Tachycardia, unspecified: Secondary | ICD-10-CM | POA: Diagnosis not present

## 2023-08-19 DIAGNOSIS — R404 Transient alteration of awareness: Secondary | ICD-10-CM | POA: Diagnosis not present

## 2023-08-19 DIAGNOSIS — S92342A Displaced fracture of fourth metatarsal bone, left foot, initial encounter for closed fracture: Secondary | ICD-10-CM | POA: Diagnosis not present

## 2023-08-19 DIAGNOSIS — L89132 Pressure ulcer of right lower back, stage 2: Secondary | ICD-10-CM | POA: Diagnosis present

## 2023-08-19 DIAGNOSIS — Z4682 Encounter for fitting and adjustment of non-vascular catheter: Secondary | ICD-10-CM | POA: Diagnosis not present

## 2023-08-19 DIAGNOSIS — G319 Degenerative disease of nervous system, unspecified: Secondary | ICD-10-CM | POA: Diagnosis not present

## 2023-08-19 DIAGNOSIS — D649 Anemia, unspecified: Secondary | ICD-10-CM | POA: Diagnosis not present

## 2023-08-19 DIAGNOSIS — Z1152 Encounter for screening for COVID-19: Secondary | ICD-10-CM | POA: Diagnosis not present

## 2023-08-19 DIAGNOSIS — R579 Shock, unspecified: Secondary | ICD-10-CM | POA: Diagnosis not present

## 2023-08-19 DIAGNOSIS — F418 Other specified anxiety disorders: Secondary | ICD-10-CM | POA: Diagnosis not present

## 2023-08-19 DIAGNOSIS — R7881 Bacteremia: Secondary | ICD-10-CM | POA: Diagnosis not present

## 2023-08-19 DIAGNOSIS — I10 Essential (primary) hypertension: Secondary | ICD-10-CM | POA: Diagnosis not present

## 2023-08-19 DIAGNOSIS — K920 Hematemesis: Secondary | ICD-10-CM | POA: Diagnosis not present

## 2023-08-19 DIAGNOSIS — R4701 Aphasia: Secondary | ICD-10-CM | POA: Diagnosis present

## 2023-08-19 DIAGNOSIS — K029 Dental caries, unspecified: Secondary | ICD-10-CM | POA: Diagnosis not present

## 2023-08-19 DIAGNOSIS — G9340 Encephalopathy, unspecified: Secondary | ICD-10-CM

## 2023-08-19 DIAGNOSIS — R9389 Abnormal findings on diagnostic imaging of other specified body structures: Secondary | ICD-10-CM | POA: Diagnosis not present

## 2023-08-19 DIAGNOSIS — G8220 Paraplegia, unspecified: Secondary | ICD-10-CM | POA: Diagnosis not present

## 2023-08-19 DIAGNOSIS — Z8719 Personal history of other diseases of the digestive system: Secondary | ICD-10-CM

## 2023-08-19 DIAGNOSIS — K921 Melena: Secondary | ICD-10-CM | POA: Diagnosis not present

## 2023-08-19 DIAGNOSIS — G9341 Metabolic encephalopathy: Secondary | ICD-10-CM | POA: Diagnosis present

## 2023-08-19 DIAGNOSIS — E872 Acidosis, unspecified: Secondary | ICD-10-CM | POA: Diagnosis present

## 2023-08-19 DIAGNOSIS — F419 Anxiety disorder, unspecified: Secondary | ICD-10-CM | POA: Diagnosis present

## 2023-08-19 DIAGNOSIS — B9561 Methicillin susceptible Staphylococcus aureus infection as the cause of diseases classified elsewhere: Secondary | ICD-10-CM | POA: Diagnosis not present

## 2023-08-19 DIAGNOSIS — D500 Iron deficiency anemia secondary to blood loss (chronic): Secondary | ICD-10-CM | POA: Diagnosis present

## 2023-08-19 DIAGNOSIS — L89159 Pressure ulcer of sacral region, unspecified stage: Secondary | ICD-10-CM | POA: Diagnosis not present

## 2023-08-19 DIAGNOSIS — Z79899 Other long term (current) drug therapy: Secondary | ICD-10-CM

## 2023-08-19 DIAGNOSIS — G459 Transient cerebral ischemic attack, unspecified: Secondary | ICD-10-CM | POA: Diagnosis not present

## 2023-08-19 DIAGNOSIS — B9562 Methicillin resistant Staphylococcus aureus infection as the cause of diseases classified elsewhere: Secondary | ICD-10-CM | POA: Diagnosis not present

## 2023-08-19 DIAGNOSIS — Z993 Dependence on wheelchair: Secondary | ICD-10-CM

## 2023-08-19 DIAGNOSIS — L89890 Pressure ulcer of other site, unstageable: Secondary | ICD-10-CM | POA: Diagnosis present

## 2023-08-19 DIAGNOSIS — Z7401 Bed confinement status: Secondary | ICD-10-CM | POA: Diagnosis not present

## 2023-08-19 DIAGNOSIS — R682 Dry mouth, unspecified: Secondary | ICD-10-CM | POA: Diagnosis present

## 2023-08-19 DIAGNOSIS — K760 Fatty (change of) liver, not elsewhere classified: Secondary | ICD-10-CM | POA: Diagnosis not present

## 2023-08-19 LAB — DIFFERENTIAL
Abs Immature Granulocytes: 0.06 10*3/uL (ref 0.00–0.07)
Basophils Absolute: 0 10*3/uL (ref 0.0–0.1)
Basophils Relative: 0 %
Eosinophils Absolute: 0.3 10*3/uL (ref 0.0–0.5)
Eosinophils Relative: 2 %
Immature Granulocytes: 0 %
Lymphocytes Relative: 15 %
Lymphs Abs: 2.1 10*3/uL (ref 0.7–4.0)
Monocytes Absolute: 1.1 10*3/uL — ABNORMAL HIGH (ref 0.1–1.0)
Monocytes Relative: 8 %
Neutro Abs: 10.2 10*3/uL — ABNORMAL HIGH (ref 1.7–7.7)
Neutrophils Relative %: 75 %

## 2023-08-19 LAB — URINALYSIS, W/ REFLEX TO CULTURE (INFECTION SUSPECTED)
Bilirubin Urine: NEGATIVE
Glucose, UA: NEGATIVE mg/dL
Hgb urine dipstick: NEGATIVE
Ketones, ur: NEGATIVE mg/dL
Leukocytes,Ua: NEGATIVE
Nitrite: NEGATIVE
Protein, ur: NEGATIVE mg/dL
Specific Gravity, Urine: 1.011 (ref 1.005–1.030)
pH: 6 (ref 5.0–8.0)

## 2023-08-19 LAB — COMPREHENSIVE METABOLIC PANEL
ALT: 30 U/L (ref 0–44)
AST: 40 U/L (ref 15–41)
Albumin: 1.8 g/dL — ABNORMAL LOW (ref 3.5–5.0)
Alkaline Phosphatase: 95 U/L (ref 38–126)
Anion gap: 7 (ref 5–15)
BUN: 16 mg/dL (ref 6–20)
CO2: 24 mmol/L (ref 22–32)
Calcium: 7.8 mg/dL — ABNORMAL LOW (ref 8.9–10.3)
Chloride: 104 mmol/L (ref 98–111)
Creatinine, Ser: 0.55 mg/dL — ABNORMAL LOW (ref 0.61–1.24)
GFR, Estimated: >60 mL/min (ref >60)
Glucose, Bld: 92 mg/dL (ref 70–99)
Potassium: 3.9 mmol/L (ref 3.5–5.1)
Sodium: 135 mmol/L (ref 135–145)
Total Bilirubin: 0.5 mg/dL (ref 0.3–1.2)
Total Protein: 8.5 g/dL — ABNORMAL HIGH (ref 6.5–8.1)

## 2023-08-19 LAB — CBC
HCT: 19.9 % — ABNORMAL LOW (ref 39.0–52.0)
Hemoglobin: 4.5 g/dL — CL (ref 13.0–17.0)
MCH: 14.1 pg — ABNORMAL LOW (ref 26.0–34.0)
MCHC: 22.6 g/dL — ABNORMAL LOW (ref 30.0–36.0)
MCV: 62.4 fL — ABNORMAL LOW (ref 80.0–100.0)
Platelets: 526 10*3/uL — ABNORMAL HIGH (ref 150–400)
RBC: 3.19 MIL/uL — ABNORMAL LOW (ref 4.22–5.81)
RDW: 27.1 % — ABNORMAL HIGH (ref 11.5–15.5)
WBC: 13.9 10*3/uL — ABNORMAL HIGH (ref 4.0–10.5)
nRBC: 0.9 % — ABNORMAL HIGH (ref 0.0–0.2)

## 2023-08-19 LAB — PHENOBARBITAL LEVEL: Phenobarbital: <5 ug/mL — ABNORMAL LOW (ref 15.0–40.0)

## 2023-08-19 LAB — ETHANOL: Alcohol, Ethyl (B): <10 mg/dL (ref <10)

## 2023-08-19 LAB — RAPID URINE DRUG SCREEN, HOSP PERFORMED
Amphetamines: NOT DETECTED
Barbiturates: NOT DETECTED
Benzodiazepines: NOT DETECTED
Cocaine: NOT DETECTED
Opiates: NOT DETECTED
Tetrahydrocannabinol: NOT DETECTED

## 2023-08-19 LAB — PROTIME-INR
INR: 1.5 — ABNORMAL HIGH (ref 0.8–1.2)
Prothrombin Time: 18.6 seconds — ABNORMAL HIGH (ref 11.4–15.2)

## 2023-08-19 LAB — RESP PANEL BY RT-PCR (RSV, FLU A&B, COVID)  RVPGX2
Influenza A by PCR: NEGATIVE
Influenza B by PCR: NEGATIVE
Resp Syncytial Virus by PCR: NEGATIVE
SARS Coronavirus 2 by RT PCR: NEGATIVE

## 2023-08-19 LAB — PREPARE RBC (CROSSMATCH)

## 2023-08-19 LAB — APTT: aPTT: 28 seconds (ref 24–36)

## 2023-08-19 LAB — LACTIC ACID, PLASMA
Lactic Acid, Venous: 1.8 mmol/L (ref 0.5–1.9)
Lactic Acid, Venous: 5.4 mmol/L (ref 0.5–1.9)

## 2023-08-19 LAB — AMMONIA: Ammonia: 27 umol/L (ref 9–35)

## 2023-08-19 LAB — VITAMIN B12: Vitamin B-12: 1303 pg/mL — ABNORMAL HIGH (ref 180–914)

## 2023-08-19 LAB — MAGNESIUM: Magnesium: 2 mg/dL (ref 1.7–2.4)

## 2023-08-19 MED ORDER — VANCOMYCIN HCL 500 MG/100ML IV SOLN
500.0000 mg | Freq: Two times a day (BID) | INTRAVENOUS | Status: DC
  Administered 2023-08-20 (×2): 500 mg via INTRAVENOUS
  Filled 2023-08-19 (×5): qty 100

## 2023-08-19 MED ORDER — LACTATED RINGERS IV SOLN: INTRAVENOUS | Status: DC

## 2023-08-19 MED ORDER — NOREPINEPHRINE 4 MG/250ML-% IV SOLN
0.0000 ug/min | INTRAVENOUS | Status: DC
  Administered 2023-08-19: 2 ug/min via INTRAVENOUS
  Administered 2023-08-20: 11 ug/min via INTRAVENOUS
  Administered 2023-08-21: 2 ug/min via INTRAVENOUS
  Filled 2023-08-19 (×4): qty 250

## 2023-08-19 MED ORDER — VANCOMYCIN HCL IN DEXTROSE 1-5 GM/200ML-% IV SOLN
1000.0000 mg | Freq: Once | INTRAVENOUS | Status: AC
  Administered 2023-08-19: 1000 mg via INTRAVENOUS
  Filled 2023-08-19: qty 200

## 2023-08-19 MED ORDER — LACTATED RINGERS IV BOLUS (SEPSIS)
500.0000 mL | Freq: Once | INTRAVENOUS | Status: AC
  Administered 2023-08-19: 500 mL via INTRAVENOUS

## 2023-08-19 MED ORDER — METRONIDAZOLE 500 MG/100ML IV SOLN
500.0000 mg | Freq: Once | INTRAVENOUS | Status: AC
  Administered 2023-08-19: 500 mg via INTRAVENOUS
  Filled 2023-08-19: qty 100

## 2023-08-19 MED ORDER — CALCIUM GLUCONATE-NACL 1-0.675 GM/50ML-% IV SOLN
1.0000 g | Freq: Once | INTRAVENOUS | Status: AC
  Administered 2023-08-19: 1000 mg via INTRAVENOUS
  Filled 2023-08-19: qty 50

## 2023-08-19 MED ORDER — SODIUM CHLORIDE 0.9 % IV BOLUS
1000.0000 mL | Freq: Once | INTRAVENOUS | Status: AC
  Administered 2023-08-19: 1000 mL via INTRAVENOUS

## 2023-08-19 MED ORDER — SODIUM CHLORIDE 0.9% IV SOLUTION: Freq: Once | INTRAVENOUS | Status: AC

## 2023-08-19 MED ORDER — ACETAMINOPHEN 325 MG PO TABS: 650.0000 mg | ORAL_TABLET | Freq: Once | ORAL | Status: DC

## 2023-08-19 MED ORDER — SODIUM CHLORIDE 0.9 % IV SOLN
2.0000 g | Freq: Three times a day (TID) | INTRAVENOUS | Status: DC
  Administered 2023-08-20: 2 g via INTRAVENOUS
  Filled 2023-08-19: qty 12.5

## 2023-08-19 MED ORDER — SODIUM CHLORIDE 0.9 % IV SOLN
2.0000 g | Freq: Once | INTRAVENOUS | Status: AC
  Administered 2023-08-19: 2 g via INTRAVENOUS
  Filled 2023-08-19: qty 12.5

## 2023-08-19 MED ORDER — IOHEXOL 300 MG/ML  SOLN: 100.0000 mL | Freq: Once | INTRAMUSCULAR | Status: DC | PRN

## 2023-08-19 MED ORDER — LACTATED RINGERS IV BOLUS
1000.0000 mL | Freq: Once | INTRAVENOUS | Status: AC
  Administered 2023-08-19: 1000 mL via INTRAVENOUS

## 2023-08-19 MED ORDER — IOHEXOL 350 MG/ML SOLN
75.0000 mL | Freq: Once | INTRAVENOUS | Status: AC | PRN
  Administered 2023-08-19: 75 mL via INTRAVENOUS

## 2023-08-19 MED ORDER — FENTANYL CITRATE PF 50 MCG/ML IJ SOSY
25.0000 ug | PREFILLED_SYRINGE | Freq: Once | INTRAMUSCULAR | Status: AC
  Administered 2023-08-19: 25 ug via INTRAVENOUS
  Filled 2023-08-19: qty 1

## 2023-08-19 NOTE — Progress Notes (Signed)
1908 Pt transported back to room from CT at this time.

## 2023-08-19 NOTE — ED Notes (Signed)
Pt return from CT scanner, stable, no changes noted

## 2023-08-19 NOTE — ED Notes (Signed)
Pt placed on [employer]

## 2023-08-19 NOTE — Progress Notes (Signed)
1822 Stroke cart activated and elert sent to [facility] for pre-arrival of pt via EMS with stroke like symptoms. 1830 Pt arrives via stretcher with EMS. EDP Durwin Nora, MD) at bedside assessing pt. Pt arrives with c/o aphasia and AMS from SNF with LKW 1715.  1830 Pt taken to CT and Dr. Iver Nestle with [facility] paged at this time. 1610 Dr. Iver Nestle connected via stroke cart in CT and assessing pt at this time.

## 2023-08-19 NOTE — ED Notes (Signed)
CODE STROKE CANCELED PER DR. DIXION

## 2023-08-19 NOTE — ED Notes (Addendum)
Dr. Jaci Lazier to hold off on central line placement d/t pt's body contractions, will place a right EJ

## 2023-08-19 NOTE — ED Notes (Signed)
Pt with malodorous urine. Numerous wounds noted to flank, back, scrotum, sacrum, legs. MD to bedside to reassess patient, aware pt hypotensive. IVF initiated.

## 2023-08-19 NOTE — ED Provider Notes (Signed)
[place] EMERGENCY DEPARTMENT AT [facility] Provider Note   CSN: [id number] Arrival date & time: 08/19/23  1830  An emergency department physician performed an initial assessment on this suspected stroke patient at 1830.  History  Chief Complaint  Patient presents with   Altered Mental Status    MONTARIO DEVOL is a 57 y.o. male.  HPI Patient presents for aphasia.  He resides in a nursing facility for unknown reasons.  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  Despite this, he is reportedly alert and oriented and conversant at baseline.  Staff at nursing facility noted an acute change at 1715.  At that time, he became aphasic.  EMS noted CBG of 120.  Seizure history is unknown.  Per chart review, his history includes cerebral palsy, seizures, anxiety, depression, gastritis, polycythemia.  He is not prescribed a blood thinner.  He is prescribed phenobarbital and Ativan.    Home Medications Prior to Admission medications   Medication Sig Start Date End Date Taking? Authorizing Provider  albuterol (VENTOLIN HFA) 108 (90 Base) MCG/ACT inhaler Inhale 2 puffs into the lungs every 4 (four) hours as needed for wheezing or shortness of breath.   Yes [provider]  atorvastatin (LIPITOR) 20 MG tablet  01/31/18  Yes [provider]  carvedilol (COREG) 3.125 MG tablet Take 3.125 mg by mouth 2 (two) times daily with a meal. 09/25/16  Yes [provider]  cyclobenzaprine (FLEXERIL) 10 MG tablet Take 10 mg by mouth 3 (three) times daily.   Yes [provider]  docusate sodium (COLACE) 100 MG capsule Take 100 mg by mouth every morning.   Yes [provider]  furosemide (LASIX) 20 MG tablet Take 40 mg by mouth daily. 09/26/16  Yes [provider]  liver oil-zinc oxide (BOUDREAUXS BUTT PASTE) 40 % ointment Apply 1 application topically 2 (two) times daily as needed for irritation.    Yes [provider]   Multiple Vitamins-Minerals (CENTRUM SILVER 50+MEN) TABS Take 1 tablet by mouth daily.   Yes [provider]  pantoprazole (PROTONIX) 40 MG tablet Take 1 tablet (40 mg total) by mouth 2 (two) times daily before a meal. 05/26/14  Yes Fanta, Wayland Salinas, MD  potassium chloride SA (K-DUR,KLOR-CON) 20 MEQ tablet Take 40 mEq by mouth daily.    Yes [provider]  QUEtiapine (SEROQUEL) 50 MG tablet Take 50 mg by mouth 2 (two) times daily.   Yes [provider]  sertraline (ZOLOFT) 100 MG tablet Take 100 mg by mouth daily.   Yes [provider]  traMADol (ULTRAM) 50 MG tablet Take 50 mg by mouth 2 (two) times daily as needed for moderate pain.    Yes [provider]  LORazepam (ATIVAN) 1 MG tablet Take 1 mg by mouth every 8 (eight) hours as needed for anxiety.    [provider]      Allergies    Patient has no known allergies.    Review of Systems   Review of Systems  Unable to perform ROS: Patient nonverbal    Physical Exam Updated Vital Signs BP 95/62   Pulse 90   Temp 98.1 F (36.7 C) (Bladder)   Resp 18   Ht 5' (1.524 m)   Wt 47.7 kg   SpO2 100%   BMI 20.54 kg/m  Physical Exam Vitals and nursing note reviewed.  Constitutional:      General: He is not in acute distress.    Appearance:  Normal appearance. He is well-developed. He is cachectic. He is ill-appearing (Chronically). He is not toxic-appearing or diaphoretic.  HENT:     Head: Normocephalic and atraumatic.     Right Ear: External ear normal.     Left Ear: External ear normal.     Nose: Nose normal.     Mouth/Throat:     Mouth: Mucous membranes are dry.  Eyes:     Extraocular Movements: Extraocular movements intact.     Conjunctiva/sclera: Conjunctivae normal.  Cardiovascular:     Rate and Rhythm: Normal rate and regular rhythm.     Heart sounds: No murmur heard. Pulmonary:     Effort: Pulmonary effort is normal. No respiratory distress.     Breath sounds:  Normal breath sounds. No wheezing or rales.  Chest:     Chest wall: No tenderness.  Abdominal:     General: There is no distension.     Palpations: Abdomen is soft.     Tenderness: There is no abdominal tenderness.  Musculoskeletal:        General: Deformity present.     Cervical back: Neck supple. No tenderness.     Comments: Contractures in all extremities  Skin:    General: Skin is warm and dry.     Coloration: Skin is not jaundiced or pale.     Comments: Wounds on lower extremities and very stages of healing.  Neurological:     Mental Status: He is alert.     GCS: GCS eye subscore is 4. GCS verbal subscore is 1. GCS motor subscore is 6.     Cranial Nerves: No facial asymmetry.     ED Results / Procedures / Treatments   Labs (all labs ordered are listed, but only abnormal results are displayed) Labs Reviewed  PROTIME-INR - Abnormal; Notable for the following components:      Result Value   Prothrombin Time 18.6 (*)    INR 1.5 (*)    All other components within normal limits  CBC - Abnormal; Notable for the following components:   WBC 13.9 (*)    RBC 3.19 (*)    Hemoglobin 4.5 (*)    HCT 19.9 (*)    MCV 62.4 (*)    MCH 14.1 (*)    MCHC 22.6 (*)    RDW 27.1 (*)    Platelets 526 (*)    nRBC 0.9 (*)    All other components within normal limits  DIFFERENTIAL - Abnormal; Notable for the following components:   Neutro Abs 10.2 (*)    Monocytes Absolute 1.1 (*)    All other components within normal limits  COMPREHENSIVE METABOLIC PANEL - Abnormal; Notable for the following components:   Creatinine, Ser 0.55 (*)    Calcium 7.8 (*)    Total Protein 8.5 (*)    Albumin 1.8 (*)    All other components within normal limits  PHENOBARBITAL LEVEL - Abnormal; Notable for the following components:   Phenobarbital <5.0 (*)    All other components within normal limits  LACTIC ACID, PLASMA - Abnormal; Notable for the following components:   Lactic Acid, Venous 5.4 (*)    All  other components within normal limits  URINALYSIS, W/ REFLEX TO CULTURE (INFECTION SUSPECTED) - Abnormal; Notable for the following components:   Bacteria, UA FEW (*)    All other components within normal limits  VITAMIN B12 - Abnormal; Notable for the following components:   Vitamin B-12 1,303 (*)    All other  components within normal limits  RESP PANEL BY RT-PCR (RSV, FLU A&B, COVID)  RVPGX2  CULTURE, BLOOD (ROUTINE X 2)  CULTURE, BLOOD (ROUTINE X 2)  APTT  ETHANOL  RAPID URINE DRUG SCREEN, HOSP PERFORMED  MAGNESIUM  AMMONIA  LACTIC ACID, PLASMA  TYPE AND SCREEN  PREPARE RBC (CROSSMATCH)    EKG None  Radiology CT HEAD CODE STROKE WO CONTRAST  Result Date: 08/19/2023 CLINICAL DATA:  Code stroke.  Neuro deficit, acute, stroke suspected EXAM: CT HEAD WITHOUT CONTRAST TECHNIQUE: Contiguous axial images were obtained from the base of the skull through the vertex without intravenous contrast. RADIATION DOSE REDUCTION: This exam was performed according to the departmental dose-optimization program which includes automated exposure control, adjustment of the mA and/or kV according to patient size and/or use of iterative reconstruction technique. COMPARISON:  CT head 12/24/2009. FINDINGS: Motion limited study. 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 No clear evidence of acute large vascular territory infarct or acute hemorrhage. No midline shift or hydrocephalus. Cerebral atrophy. Vascular: No hyperdense vessel. Skull: No acute fracture. Sinuses/Orbits: Clear sinuses.  No acute orbital findings. Other: No mastoid effusions. ASPECTS [facility] Stroke Program Early CT Score) total score (0-10 with 10 being normal): 10. IMPRESSION: 1. Motion limited study with interval, but remote appearing infarct in the right insula and overlying operculum. An MRI could provide more sensitive evaluation for acute infarct if  clinically warranted. 2. Remote left frontal infarct. Code stroke imaging results were communicated on 08/19/2023 at 6:51 pm to provider [facility] via telephone, who verbally acknowledged these results. Electronically Signed   By: Feliberto Harts M.D.   On: 08/19/2023 18:52    Procedures Procedures    Medications Ordered in ED Medications  lactated ringers infusion ( Intravenous New Bag/Given 08/19/23 2110)  norepinephrine (LEVOPHED) 4mg  in (0.016 mg/mL) premix infusion (9 mcg/min Intravenous Rate/Dose Change 08/19/23 2125)  vancomycin (VANCOREADY) IVPB 500 mg/100 mL (has no administration in time range)  ceFEPIme (MAXIPIME) 2 g in sodium chloride 0.9 % 100 mL IVPB (has no administration in time range)  sodium chloride 0.9 % bolus 1,000 mL (has no administration in time range)  lactated ringers bolus 1,000 mL (0 mLs Intravenous Stopped 08/19/23 2116)  lactated ringers bolus 500 mL (0 mLs Intravenous Stopped 08/19/23 2109)  ceFEPIme (MAXIPIME) 2 g in sodium chloride 0.9 % 100 mL IVPB (0 g Intravenous Stopped 08/19/23 2030)  metroNIDAZOLE (FLAGYL) IVPB 500 mg (0 mg Intravenous Stopped 08/19/23 2143)  vancomycin (VANCOCIN) IVPB 1000 mg/200 mL premix (1,000 mg Intravenous New Bag/Given 08/19/23 2206)  0.9 %  sodium chloride infusion (Manually program via Guardrails IV Fluids) ( Intravenous New Bag/Given 08/19/23 2147)  fentaNYL (SUBLIMAZE) injection 25 mcg (25 mcg Intravenous Given 08/19/23 2106)  calcium gluconate 1 g/ 50 mL sodium chloride IVPB (1,000 mg Intravenous New Bag/Given 08/19/23 2217)  iohexol (OMNIPAQUE) 350 MG/ML injection 75 mL (75 mLs Intravenous Contrast Given 08/19/23 2258)    ED Course/ Medical Decision Making/ A&P                                 Medical Decision Making Amount and/or Complexity of Data Reviewed Labs: ordered. Radiology: ordered.  Risk Prescription drug management.   This patient presents to the ED for concern of altered mental status, this involves an  extensive number of treatment options, and is a complaint that carries with  it a high risk of complications and morbidity.  The differential diagnosis includes CVA, ICH, seizure, metabolic encephalopathy, sepsis, polypharmacy   Co morbidities that complicate the patient evaluation  Seizures, anxiety, anemia, depression, pressure ulcers, gastritis   Additional history obtained:  Additional history obtained from EMS External records from outside source obtained and reviewed including EMR   Lab Tests:  I Ordered, and personally interpreted labs.  The pertinent results include: Leukocytosis and lactic acidosis are present consistent with sepsis.  He has severe anemia of unknown chronicity.  Microcytosis suggests some component of chronic anemia.  Kidney function and electrolytes are normal.   Imaging Studies ordered:  I ordered imaging studies including CT head, CT of chest, abdomen, pelvis I independently visualized and interpreted imaging which showed remote appearing infarcts, no acute findings on CT head.  CT of CAP pending at time of signout I agree with the radiologist interpretation   Cardiac Monitoring: / EKG:  The patient was maintained on a cardiac monitor.  I personally viewed and interpreted the cardiac monitored which showed an underlying rhythm of: Sinus rhythm   Consultations Obtained:  I requested consultation with the neurologist, Dr. Iver Nestle,  and discussed lab and imaging findings as well as pertinent plan - they recommend: Treatment of underlying sepsis, MRI when possible, EEG if he does not return to baseline   Problem List / ED Course / Critical interventions / Medication management  Patient presenting for an acute change in mental status that occurred 1 hour prior to arrival.  Per staff at his facility, he is alert and oriented and conversant at baseline.  Patient had an abrupt change where he was no longer talking.  On arrival, patient has dry oral mucosa.  He  seems to attempted talk but is able to phonate any words.  He has contractions in all extremities which limits neuroexam.  Per chart review, this seems to be secondary to cerebral palsy.  Given his new onset aphasia, code stroke was initiated.  Will also check for metabolic causes of encephalopathy.  He does have a seizure history and may simply be postictal.  Noncontrasted CTA did not show acute findings.  There was an attempt to undergo MRI but patient unable to lay still at this time.  He was brought back to ED room.  He was found to be hypothermic and hypotensive.  On complete exam, he does have multiple areas of wounds.  Patient was treated for sepsis with presumed skin source.  Will obtain CT imaging to assess other sources of infection as well as depths of wounds and possible need for surgical source control.  He was given fentanyl for analgesia.  I spoke with neurologist on-call, Dr. Iver Nestle, who recommends treating underlying conditions.  She does recommend MRI when able.  If patient does not return to mental baseline following treatment of presumed sepsis, she does recommend EEG.  If he is hemodynamically stabilized, he can be admitted here at [facility].  Lab work is notable for severe anemia with a hemoglobin of 4.5.  Baseline hemoglobin is unknown given absence of any recent lab work.  Of note, he does have microcytosis which does suggest some chronic component to his anemia.  3 units of PRBCs was ordered.  Patient does have a leukocytosis.  Calcium gluconate was given for hypocalcemia, especially in the setting of planned blood transfusions.  Patient's lactate was elevated at 5.4.  He was given additional IV fluids.  CT imaging of chest, abdomen, pelvis was  pending at time of signout.  Care of patient was signed out to oncoming ED provider. I ordered medication including IV fluids and broad-spectrum antibiotics for empiric treatment of sepsis; PRBCs for severe anemia; fentanyl for analgesia; Levophed  for hypotension; calcium gluconate for hypocalcemia Reevaluation of the patient after these medicines showed that the patient improved I have reviewed the patients home medicines and have made adjustments as needed   Social Determinants of Health:  Resides in nursing facility  CRITICAL CARE Performed by: Gloris Manchester   Total critical care time: 32 minutes  Critical care time was exclusive of separately billable procedures and treating other patients.  Critical care was necessary to treat or prevent imminent or life-threatening deterioration.  Critical care was time spent personally by me on the following activities: development of treatment plan with patient and/or surrogate as well as nursing, discussions with consultants, evaluation of patient's response to treatment, examination of patient, obtaining history from patient or surrogate, ordering and performing treatments and interventions, ordering and review of laboratory studies, ordering and review of radiographic studies, pulse oximetry and re-evaluation of patient's condition.        Final Clinical Impression(s) / ED Diagnoses Final diagnoses:  Septic shock (HCC)  Severe anemia  Acute encephalopathy    Rx / DC Orders ED Discharge Orders     None         Gloris Manchester, MD 08/19/23 2320

## 2023-08-19 NOTE — ED Notes (Signed)
Pt transported to CT scanner with this RN, remains on cardiac monitoring devices.

## 2023-08-19 NOTE — ED Notes (Signed)
55M Bair Hugger removed (turned off) as core/bladder temp reading 98.0 F on monitor.

## 2023-08-19 NOTE — Sepsis Progress Note (Signed)
Following for sepsis monitoring ?

## 2023-08-19 NOTE — Progress Notes (Signed)
Pharmacy Antibiotic Note  Ian Hurley is a 57 y.o. male who presented to [facility] ED via EMS on 08/19/2023 for evaluation of slurred speech.  Head CT showed remote infarcts. He was also noted to have various wounds on his body.  Pharmacy has been consulted to dose vancomycin and cefepime for broad empiric coverage.  - scr 0.55 (crcl~69)  Plan: - vancomycin 1000 mg IV x1, then 500mg  IV q12h for est AUC 468 - cefepime 2gm IV q8h  __________________________________________________  Temp (24hrs), Avg:96.3 F (35.7 C), Min:96.3 F (35.7 C), Max:96.3 F (35.7 C)  No results for input(s): "WBC", "CREATININE", "LATICACIDVEN", "VANCOTROUGH", "VANCOPEAK", "VANCORANDOM", "GENTTROUGH", "GENTPEAK", "GENTRANDOM", "TOBRATROUGH", "TOBRAPEAK", "TOBRARND", "AMIKACINPEAK", "AMIKACINTROU", "AMIKACIN" in the last 168 hours.  CrCl cannot be calculated (Patient's most recent lab result is older than the maximum 21 days allowed.).    No Known Allergies   Thank you for allowing pharmacy to be a part of this patient's care.  Lucia Gaskins 08/19/2023 7:26 PM

## 2023-08-19 NOTE — ED Triage Notes (Addendum)
Pt brought in emergency traffic by [facility] with CODE STROKE called in the field by EMS. Pt fro [facility]. Pt was left seen normal at 1655 by his nurse aide. Ten minutes later the aide went in to feed him dinner and he was closing his eyes and had slurred speech. Group home staff report pt does not normally have slurred speech. Legal guardian is sister, Telesforo Campe, and she can be reached at [phone] per the group home. Oneal Grout, owner of [facility], called to give this report. She can be reached at [phone].

## 2023-08-19 NOTE — ED Notes (Signed)
Dr. Posey Rea at bedside, aware of pt's BP back to hypotension, Levophed has been adjusted accordingly per protocols. NS bolus infusing at this time, Dr. Posey Rea to prep for central line placement

## 2023-08-19 NOTE — Consult Note (Signed)
[facility] Telemedicine Consult   Requesting Provider: Gloris Manchester Consult Participants: Myself, bedside nurses, atrium nurse Location of the provider: [facility] [place] Location of the patient: Ian Hurley  This consult was provided via telemedicine with 2-way video and audio communication. The patient/family was informed that care would be provided in this way and agreed to receive care in this manner.    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   Last talked normally to caregiver at ~4:15 PM and was given some water. At 5:15 PM, non verbal, more contracted than usual. Caregiver was unsure if patient may have had a seizure; he was able to take PO tramadol (for concern for pressure ulcer pain) and other scheduled meds (pantoprazole, carvedilol). Caregiver checked back in 20 minutes later and activated EMS due to no improvement (though also no worsening). Reports infrequent use of Ativan, no recent use.   At baseline fully conversant and oriented per caregiver/family; uses electric wheelchair for mobility.   Has multiple pressure ulcers. Nursing staff also notes impacted stool when checking rectal temp and foul smelling urine.   Sister reports she has wanted to wean phenobarbital as patient has not had any seizures in > 20 years but has not been able to complete the testing requested to do this.   Last admission 08/2016 for AMS in the setting of UTI.   LKW: 4:15 PM Thrombolytic given?: No, concern for sepsis / potential endocarditis  IR Thrombectomy? No, baseline MRS and contractures Modified Rankin Scale: 4-Needs assistance to walk and tend to bodily needs Time of teleneurologist evaluation: 6  Exam: Vitals:   08/19/23 1915 08/19/23 1921  BP: (!)  78/53 (!) 63/51  Pulse: 95 92  Temp:    SpO2: 100% 94%    General: Uncomfortable Pulmonary: breathing comfortably Cardiac: regular rate and rhythm on monitor   NIH Stroke scale 1A: Level of Consciousness - 0 1B: Ask Month and Age - 2 1C: 'Blink Eyes' & 'Squeeze Hands' - 0 2: Test Horizontal Extraocular Movements - 0 3: Test Visual Fields - 0 4: Test Facial Palsy - 0 5A: Test Left Arm Motor Drift - 2 5B: Test Right Arm Motor Drift - 2 6A: Test Left Leg Motor Drift - 3 6B: Test Right Leg Motor Drift - 3 7: Test Limb Ataxia - 0 within limits of contractures 8: Test Sensation - 0 9: Test Language/Aphasia- 3 10: Test Dysarthria - 2 11: Test Extinction/Inattention - 0 NIHSS score: 17   Imaging Reviewed:   Head CT 1. Motion limited study with interval, but remote appearing infarct in the right insula and overlying operculum. An MRI could provide more sensitive evaluation for acute infarct if clinically warranted. 2. Remote left frontal infarct. COMPARISON: CT head 12/24/2009.    Labs reviewed in epic and pertinent values follow:  Basic Metabolic Panel: No results for input(s): "NA", "K", "CL", "CO2", "GLUCOSE", "BUN", "CREATININE", "CALCIUM", "MG", "PHOS" in the last 168 hours.  CBC: No results for input(s): "WBC", "NEUTROABS", "HGB", "HCT", "MCV", "PLT" in the last 168 hours.  Coagulation Studies: Recent Labs    08/19/23 1833  LABPROT 18.6*  INR 1.5*     Assessment: Likely toxic/metabolic/infectious (source of UTI or pressure ulcers) processes. CTA considered but given concern for sepsis and patient unable to lie still for even head  CT, risk of dye not felt to outweigh benefit. Low concern for status epilepticus at this time based on history and exam but EEG should be considered if not improving overnight with supportive care.   Recommendations:  - Tramadol contraindicated in patients with seizure history, recommend alternative opiates / pain control that does not  affect seizure threshold  - Agree with phenobarbital level - Continue home phenobarbital at this time; if level results elevated hold for now and reach out to Dr. Melynda Ripple tomorrow for recs - If patient has not returned to baseline by tomorrow, routine EEG - B12 level in addition to ammonia, UA, UDS (goal B12 > 400) - MRI brain when stabilized and able to tolerate scan (not ordered)  - Additional Toxic/metabolic/infectious workup per ED; discussed with Dr. Durwin Nora via phone - Neurology will sign off but don't hesitate to reach out if questions/concerns arise  This patient is receiving care for possible acute neurological changes. There was 60 minutes of care by this provider at the time of service, including time for direct evaluation via telemedicine, review of medical records, imaging studies and discussion of findings with providers, the patient and/or family.  Consult level time thresholds, 20 min (1), 40 min (2), 55 min (3), 80 min (4), 110 min (5)  Brooke Dare MD-PhD [facility] [phone]   If 8pm-8am, please page neurology on call as listed in AMION.  CRITICAL CARE Performed by: Gordy Councilman   Total critical care time: 60 minutes  Critical care time was exclusive of separately billable procedures and treating other patients.  Critical care was necessary to treat or prevent imminent or life-threatening deterioration.  Critical care was time spent personally by me on the following activities: development of treatment plan with patient and/or surrogate as well as nursing, discussions with consultants, evaluation of patient's response to treatment, examination of patient, obtaining history from patient or surrogate, ordering and performing treatments and interventions, ordering and review of laboratory studies, ordering and review of radiographic studies, pulse oximetry and re-evaluation of patient's condition.

## 2023-08-20 ENCOUNTER — Inpatient Hospital Stay (HOSPITAL_COMMUNITY): Payer: Medicare Other

## 2023-08-20 DIAGNOSIS — L89623 Pressure ulcer of left heel, stage 3: Secondary | ICD-10-CM | POA: Diagnosis present

## 2023-08-20 DIAGNOSIS — R578 Other shock: Secondary | ICD-10-CM | POA: Diagnosis present

## 2023-08-20 DIAGNOSIS — K921 Melena: Secondary | ICD-10-CM | POA: Diagnosis not present

## 2023-08-20 DIAGNOSIS — R7881 Bacteremia: Secondary | ICD-10-CM | POA: Diagnosis not present

## 2023-08-20 DIAGNOSIS — E785 Hyperlipidemia, unspecified: Secondary | ICD-10-CM | POA: Diagnosis not present

## 2023-08-20 DIAGNOSIS — E46 Unspecified protein-calorie malnutrition: Secondary | ICD-10-CM | POA: Diagnosis not present

## 2023-08-20 DIAGNOSIS — R000 Tachycardia, unspecified: Secondary | ICD-10-CM | POA: Diagnosis not present

## 2023-08-20 DIAGNOSIS — E871 Hypo-osmolality and hyponatremia: Secondary | ICD-10-CM | POA: Diagnosis present

## 2023-08-20 DIAGNOSIS — E872 Acidosis, unspecified: Secondary | ICD-10-CM | POA: Diagnosis present

## 2023-08-20 DIAGNOSIS — K920 Hematemesis: Secondary | ICD-10-CM | POA: Diagnosis not present

## 2023-08-20 DIAGNOSIS — F32A Depression, unspecified: Secondary | ICD-10-CM | POA: Diagnosis present

## 2023-08-20 DIAGNOSIS — R4701 Aphasia: Secondary | ICD-10-CM | POA: Diagnosis present

## 2023-08-20 DIAGNOSIS — K2211 Ulcer of esophagus with bleeding: Secondary | ICD-10-CM | POA: Diagnosis present

## 2023-08-20 DIAGNOSIS — G809 Cerebral palsy, unspecified: Secondary | ICD-10-CM | POA: Diagnosis not present

## 2023-08-20 DIAGNOSIS — G8220 Paraplegia, unspecified: Secondary | ICD-10-CM | POA: Diagnosis not present

## 2023-08-20 DIAGNOSIS — B9561 Methicillin susceptible Staphylococcus aureus infection as the cause of diseases classified elsewhere: Secondary | ICD-10-CM | POA: Diagnosis not present

## 2023-08-20 DIAGNOSIS — L89010 Pressure ulcer of right elbow, unstageable: Secondary | ICD-10-CM | POA: Diagnosis present

## 2023-08-20 DIAGNOSIS — G9341 Metabolic encephalopathy: Secondary | ICD-10-CM | POA: Diagnosis present

## 2023-08-20 DIAGNOSIS — R4182 Altered mental status, unspecified: Secondary | ICD-10-CM | POA: Diagnosis not present

## 2023-08-20 DIAGNOSIS — Z1152 Encounter for screening for COVID-19: Secondary | ICD-10-CM | POA: Diagnosis not present

## 2023-08-20 DIAGNOSIS — L89159 Pressure ulcer of sacral region, unspecified stage: Secondary | ICD-10-CM | POA: Diagnosis not present

## 2023-08-20 DIAGNOSIS — R6521 Severe sepsis with septic shock: Secondary | ICD-10-CM | POA: Diagnosis present

## 2023-08-20 DIAGNOSIS — R579 Shock, unspecified: Secondary | ICD-10-CM | POA: Diagnosis not present

## 2023-08-20 DIAGNOSIS — B9562 Methicillin resistant Staphylococcus aureus infection as the cause of diseases classified elsewhere: Secondary | ICD-10-CM | POA: Diagnosis not present

## 2023-08-20 DIAGNOSIS — R7401 Elevation of levels of liver transaminase levels: Secondary | ICD-10-CM

## 2023-08-20 DIAGNOSIS — I10 Essential (primary) hypertension: Secondary | ICD-10-CM | POA: Diagnosis present

## 2023-08-20 DIAGNOSIS — R569 Unspecified convulsions: Secondary | ICD-10-CM | POA: Diagnosis present

## 2023-08-20 DIAGNOSIS — A419 Sepsis, unspecified organism: Secondary | ICD-10-CM | POA: Diagnosis not present

## 2023-08-20 DIAGNOSIS — D638 Anemia in other chronic diseases classified elsewhere: Secondary | ICD-10-CM | POA: Diagnosis present

## 2023-08-20 DIAGNOSIS — G319 Degenerative disease of nervous system, unspecified: Secondary | ICD-10-CM | POA: Diagnosis not present

## 2023-08-20 DIAGNOSIS — R9389 Abnormal findings on diagnostic imaging of other specified body structures: Secondary | ICD-10-CM | POA: Diagnosis not present

## 2023-08-20 DIAGNOSIS — E876 Hypokalemia: Secondary | ICD-10-CM

## 2023-08-20 DIAGNOSIS — K922 Gastrointestinal hemorrhage, unspecified: Secondary | ICD-10-CM

## 2023-08-20 DIAGNOSIS — D649 Anemia, unspecified: Secondary | ICD-10-CM

## 2023-08-20 DIAGNOSIS — Z7401 Bed confinement status: Secondary | ICD-10-CM | POA: Diagnosis not present

## 2023-08-20 DIAGNOSIS — A4101 Sepsis due to Methicillin susceptible Staphylococcus aureus: Secondary | ICD-10-CM | POA: Diagnosis present

## 2023-08-20 DIAGNOSIS — S92342A Displaced fracture of fourth metatarsal bone, left foot, initial encounter for closed fracture: Secondary | ICD-10-CM | POA: Diagnosis not present

## 2023-08-20 DIAGNOSIS — L89310 Pressure ulcer of right buttock, unstageable: Secondary | ICD-10-CM | POA: Diagnosis present

## 2023-08-20 DIAGNOSIS — R64 Cachexia: Secondary | ICD-10-CM | POA: Diagnosis present

## 2023-08-20 DIAGNOSIS — L89893 Pressure ulcer of other site, stage 3: Secondary | ICD-10-CM | POA: Diagnosis present

## 2023-08-20 DIAGNOSIS — L89132 Pressure ulcer of right lower back, stage 2: Secondary | ICD-10-CM | POA: Diagnosis present

## 2023-08-20 DIAGNOSIS — K029 Dental caries, unspecified: Secondary | ICD-10-CM | POA: Diagnosis not present

## 2023-08-20 DIAGNOSIS — F418 Other specified anxiety disorders: Secondary | ICD-10-CM | POA: Diagnosis not present

## 2023-08-20 DIAGNOSIS — Z452 Encounter for adjustment and management of vascular access device: Secondary | ICD-10-CM | POA: Diagnosis not present

## 2023-08-20 DIAGNOSIS — D500 Iron deficiency anemia secondary to blood loss (chronic): Secondary | ICD-10-CM | POA: Diagnosis present

## 2023-08-20 DIAGNOSIS — K449 Diaphragmatic hernia without obstruction or gangrene: Secondary | ICD-10-CM | POA: Diagnosis not present

## 2023-08-20 DIAGNOSIS — L89890 Pressure ulcer of other site, unstageable: Secondary | ICD-10-CM | POA: Diagnosis present

## 2023-08-20 DIAGNOSIS — L8996 Pressure-induced deep tissue damage of unspecified site: Secondary | ICD-10-CM | POA: Diagnosis not present

## 2023-08-20 DIAGNOSIS — L89894 Pressure ulcer of other site, stage 4: Secondary | ICD-10-CM | POA: Diagnosis present

## 2023-08-20 DIAGNOSIS — R532 Functional quadriplegia: Secondary | ICD-10-CM | POA: Diagnosis present

## 2023-08-20 DIAGNOSIS — Z4682 Encounter for fitting and adjustment of non-vascular catheter: Secondary | ICD-10-CM | POA: Diagnosis not present

## 2023-08-20 DIAGNOSIS — G808 Other cerebral palsy: Secondary | ICD-10-CM | POA: Diagnosis not present

## 2023-08-20 DIAGNOSIS — E43 Unspecified severe protein-calorie malnutrition: Secondary | ICD-10-CM | POA: Diagnosis present

## 2023-08-20 LAB — GLUCOSE, CAPILLARY
Glucose-Capillary: 102 mg/dL — ABNORMAL HIGH (ref 70–99)
Glucose-Capillary: 77 mg/dL (ref 70–99)
Glucose-Capillary: 62 mg/dL — ABNORMAL LOW (ref 70–99)
Glucose-Capillary: 70 mg/dL (ref 70–99)
Glucose-Capillary: 76 mg/dL (ref 70–99)

## 2023-08-20 LAB — COMPREHENSIVE METABOLIC PANEL
ALT: 21 U/L (ref 0–44)
AST: 23 U/L (ref 15–41)
Albumin: <1.5 g/dL — ABNORMAL LOW (ref 3.5–5.0)
Alkaline Phosphatase: 75 U/L (ref 38–126)
Anion gap: 6 (ref 5–15)
BUN: 12 mg/dL (ref 6–20)
CO2: 24 mmol/L (ref 22–32)
Calcium: 7.1 mg/dL — ABNORMAL LOW (ref 8.9–10.3)
Chloride: 104 mmol/L (ref 98–111)
Creatinine, Ser: 0.39 mg/dL — ABNORMAL LOW (ref 0.61–1.24)
GFR, Estimated: >60 mL/min (ref >60)
Glucose, Bld: 109 mg/dL — ABNORMAL HIGH (ref 70–99)
Potassium: 2.4 mmol/L — CL (ref 3.5–5.1)
Sodium: 134 mmol/L — ABNORMAL LOW (ref 135–145)
Total Bilirubin: 1.3 mg/dL — ABNORMAL HIGH (ref 0.3–1.2)
Total Protein: 6.2 g/dL — ABNORMAL LOW (ref 6.5–8.1)

## 2023-08-20 LAB — BLOOD CULTURE ID PANEL (REFLEXED) - BCID2

## 2023-08-20 LAB — URINALYSIS, W/ REFLEX TO CULTURE (INFECTION SUSPECTED)
Bacteria, UA: NONE SEEN
Bilirubin Urine: NEGATIVE
Glucose, UA: NEGATIVE mg/dL
Hgb urine dipstick: NEGATIVE
Ketones, ur: NEGATIVE mg/dL
Leukocytes,Ua: NEGATIVE
Nitrite: NEGATIVE
Protein, ur: NEGATIVE mg/dL
Specific Gravity, Urine: 1.011 (ref 1.005–1.030)
pH: 6 (ref 5.0–8.0)

## 2023-08-20 LAB — CBC WITH DIFFERENTIAL/PLATELET
Abs Immature Granulocytes: 0.14 10*3/uL — ABNORMAL HIGH (ref 0.00–0.07)
Abs Immature Granulocytes: 0.17 10*3/uL — ABNORMAL HIGH (ref 0.00–0.07)
Basophils Absolute: 0 10*3/uL (ref 0.0–0.1)
Basophils Absolute: 0.1 10*3/uL (ref 0.0–0.1)
Basophils Relative: 0 %
Basophils Relative: 0 %
Eosinophils Absolute: 0.3 10*3/uL (ref 0.0–0.5)
Eosinophils Absolute: 0.4 10*3/uL (ref 0.0–0.5)
Eosinophils Relative: 1 %
Eosinophils Relative: 2 %
HCT: 28.3 % — ABNORMAL LOW (ref 39.0–52.0)
HCT: 30.1 % — ABNORMAL LOW (ref 39.0–52.0)
Hemoglobin: 8.5 g/dL — ABNORMAL LOW (ref 13.0–17.0)
Hemoglobin: 9.1 g/dL — ABNORMAL LOW (ref 13.0–17.0)
Immature Granulocytes: 1 %
Immature Granulocytes: 1 %
Lymphocytes Relative: 9 %
Lymphocytes Relative: 9 %
Lymphs Abs: 1.6 10*3/uL (ref 0.7–4.0)
Lymphs Abs: 1.9 10*3/uL (ref 0.7–4.0)
MCH: 21.5 pg — ABNORMAL LOW (ref 26.0–34.0)
MCH: 21.2 pg — ABNORMAL LOW (ref 26.0–34.0)
MCHC: 30 g/dL (ref 30.0–36.0)
MCHC: 30.2 g/dL (ref 30.0–36.0)
MCV: 71.6 fL — ABNORMAL LOW (ref 80.0–100.0)
MCV: 70.2 fL — ABNORMAL LOW (ref 80.0–100.0)
Monocytes Absolute: 0.9 10*3/uL (ref 0.1–1.0)
Monocytes Absolute: 1.1 10*3/uL — ABNORMAL HIGH (ref 0.1–1.0)
Monocytes Relative: 5 %
Monocytes Relative: 5 %
Neutro Abs: 16.1 10*3/uL — ABNORMAL HIGH (ref 1.7–7.7)
Neutro Abs: 16.6 10*3/uL — ABNORMAL HIGH (ref 1.7–7.7)
Neutrophils Relative %: 84 %
Neutrophils Relative %: 83 %
Platelets: 434 10*3/uL — ABNORMAL HIGH (ref 150–400)
Platelets: 450 10*3/uL — ABNORMAL HIGH (ref 150–400)
RBC: 3.95 MIL/uL — ABNORMAL LOW (ref 4.22–5.81)
RBC: 4.29 MIL/uL (ref 4.22–5.81)
RDW: 28.8 % — ABNORMAL HIGH (ref 11.5–15.5)
RDW: 29 % — ABNORMAL HIGH (ref 11.5–15.5)
WBC: 19.1 10*3/uL — ABNORMAL HIGH (ref 4.0–10.5)
WBC: 20.1 10*3/uL — ABNORMAL HIGH (ref 4.0–10.5)
nRBC: 0.9 % — ABNORMAL HIGH (ref 0.0–0.2)
nRBC: 0.9 % — ABNORMAL HIGH (ref 0.0–0.2)

## 2023-08-20 LAB — AMMONIA: Ammonia: 21 umol/L (ref 9–35)

## 2023-08-20 LAB — POTASSIUM: Potassium: 2.9 mmol/L — ABNORMAL LOW (ref 3.5–5.1)

## 2023-08-20 LAB — BASIC METABOLIC PANEL
Anion gap: 8 (ref 5–15)
BUN: 9 mg/dL (ref 6–20)
CO2: 24 mmol/L (ref 22–32)
Calcium: 7.7 mg/dL — ABNORMAL LOW (ref 8.9–10.3)
Chloride: 105 mmol/L (ref 98–111)
Creatinine, Ser: 0.46 mg/dL — ABNORMAL LOW (ref 0.61–1.24)
GFR, Estimated: >60 mL/min (ref >60)
Glucose, Bld: 92 mg/dL (ref 70–99)
Potassium: 2.5 mmol/L — CL (ref 3.5–5.1)
Sodium: 137 mmol/L (ref 135–145)

## 2023-08-20 LAB — HEMOGLOBIN A1C
Hgb A1c MFr Bld: 5.4 % (ref 4.8–5.6)
Mean Plasma Glucose: 108.28 mg/dL

## 2023-08-20 LAB — LACTIC ACID, PLASMA: Lactic Acid, Venous: 0.8 mmol/L (ref 0.5–1.9)

## 2023-08-20 LAB — HIV ANTIBODY (ROUTINE TESTING W REFLEX): HIV Screen 4th Generation wRfx: NONREACTIVE

## 2023-08-20 LAB — CBG MONITORING, ED: Glucose-Capillary: 113 mg/dL — ABNORMAL HIGH (ref 70–99)

## 2023-08-20 LAB — MRSA NEXT GEN BY PCR, NASAL: MRSA by PCR Next Gen: NOT DETECTED

## 2023-08-20 LAB — PROTIME-INR
INR: 1.6 — ABNORMAL HIGH (ref 0.8–1.2)
Prothrombin Time: 18.9 seconds — ABNORMAL HIGH (ref 11.4–15.2)

## 2023-08-20 LAB — PROCALCITONIN: Procalcitonin: 0.18 ng/mL

## 2023-08-20 MED ORDER — POTASSIUM CHLORIDE 10 MEQ/50ML IV SOLN
INTRAVENOUS | Status: AC
  Filled 2023-08-20: qty 50

## 2023-08-20 MED ORDER — CHLORHEXIDINE GLUCONATE CLOTH 2 % EX PADS
6.0000 | MEDICATED_PAD | Freq: Every day | CUTANEOUS | Status: DC
  Administered 2023-08-20 – 2023-08-24 (×5): 6 via TOPICAL

## 2023-08-20 MED ORDER — CALCIUM GLUCONATE-NACL 1-0.675 GM/50ML-% IV SOLN
1.0000 g | Freq: Once | INTRAVENOUS | Status: AC
  Administered 2023-08-20: 1000 mg via INTRAVENOUS

## 2023-08-20 MED ORDER — POLYETHYLENE GLYCOL 3350 17 G PO PACK: 17.0000 g | PACK | Freq: Every day | ORAL | Status: DC | PRN

## 2023-08-20 MED ORDER — PIPERACILLIN-TAZOBACTAM 3.375 G IVPB
3.3750 g | Freq: Three times a day (TID) | INTRAVENOUS | Status: DC
  Administered 2023-08-20 – 2023-08-21 (×3): 3.375 g via INTRAVENOUS
  Filled 2023-08-20 (×3): qty 50

## 2023-08-20 MED ORDER — PANTOPRAZOLE SODIUM 40 MG IV SOLR: 40.0000 mg | Freq: Two times a day (BID) | INTRAVENOUS | Status: DC

## 2023-08-20 MED ORDER — MEDIHONEY WOUND/BURN DRESSING EX PSTE
1.0000 | PASTE | Freq: Every day | CUTANEOUS | Status: DC
  Administered 2023-08-21 – 2023-08-24 (×3): 1 via TOPICAL
  Filled 2023-08-20 (×4): qty 44

## 2023-08-20 MED ORDER — DEXTROSE IN LACTATED RINGERS 5 % IV SOLN: INTRAVENOUS | Status: DC

## 2023-08-20 MED ORDER — PANTOPRAZOLE SODIUM 40 MG IV SOLR
40.0000 mg | Freq: Two times a day (BID) | INTRAVENOUS | Status: DC
  Administered 2023-08-20: 40 mg via INTRAVENOUS
  Filled 2023-08-20: qty 10

## 2023-08-20 MED ORDER — PANTOPRAZOLE 80MG IVPB - SIMPLE MED
80.0000 mg | Freq: Once | INTRAVENOUS | Status: AC
  Administered 2023-08-20: 80 mg via INTRAVENOUS
  Filled 2023-08-20: qty 100

## 2023-08-20 MED ORDER — ALBUMIN HUMAN 25 % IV SOLN
25.0000 g | Freq: Four times a day (QID) | INTRAVENOUS | Status: AC
  Administered 2023-08-20 – 2023-08-21 (×4): 25 g via INTRAVENOUS
  Filled 2023-08-20 (×4): qty 100

## 2023-08-20 MED ORDER — ORAL CARE MOUTH RINSE: 15.0000 mL | OROMUCOSAL | Status: DC | PRN

## 2023-08-20 MED ORDER — LORAZEPAM 2 MG/ML IJ SOLN: 2.0000 mg | INTRAMUSCULAR | Status: DC | PRN

## 2023-08-20 MED ORDER — POTASSIUM CHLORIDE 10 MEQ/50ML IV SOLN
10.0000 meq | INTRAVENOUS | Status: AC
  Administered 2023-08-20 (×6): 10 meq via INTRAVENOUS
  Filled 2023-08-20: qty 50

## 2023-08-20 MED ORDER — INSULIN ASPART 100 UNIT/ML IJ SOLN: 0.0000 [IU] | INTRAMUSCULAR | Status: DC

## 2023-08-20 MED ORDER — POTASSIUM CHLORIDE 10 MEQ/50ML IV SOLN
10.0000 meq | INTRAVENOUS | Status: AC
  Administered 2023-08-20 – 2023-08-21 (×8): 10 meq via INTRAVENOUS
  Filled 2023-08-20 (×8): qty 50

## 2023-08-20 MED ORDER — CALCIUM CHLORIDE 10 % IV SOLN: 1.0000 g | Freq: Once | INTRAVENOUS | Status: DC

## 2023-08-20 MED ORDER — ORAL CARE MOUTH RINSE
15.0000 mL | OROMUCOSAL | Status: DC
  Administered 2023-08-20 – 2023-08-24 (×12): 15 mL via OROMUCOSAL

## 2023-08-20 MED ORDER — SODIUM CHLORIDE 0.9 % IV SOLN: 1.0000 g | Freq: Once | INTRAVENOUS | Status: DC

## 2023-08-20 MED ORDER — DOCUSATE SODIUM 100 MG PO CAPS: 100.0000 mg | ORAL_CAPSULE | Freq: Two times a day (BID) | ORAL | Status: DC | PRN

## 2023-08-20 MED ORDER — PANTOPRAZOLE INFUSION (NEW) - SIMPLE MED
8.0000 mg/h | INTRAVENOUS | Status: DC
  Administered 2023-08-20 – 2023-08-21 (×3): 8 mg/h via INTRAVENOUS
  Filled 2023-08-20 (×3): qty 100

## 2023-08-20 NOTE — ED Notes (Signed)
Patients belongings left at [facility] ED. Patients belongings locked up in psych locker #2. Primary nurse notified.

## 2023-08-20 NOTE — ED Provider Notes (Signed)
Physical Exam  BP 101/66   Pulse 86   Temp 98.7 F (37.1 C) (Bladder)   Resp 17   Ht 5' (1.524 m)   Wt 47.7 kg   SpO2 98%   BMI 20.54 kg/m   Physical Exam Constitutional:      General: He is not in acute distress.    Appearance: Normal appearance. He is ill-appearing.  HENT:     Head: Normocephalic and atraumatic.     Nose: No congestion or rhinorrhea.  Eyes:     General:        Right eye: No discharge.        Left eye: No discharge.     Extraocular Movements: Extraocular movements intact.     Pupils: Pupils are equal, round, and reactive to light.  Cardiovascular:     Rate and Rhythm: Normal rate and regular rhythm.     Heart sounds: No murmur heard. Pulmonary:     Effort: No respiratory distress.     Breath sounds: No wheezing or rales.  Abdominal:     General: There is no distension.     Tenderness: There is no abdominal tenderness.  Musculoskeletal:        General: Normal range of motion.     Cervical back: Normal range of motion.  Skin:    General: Skin is warm and dry.     Findings: Lesion present.  Neurological:     General: No focal deficit present.     Mental Status: He is alert.     Procedures  .Critical Care  Performed by: Glendora Score, MD Authorized by: Glendora Score, MD   Critical care provider statement:    Critical care time (minutes):  30   Critical care was necessary to treat or prevent imminent or life-threatening deterioration of the following conditions:  Shock   Critical care was time spent personally by me on the following activities:  Development of treatment plan with patient or surrogate, discussions with consultants, evaluation of patient's response to treatment, examination of patient, ordering and review of laboratory studies, ordering and review of radiographic studies, ordering and performing treatments and interventions, pulse oximetry, re-evaluation of patient's condition and review of old charts .Central Line  Date/Time:  08/20/2023 12:45 AM  Performed by: Glendora Score, MD Authorized by: Glendora Score, MD   Consent:    Consent obtained:  Emergent situation Sedation:    Sedation type:  None Anesthesia:    Anesthesia method:  None Procedure details:    Location:  L internal jugular   Procedural supplies:  Triple lumen   Ultrasound guidance: yes     Ultrasound guidance timing: real time     Number of attempts:  1   Successful placement: yes   Post-procedure details:    Post-procedure:  Line sutured and dressing applied   Assessment:  Blood return through all ports   Procedure completion:  Tolerated well, no immediate complications   ED Course / MDM    Medical Decision Making Amount and/or Complexity of Data Reviewed Labs: ordered. Radiology: ordered.  Risk Prescription drug management. Decision regarding hospitalization.   Patient received in handoff.  Concern for sepsis with source likely multiple cutaneous wounds.  Also with significant anemia.  Pending CT chest and pelvis at time of signout.  Patient's current access is a right sided EJ, small 22 in the hand on the left.  Patient requiring peripheral pressors and blood and I placed a left-sided IJ CVC.  CT with no  acute pathology in the chest abdomen or pelvis.  Patient admitted to the ICU with accepting doctor Dr. Jackquline Berlin, Wyn Forster, MD 08/20/23 [phone]

## 2023-08-20 NOTE — Consult Note (Addendum)
Consultation  Referring Provider: Dr. Katrinka Blazing    Primary Care Physician:  Benetta Spar, MD Primary Gastroenterologist:   [facility] gastroenterology (unassigned here)      Reason for Consultation:    Upper GI bleed and sepsis         HPI:   Ian Hurley is a 57 y.o. male with a past medical history as listed below including atrophic gastritis, cerebral palsy with functional quadriplegia, duodenitis and erosive esophagitis, who initially presented to [facility] with aphasia.  Code stroke was called but imaging did not show anything acute.  Hemoglobin noted to be 4 and blood pressures low, started on antibiotics and given blood with improved mental status now.  We are called in regards to reports of hematemesis.    At time of my interview with the patient he is unable to tell me any of his history.  Per nursing staff he previously was able to verbalize that he may have had some episodes of hematemesis and there is certainly what looks like bloody residue on his teeth.  None since arrival at the hospital.  Though per nursing staff his mental status has improved.  He denies any abdominal pain.  GI history: 05/24/2014 colonoscopy by Dr. Jena Gauss with single rectal polyp 05/23/2014 flex sigmoidoscopy with formed stool in the colon and a large amount of liquid stool 05/19/2014 upper endoscopy with severe ulcerative esophagitis contributing to microcytic anemia, large hiatal hernia and mild nonerosive gastritis as well as small gastric polyp  Past Medical History:  Diagnosis Date   Anemia    Anxiety    Atrophic gastritis MAY 2015   HB 6.6 MCV 63 FERRITIN 6   Cerebral palsy (HCC)    Depression    Duodenitis MAY 2015   HB 6.6 MCV 63 FERRITIN 6   Erosive esophagitis MAY 2015   HB 6.6 MCV 63 FERRITIN 6   Polycythemia 05/17/2015   Seizures (HCC)     Past Surgical History:  Procedure Laterality Date   COLONOSCOPY N/A 05/24/2014   MVH:QIONGE rectal polyp otherwise normal    ESOPHAGOGASTRODUODENOSCOPY N/A 05/23/2014   XBM:WUXLK HH/mild non-erosive gastritis/small gastric polyp   FLEXIBLE SIGMOIDOSCOPY N/A 05/23/2014   SLF: formed stool in colon and large amount of liquid stool    History reviewed. No pertinent family history.   Social History   Tobacco Use   Smoking status: Former    Current packs/day: 0.00    Types: Cigarettes    Quit date: 01/16/2014    Years since quitting: 9.5   Smokeless tobacco: Never   Tobacco comments:    Former smoker  Substance Use Topics   Alcohol use: No    Alcohol/week: 0.0 standard drinks of alcohol   Drug use: No    Prior to Admission medications   Medication Sig Start Date End Date Taking? Authorizing Provider  albuterol (VENTOLIN HFA) 108 (90 Base) MCG/ACT inhaler Inhale 2 puffs into the lungs every 4 (four) hours as needed for wheezing or shortness of breath.   Yes [provider]  atorvastatin (LIPITOR) 20 MG tablet  01/31/18  Yes [provider]  carvedilol (COREG) 3.125 MG tablet Take 3.125 mg by mouth 2 (two) times daily with a meal. 09/25/16  Yes [provider]  cyclobenzaprine (FLEXERIL) 10 MG tablet Take 10 mg by mouth 3 (three) times daily.   Yes [provider]  docusate sodium (COLACE) 100 MG capsule Take 100 mg by mouth every morning.  Yes [provider]  furosemide (LASIX) 20 MG tablet Take 40 mg by mouth daily. 09/26/16  Yes [provider]  liver oil-zinc oxide (BOUDREAUXS BUTT PASTE) 40 % ointment Apply 1 application topically 2 (two) times daily as needed for irritation.    Yes [provider]  Multiple Vitamins-Minerals (CENTRUM SILVER 50+MEN) TABS Take 1 tablet by mouth daily.   Yes [provider]  pantoprazole (PROTONIX) 40 MG tablet Take 1 tablet (40 mg total) by mouth 2 (two) times daily before a meal. 05/26/14  Yes Fanta, Wayland Salinas, MD  potassium chloride SA (K-DUR,KLOR-CON) 20 MEQ tablet Take 40 mEq by mouth daily.     Yes [provider]  QUEtiapine (SEROQUEL) 50 MG tablet Take 50 mg by mouth 2 (two) times daily.   Yes [provider]  sertraline (ZOLOFT) 100 MG tablet Take 100 mg by mouth daily.   Yes [provider]  traMADol (ULTRAM) 50 MG tablet Take 50 mg by mouth 2 (two) times daily as needed for moderate pain.    Yes [provider]  LORazepam (ATIVAN) 1 MG tablet Take 1 mg by mouth every 8 (eight) hours as needed for anxiety.    [provider]    Current Facility-Administered Medications  Medication Dose Route Frequency Provider Last Rate Last Admin   albumin human 25 % solution 25 g  25 g Intravenous Q6H Lorin Glass, MD       Chlorhexidine Gluconate Cloth 2 % PADS 6 each  6 each Topical Daily Lorin Glass, MD       docusate sodium (COLACE) capsule 100 mg  100 mg Oral BID PRN Lorin Glass, MD       insulin aspart (novoLOG) injection 0-6 Units  0-6 Units Subcutaneous Q4H Lorin Glass, MD       LORazepam (ATIVAN) injection 2 mg  2 mg Intravenous Q4H PRN Lorin Glass, MD       norepinephrine (LEVOPHED) 4mg  in (0.016 mg/mL) premix infusion  0-40 mcg/min Intravenous Titrated Gloris Manchester, MD 41.3 mL/hr at 08/20/23 1000 11 mcg/min at 08/20/23 1000   pantoprazole (PROTONIX) 80 mg /NS 100 mL IVPB  80 mg Intravenous Once Lorin Glass, MD 300 mL/hr at 08/20/23 1157 80 mg at 08/20/23 1157   [START ON 08/23/2023] pantoprazole (PROTONIX) injection 40 mg  40 mg Intravenous Q12H Lorin Glass, MD       pantoprozole (PROTONIX) 80 mg /NS 100 mL infusion  8 mg/hr Intravenous Continuous Lorin Glass, MD       piperacillin-tazobactam (ZOSYN) IVPB 3.375 g  3.375 g Intravenous Q8H Calton Dach I, RPH       polyethylene glycol (MIRALAX / GLYCOLAX) packet 17 g  17 g Oral Daily PRN Lorin Glass, MD       potassium chloride 10 mEq in 50 mL *CENTRAL LINE* IVPB  10 mEq Intravenous Q1 Hr x 6 Lorin Glass, MD       vancomycin (VANCOREADY) IVPB  500 mg/100 mL  500 mg Intravenous Q12H Lucia Gaskins, RPH   Stopped at 08/20/23 1013    Allergies as of 08/19/2023   (No Known Allergies)     Review of Systems:    Unable to complete as patient is unable to speak at time of my interview   Physical Exam:  Vital signs in last 24 hours: Temp:  [96.1 F (35.6 C)-100.3 F (37.9 C)] 99.4 F (37.4 C) (08/20 1015) Pulse Rate:  [  59-96] 59 (08/20 1015) Resp:  [11-29] 17 (08/20 1015) BP: (63-141)/(44-76) 132/75 (08/20 1015) SpO2:  [94 %-100 %] 100 % (08/20 1015) Weight:  [45.8 kg-47.7 kg] 45.8 kg (08/20 1144)   General:   Chronically cachectic ill African-American male appears to be in NAD, alert  Head:  Normocephalic and atraumatic. Eyes:   PEERL, EOMI. No icterus. Conjunctiva pink. Ears:  Normal auditory acuity. Neck:  Supple Throat: Oral cavity and pharynx without inflammation, swelling or lesion.   +Dry with malodorous mouth and brown staining on his teeth Lungs: Respirations even and unlabored. Lungs clear to auscultation bilaterally.   No wheezes, crackles, or rhonchi.  Heart: Normal S1, S2. No MRG. Regular rate and rhythm. Abdomen:  Soft, nondistended, nontender. No rebound or guarding. Normal bowel sounds. No appreciable masses or hepatomegaly. Rectal:  Not performed.  Msk:  Symmetrical without gross deformities. Peripheral pulses intact.  Extremities:  + Contractures Neurologic:  Alert;  grossly normal neurologically.  Skin:   + Extensive skin breakdown and pressure wounds Psychiatric: Unable to speak at time of my interview   LAB RESULTS: Recent Labs    08/19/23 1833 08/20/23 0857  WBC 13.9* 19.1*  HGB 4.5* 8.5*  HCT 19.9* 28.3*  PLT 526* 434*   BMET Recent Labs    08/19/23 1833 08/20/23 0909  NA 135 134*  K 3.9 2.4*  CL 104 104  CO2 24 24  GLUCOSE 92 109*  BUN 16 12  CREATININE 0.55* 0.39*  CALCIUM 7.8* 7.1*   LFT Recent Labs    08/20/23 0909  PROT 6.2*  ALBUMIN <1.5*  AST 23  ALT 21  ALKPHOS 75   BILITOT 1.3*   PT/INR Recent Labs    08/19/23 1833 08/20/23 0857  LABPROT 18.6* 18.9*  INR 1.5* 1.6*    STUDIES: DG Chest Portable 1 View  Result Date: 08/20/2023 CLINICAL DATA:  Check line placement. EXAM: PORTABLE CHEST 1 VIEW COMPARISON:  Chest, abdomen and pelvis CT with contrast yesterday. FINDINGS: 1:03 a.m. Interval left IJ line insertion with tip in the distal SVC. The lungs are clear. There is no visible pneumothorax. Heart size and vasculature are normal. The mediastinum is normally outlined. There is no substantial pleural effusion. There is mild chronic elevation of the right diaphragm. There is a fractured lower thoracic/lumbar spinal fusion rod partially visible. Mild thoracic dextroscoliosis. IMPRESSION: Interval left IJ line insertion with tip in the distal SVC. No visible pneumothorax. No evidence of acute chest process. Electronically Signed   By: Almira Bar M.D.   On: 08/20/2023 01:36   CT CHEST ABDOMEN PELVIS W CONTRAST  Result Date: 08/19/2023 CLINICAL DATA:  Sepsis EXAM: CT CHEST, ABDOMEN, AND PELVIS WITH CONTRAST TECHNIQUE: Multidetector CT imaging of the chest, abdomen and pelvis was performed following the standard protocol during bolus administration of intravenous contrast. RADIATION DOSE REDUCTION: This exam was performed according to the departmental dose-optimization program which includes automated exposure control, adjustment of the mA and/or kV according to patient size and/or use of iterative reconstruction technique. CONTRAST:  75mL OMNIPAQUE IOHEXOL 350 MG/ML SOLN COMPARISON:  None Available. FINDINGS: CT CHEST FINDINGS Cardiovascular: Heart is normal size. Aorta is normal caliber. No filling defects in the pulmonary arteries to suggest pulmonary emboli. Mediastinum/Nodes: No mediastinal, hilar, or axillary adenopathy. Trachea and esophagus are unremarkable. Thyroid unremarkable. Moderate-sized hiatal hernia. Lungs/Pleura: Lungs are clear. No focal airspace  opacities or suspicious nodules. No effusions. Musculoskeletal: Chest wall soft tissues are unremarkable. No acute bony abnormality. CT ABDOMEN PELVIS  FINDINGS Hepatobiliary: Diffuse low-density throughout the liver compatible with fatty infiltration. No focal abnormality. Gallbladder unremarkable. Pancreas: No focal abnormality or ductal dilatation. Spleen: No focal abnormality.  Normal size. Adrenals/Urinary Tract: Foley catheter in the bladder. No renal or adrenal mass. No stones or hydronephrosis. Stomach/Bowel: Moderate stool burden throughout the colon. Stomach, large and small bowel grossly unremarkable. Vascular/Lymphatic: No evidence of aneurysm or adenopathy. Reproductive: No visible focal abnormality. Other: No free fluid or free air. Musculoskeletal: Posterior spinal rod in the thoracolumbar spine. Scoliosis. No acute bony abnormality. IMPRESSION: No acute findings in the chest, abdomen or pelvis. Moderate stool burden throughout the colon. Fatty liver. Moderate-sized hiatal hernia. Electronically Signed   By: Charlett Nose M.D.   On: 08/19/2023 23:48   CT HEAD CODE STROKE WO CONTRAST  Result Date: 08/19/2023 CLINICAL DATA:  Code stroke.  Neuro deficit, acute, stroke suspected EXAM: CT HEAD WITHOUT CONTRAST TECHNIQUE: Contiguous axial images were obtained from the base of the skull through the vertex without intravenous contrast. RADIATION DOSE REDUCTION: This exam was performed according to the departmental dose-optimization program which includes automated exposure control, adjustment of the mA and/or kV according to patient size and/or use of iterative reconstruction technique. COMPARISON:  CT head 12/24/2009. FINDINGS: Motion limited study. 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 No clear evidence of acute large vascular territory infarct or acute hemorrhage. No midline shift or hydrocephalus. Cerebral  atrophy. Vascular: No hyperdense vessel. Skull: No acute fracture. Sinuses/Orbits: Clear sinuses.  No acute orbital findings. Other: No mastoid effusions. ASPECTS [facility] Stroke Program Early CT Score) total score (0-10 with 10 being normal): 10. IMPRESSION: 1. Motion limited study with interval, but remote appearing infarct in the right insula and overlying operculum. An MRI could provide more sensitive evaluation for acute infarct if clinically warranted. 2. Remote left frontal infarct. Code stroke imaging results were communicated on 08/19/2023 at 6:51 pm to provider [facility] via telephone, who verbally acknowledged these results. Electronically Signed   By: Feliberto Harts M.D.   On: 08/19/2023 18:52     Impression / Plan:   Impression: 1.  Sepsis: Found to be in shock state with question of GI hemorrhage and history of esophagitis, reports of hematemesis previously 2.  Aphasia: Element of confusion, question of breakthrough seizure 3.  Functional quadriplegic 4.  Multiple pressure ulcers 5.  Hypokalemia 6.  Peripheral and protein calorie malnutrition 7.  Hematemesis and with acute on chronic anemia: Hemoglobin 4.5--> 8.5 after 3 units PRBCs, description of hematemesis, previously worked up by [facility] with what looks like EGD/colonoscopy and pill capsule endoscopy (cannot see pill capsule endoscopy results), then followed with hematology oncology for polycythemia, looks like their last visit in 2019  Plan: 1.  Agree with potassium repletion 2.  Continue Vanco and Zosyn 3.  Continue PPI infusion for now 4.  Continue to monitor hemoglobin to transfusion as needed less than 7 5.  Eventual EGD, after correction of electrolyte abnormalities and work up of aphasia.  Thank you for your kind consultation, we will continue to follow.  Violet Baldy [facility]  08/20/2023, 12:07 PM  I have taken an interval history, thoroughly reviewed the chart and examined the patient. I agree with the Advanced  Practitioner's note, impression and recommendations, and have recorded additional findings, impressions and recommendations below. I performed a substantive portion of this encounter (>50% time spent), including a complete performance of the medical decision making.  My  additional thoughts are as follows:  He is awake and attentive when I see him, and he follows basic commands like opening his mouth then looking certain directions.  However, any speech he is attempting to produce at this point is unintelligible, and therefore it is not clear to what extent he is aware of the current situation.  Previous endoscopic evaluation for iron deficiency anemia in 2015 at [facility] revealed hiatal hernia and ulcerative esophagitis.  Suspect similar process still occurring leading to his chronic anemia. There is some uncertainty about whether or not he reported some hematemesis, but regardless he needs an upper endoscopy during this hospital admission to evaluate it. He is certainly not having brisk GI bleeding at present and he is also being treated for possible sepsis with broad-spectrum antibiotics as well as blood pressure support with Levophed and PRBC transfusions. Severe hypokalemia being repleted.  Overall, he is not yet ready for endoscopic evaluation.  Also, I think it would be best to contact his family to get consent from them since his mental status is difficult to evaluate at this point.  We will check back tomorrow and see if the following day may be appropriate for an endoscopy.  No urgent need at the present time in my opinion.  [name] Office:[phone]

## 2023-08-20 NOTE — ED Notes (Signed)
Dr. Posey Rea at bedside to insert central line at this time to left neck

## 2023-08-20 NOTE — ED Notes (Signed)
[facility] called at this time and notified of bed ready status at [facility].

## 2023-08-20 NOTE — ED Notes (Signed)
Pt's sister called and requested pt be sent to [facility] instead of [facility]. Informed charge nurse and informed that [facility] is already arranged and on the way. Sister to speak with physician at [facility].

## 2023-08-20 NOTE — Progress Notes (Signed)
   PCCM transfer request    Sending physician: Dr. Audrie Lia  Sending facility: [facility]  Reason for transfer: Septic shock requiring norepinephrine  Brief case summary:  57 year old ported functional quadriplegic presents from skilled nursing facility not acting right, encephalopathic, "would not wake up", found to have a hemoglobin in the fours and hypotension septic shock presumably due to skin and soft tissue wounds.  Getting blood.  Mentation improved.  Norepinephrine requirement greater than 10.  ED physician to place central line.  Recommendations made prior to transfer: -- Administer antibiotics -- MAP greater than 65, once central line placed infuse norepinephrine via central line  Transfer accepted: yes    Ian Hurley 08/20/23 12:29 AM [facility]  For contact information, see Amion. If no response to pager, please call PCCM consult pager. After hours, 7PM- 7AM, please call Elink.

## 2023-08-20 NOTE — ED Notes (Signed)
Sister Clydie Braun updated at this time. Sister request updates on pt's RN informed at [facility].

## 2023-08-20 NOTE — Progress Notes (Signed)
08/20/2023 Apparently patient tapered off phenobarb in 2023 due to no recurrence of seizures. Will not restart.  Appreciate pharmD Madeline Mitchell's help in tracking this down.  Myrla Halsted MD PCCM

## 2023-08-20 NOTE — Progress Notes (Signed)
Pharmacy Antibiotic Note  Ian Hurley is a 57 y.o. male admitted on 08/19/2023 with sepsis pending workup with known wounds.  Pharmacy has been consulted for vancomycin and zosyn dosing.  WBC 19.1, LA 5.4>0.8, Tmax 100.64F   Plan: Zosyn 3.375 g IV q8h  Vancomycin 500 q 12h (eAUC 473)  -f/u renal function, levels at steady state and/or PRN per protocol -f/u cultures and MRSA PCR  Height: 5' (152.4 cm) Weight: 47.7 kg (105 lb 2.6 oz) IBW/kg (Calculated) : 50  Temp (24hrs), Avg:98.4 F (36.9 C), Min:96.1 F (35.6 C), Max:100.3 F (37.9 C)  Recent Labs  Lab 08/19/23 1833 08/19/23 1946 08/19/23 2150 08/20/23 0857 08/20/23 0909  WBC 13.9*  --   --  19.1*  --   CREATININE 0.55*  --   --   --  0.39*  LATICACIDVEN  --  1.8 5.4* 0.8  --     Estimated Creatinine Clearance: 68.7 mL/min (A) (by C-G formula based on SCr of 0.39 mg/dL (L)).    No Known Allergies  Antimicrobials this admission: Vanc 8/19>  Zosyn 8/20>  cefepime 8/19>8/20  Dose adjustments this admission:  Microbiology results: 8/19 BCX:  8/19 Flu, COVID neg  Thank you for allowing pharmacy to be a part of this patient's care.  Calton Dach, PharmD, BCCCP Clinical Pharmacist 08/20/2023 11:36 AM

## 2023-08-20 NOTE — ED Notes (Signed)
Oral care provided. Removed moderate amount of build-up from patient's mouth and teeth. Unable to completely remove due to extreme calculus buildup.

## 2023-08-20 NOTE — Consult Note (Addendum)
WOC Nurse Consult Note: Reason for Consult: Consult requested for multiple wounds.  Pt is emaciated and contracted and immobile and critically ill with multiple systemic factors which can impair healing.  Left buttock Unstageable pressure injury 80% slough/eschar, 20% red, 4X7cm Left ischium Unstageable pressure injury; 3X5cm, 50% red, 50% slough/eschar Left anterior calf full thickness wound 90% slough/eschar with exposed tendon, 6X3X1cm Right ischium Unstageable pressure injury 7X9cm, 100% tightly adhered eschar Right elbow Unstageable pressure injury 3X3X.3cm, 50% red, 50% yellow Right posterior thigh Unstageable pressure injury; 3X3cm, tightly adhered eschar Right outer foot full thickness wound 2X2X.2cm, red and moist Pressure Injury POA: Yes If aggressive plan of care is desired, please consult surgical team for possible debridement of Unstageable pressure injuries to right ischium and right posterior thigh. Dressing procedure/placement/frequency: Pt is on a low airloss mattress to reduce pressure.  Topical treatment orders provided for bedside nurses to perform as follows to assit with removal of nonviable tissue: Apply Medihoney to left and right buttocks, left and right ischium, left anterior calf, right elbow, right posterior thigh Q day, then cover with foam dressings.  Change foam dressings Q 3 days or PRN soiling Please re-consult if further assistance is needed.  Thank-you,  Cammie Mcgee MSN, RN, CWOCN, [place], CNS [phone]

## 2023-08-20 NOTE — ED Notes (Signed)
Pt has appeared to urinated around his urinary cathter, bed linen and incontinent pads soaked with yellow urine, urinary cathter intact and secured properly, remains inserted. Complete bed linen change, new incontinent pads applied under pt and clean brief applied. Pt also repositioned in bed for comfort.

## 2023-08-20 NOTE — ED Notes (Signed)
[facility] arrived to transport pt and requested repeat CMP and calcium chloride prior to transport. EDP notified. Calcium chloride not available in ED. Pharmacy notified and calcium gluconate ordered d/t albumin levels correlating with calcium levels per pharmacy.

## 2023-08-20 NOTE — Progress Notes (Signed)
eLink Physician-Brief Progress Note Patient Name: Ian Hurley DOB: [birth year] MRN: [id number]   Date of Service  08/20/2023  HPI/Events of Note  I attempted to call patient's sister unsuccessfully. Call went unanswered.  eICU Interventions          Migdalia Dk 08/20/2023, 11:30 PM

## 2023-08-20 NOTE — Progress Notes (Signed)
Hold MRI for now per Dr. Katrinka Blazing

## 2023-08-20 NOTE — Progress Notes (Signed)
Elink notified of patient's sister requesting an update. Patient's sister states she "has been waiting all day for an update". Request relayed to Elink to fulfill sisters request. Contact information verified and is accurate.

## 2023-08-20 NOTE — Evaluation (Signed)
Clinical/Bedside Swallow Evaluation Patient Details  Name: Ian Hurley MRN: [id number] Date of Birth: 01/12/1966  Today's Date: 08/20/2023 Time: SLP Start Time (ACUTE ONLY): 1235 SLP Stop Time (ACUTE ONLY): 1247 SLP Time Calculation (min) (ACUTE ONLY): 12 min  Past Medical History:  Past Medical History:  Diagnosis Date   Anemia    Anxiety    Atrophic gastritis MAY 2015   HB 6.6 MCV 63 FERRITIN 6   Cerebral palsy (HCC)    Depression    Duodenitis MAY 2015   HB 6.6 MCV 63 FERRITIN 6   Erosive esophagitis MAY 2015   HB 6.6 MCV 63 FERRITIN 6   Polycythemia 05/17/2015   Seizures (HCC)    Past Surgical History:  Past Surgical History:  Procedure Laterality Date   COLONOSCOPY N/A 05/24/2014   UXL:KGMWNU rectal polyp otherwise normal   ESOPHAGOGASTRODUODENOSCOPY N/A 05/23/2014   UVO:ZDGUY HH/mild non-erosive gastritis/small gastric polyp   FLEXIBLE SIGMOIDOSCOPY N/A 05/23/2014   SLF: formed stool in colon and large amount of liquid stool   HPI:  57 year old man who presented to [facility] with for aphasia. Stroke work up negative. Pt w/ hx of cerebral palsy functional quadriplegic, prior erosive gastritis, and chronic pressure injuries.    Assessment / Plan / Recommendation  Clinical Impression  Pt seen for clinical swallowing evaluation which was limited by pt reduced bolus acceptance. Pt alert and with hypophonic vocal quality which improved with PO intake. Xerostomia and very poor oral hygiene appreciated. Oral care attempted with limited success as pt biting down on toothette. Pt required total assist for repositioning as pt with preferred neck extension and R lateral lean. Pt presents with s/sx at least a moderate oral dysphagia c/b oral holding and delayed oral transit as well as mild oral residual. Residual appeared with purees and reduced with liquid wash. Additionally, pt with inconsistent ability to siphon liquids from straw. Oral deficits due to suspected lingual and oral  weakness/incoorindination Concern for possible pharyngeal dysphagia given inconsistent secondary swallows with thin liquids. No other overt s/sx pharyngeal dysphagia with additional trials. Recommed cautious initiation of a puree diet with thin liquids with safe swallowing strategies/aspiration precautions as outlined below. SLP to f/u per for diet tolerance and trials of upgraded textures.  SLP Visit Diagnosis: Dysphagia, oropharyngeal phase (R13.12)    Aspiration Risk  Moderate aspiration risk;Risk for inadequate nutrition/hydration    Diet Recommendation Dysphagia 1 (Puree);Thin liquid    Liquid Administration via: Spoon;Straw Medication Administration: Crushed with puree Supervision: Staff to assist with self feeding;Full supervision/cueing for compensatory strategies Compensations: Minimize environmental distractions;Slow rate;Small sips/bites;Follow solids with liquid    Other  Recommendations Recommended Consults:  (RD consult) Oral Care Recommendations: Oral care QID;Staff/trained caregiver to provide oral care    Recommendations for follow up therapy are one component of a multi-disciplinary discharge planning process, led by the attending physician.  Recommendations may be updated based on patient status, additional functional criteria and insurance authorization.  Follow up Recommendations  (TBD)         Functional Status Assessment Patient has had a recent decline in their functional status and demonstrates the ability to make significant improvements in function in a reasonable and predictable amount of time.  Frequency and Duration min 2x/week  2 weeks       Prognosis Prognosis for improved oropharyngeal function: Fair Barriers to Reach Goals: Severity of deficits      Swallow Study   General Date of Onset: 08/19/23 HPI: 57 year old man  who presented to [facility] with for aphasia. Stroke work up negative. Pt w/ hx of cerebral palsy functional quadriplegic, prior erosive  gastritis, and chronic pressure injuries. Type of Study: Bedside Swallow Evaluation Previous Swallow Assessment: none Diet Prior to this Study: Regular;Thin liquids (Level 0) Temperature Spikes Noted: Yes Respiratory Status: Room air History of Recent Intubation: No Behavior/Cognition: Alert;Requires cueing;Doesn't follow directions Oral Cavity Assessment: Dried secretions;Dry Oral Care Completed by SLP: Yes Oral Cavity - Dentition: Poor condition;Missing dentition Vision:  (does not feed self) Self-Feeding Abilities: Total assist Patient Positioning: Upright in bed Baseline Vocal Quality: Aphonic;Low vocal intensity;Hoarse Volitional Cough: Cognitively unable to elicit Volitional Swallow: Unable to elicit    Oral/Motor/Sensory Function Overall Oral Motor/Sensory Function:  (unable to assess)   Ice Chips Ice chips: Impaired Oral Phase Impairments: Reduced lingual movement/coordination Oral Phase Functional Implications: Prolonged oral transit;Oral holding Pharyngeal Phase Impairments:  (appeared WFL)   Thin Liquid Thin Liquid: Impaired Presentation: Spoon;Straw Oral Phase Impairments: Reduced labial seal Oral Phase Functional Implications:  (inconsistent ability to siphon liquids from straw) Pharyngeal  Phase Impairments: Multiple swallows (occasional secondary swallow)    Nectar Thick Nectar Thick Liquid: Not tested   Honey Thick Honey Thick Liquid: Not tested   Puree Puree: Impaired Oral Phase Impairments: Reduced lingual movement/coordination;Poor awareness of bolus Oral Phase Functional Implications: Prolonged oral transit;Oral residue Pharyngeal Phase Impairments:  (appeared [facility])   Solid     Solid: Not tested     Ian Hurley, M.S., CCC-SLP Speech-Language Pathologist Secure Chat Preferred  O: [phone]  Ian Hurley 08/20/2023,1:04 PM

## 2023-08-20 NOTE — Progress Notes (Signed)
eLink Physician-Brief Progress Note Patient Name: Ian Hurley DOB: [birth year] MRN: [id number]   Date of Service  08/20/2023  HPI/Events of Note  Low blood glucose.  eICU Interventions  D 5 LR  gtt at 75 ml / hour.        Thomasene Lot Needham Biggins 08/20/2023, 9:22 PM

## 2023-08-20 NOTE — Progress Notes (Signed)
PHARMACY - PHYSICIAN COMMUNICATION CRITICAL VALUE ALERT - BLOOD CULTURE IDENTIFICATION (BCID)  Ian Hurley is an 57 y.o. male who presented to [facility] on 08/19/2023 with a chief complaint of slurred speech.  Started on broad spectrum antibiotics for sepsis with multiple wounds.  Assessment:  BCx growing MSSA in 2 of 3 bottles.  Name of physician (or Provider) Contacted: Dr. Katrinka Blazing  Current antibiotics: vanc and Zosyn  Changes to prescribed antibiotics recommended:  Recommendations declined by provider due to multiple possible sources of infection F/u ID consult, narrow as able  Results for orders placed or performed during the hospital encounter of 08/19/23  Blood Culture ID Panel (Reflexed) (Collected: 08/19/2023  8:05 PM)  Result Value Ref Range   Enterococcus faecalis NOT DETECTED NOT DETECTED   Enterococcus Faecium NOT DETECTED NOT DETECTED   Listeria monocytogenes NOT DETECTED NOT DETECTED   Staphylococcus species DETECTED (A) NOT DETECTED   Staphylococcus aureus (BCID) DETECTED (A) NOT DETECTED   Staphylococcus epidermidis NOT DETECTED NOT DETECTED   Staphylococcus lugdunensis NOT DETECTED NOT DETECTED   Streptococcus species NOT DETECTED NOT DETECTED   Streptococcus agalactiae NOT DETECTED NOT DETECTED   Streptococcus pneumoniae NOT DETECTED NOT DETECTED   Streptococcus pyogenes NOT DETECTED NOT DETECTED   A.calcoaceticus-baumannii NOT DETECTED NOT DETECTED   Bacteroides fragilis NOT DETECTED NOT DETECTED   Enterobacterales NOT DETECTED NOT DETECTED   Enterobacter cloacae complex NOT DETECTED NOT DETECTED   Escherichia coli NOT DETECTED NOT DETECTED   Klebsiella aerogenes NOT DETECTED NOT DETECTED   Klebsiella oxytoca NOT DETECTED NOT DETECTED   Klebsiella pneumoniae NOT DETECTED NOT DETECTED   Proteus species NOT DETECTED NOT DETECTED   Salmonella species NOT DETECTED NOT DETECTED   Serratia marcescens NOT DETECTED NOT DETECTED   Haemophilus influenzae NOT  DETECTED NOT DETECTED   Neisseria meningitidis NOT DETECTED NOT DETECTED   Pseudomonas aeruginosa NOT DETECTED NOT DETECTED   Stenotrophomonas maltophilia NOT DETECTED NOT DETECTED   Candida albicans NOT DETECTED NOT DETECTED   Candida auris NOT DETECTED NOT DETECTED   Candida glabrata NOT DETECTED NOT DETECTED   Candida krusei NOT DETECTED NOT DETECTED   Candida parapsilosis NOT DETECTED NOT DETECTED   Candida tropicalis NOT DETECTED NOT DETECTED   Cryptococcus neoformans/gattii NOT DETECTED NOT DETECTED   Meth resistant mecA/C and MREJ NOT DETECTED NOT DETECTED    Kimyata Milich D. Laney Potash, PharmD, BCPS, BCCCP 08/20/2023, 5:21 PM

## 2023-08-20 NOTE — ED Notes (Signed)
Transfusion completed. Vitals WNL.

## 2023-08-20 NOTE — H&P (Signed)
NAME:  Ian Hurley, MRN:  [id number], DOB:  Oct 26, 1966, LOS: 0 ADMISSION DATE:  08/19/2023, CONSULTATION DATE:  08/20/23 REFERRING MD:  [facility], CHIEF COMPLAINT:  AMS   History of Present Illness:  57 year old man w/ hx of cerebral palsy functional quadriplegic, prior erosive gastritis, chronic pressure injuries presenting with aphasia to [facility].  Initially a code stroke but imaging without anything acute.  Hgb noted to be 4 and Bps low.  Started on abx and given blood now mental status improved.  Patient admits he has been throwing up blood lately (he said yes to this initially then was more equivocal about it on subsequent asks).  His aphasia seems to be also partially explained by his extremely dry mouth.  Pertinent  Medical History  CP with functional quadriplegia, chronic contractures, and pressure ulcers Esophagitis Seizures  Significant Hospital Events: Including procedures, antibiotic start and stop dates in addition to other pertinent events   08/20/23  Interim History / Subjective:  Seen in ICU Levo at 11  Objective   Blood pressure 132/75, pulse (!) 59, temperature 99.4 F (37.4 C), resp. rate 17, height 5' (1.524 m), weight 47.7 kg, SpO2 100%.        Intake/Output Summary (Last 24 hours) at 08/20/2023 1140 Last data filed at 08/20/2023 1013 Gross per 24 hour  Intake 7387.17 ml  Output 850 ml  Net 6537.17 ml   Filed Weights   08/19/23 1934  Weight: 47.7 kg    Examination: General: chronically cachetic ill man laying in bed HENT: MM dry, poor foul-smelling dentition, trachea midline, +muscle wasting, LIJ MML CDI Lungs: clear, no wheezing, no accessory muscle use Cardiovascular: RRR, ext warm Abdomen: soft, nontender, hypoactive BS Extremities: extensive skin breakdown and pressure wounds, contactures (see media tab) Neuro: Able to talk but tough with how dry his mouth is, really hard to get orientation questions due to degree of dryness GU: foley in  place  Resolved Hospital Problem list   N/A  Assessment & Plan:  Shock state- septic +/- hemorrhagic; hx of esophagitis; he initially stated he was throwing up blood but now is not sure, may still be confused Aphasia- also an element of confusion, query breakthrough seizure, seems to be improving; discussed with Bhagat.  She would like an MRI before discharge due to issues with access to care. Functional quadriplegic Multiple pressure ulcers Hypokalemia Profound protein calorie malnutrition POA Question neglect Distant hx of seizures- ?supposed to be on phenobarb but levels are neg Hypokalemia Hx HTN  - Replete K - Vanc/zosyn; f/u blood/urine cultures - WOC consult - SLP consult - TOC consult RE: safety at group home - Oral care - Levo for MAP 65 - PPI gtt, GI consult (Dr. Myrtie Neither and Salome Arnt will see), trend H/H - If recurrent bleeding would give some vitamin K, calcium along with blood - Stat EEG if any mental status changes again - Will have ativan on standby also if any seizures (on benzos PTA) - Limit home sedating meds otherwise: will touch base with pharmD - MRI Brain before discharge  Best Practice (right click and "Reselect all SmartList Selections" daily)   Diet/type: NPO DVT prophylaxis: SCD GI prophylaxis: PPI Lines: Central line Foley:  Yes, and it is still needed Code Status:  full code Last date of multidisciplinary goals of care discussion [pending]  Labs   CBC: Recent Labs  Lab 08/19/23 1833 08/20/23 0857  WBC 13.9* 19.1*  NEUTROABS 10.2* 16.1*  HGB 4.5* 8.5*  HCT 19.9* 28.3*  MCV 62.4* 71.6*  PLT 526* 434*    Basic Metabolic Panel: Recent Labs  Lab 08/19/23 1833 08/20/23 0909  NA 135 134*  K 3.9 2.4*  CL 104 104  CO2 24 24  GLUCOSE 92 109*  BUN 16 12  CREATININE 0.55* 0.39*  CALCIUM 7.8* 7.1*  MG 2.0  --    GFR: Estimated Creatinine Clearance: 68.7 mL/min (A) (by C-G formula based on SCr of 0.39 mg/dL (L)). Recent Labs   Lab 08/19/23 1833 08/19/23 1946 08/19/23 2150 08/20/23 0857  WBC 13.9*  --   --  19.1*  LATICACIDVEN  --  1.8 5.4* 0.8    Liver Function Tests: Recent Labs  Lab 08/19/23 1833 08/20/23 0909  AST 40 23  ALT 30 21  ALKPHOS 95 75  BILITOT 0.5 1.3*  PROT 8.5* 6.2*  ALBUMIN 1.8* <1.5*   No results for input(s): "LIPASE", "AMYLASE" in the last 168 hours. Recent Labs  Lab 08/19/23 1946  AMMONIA 27    ABG No results found for: "PHART", "PCO2ART", "PO2ART", "HCO3", "TCO2", "ACIDBASEDEF", "O2SAT"   Coagulation Profile: Recent Labs  Lab 08/19/23 1833 08/20/23 0857  INR 1.5* 1.6*    Cardiac Enzymes: No results for input(s): "CKTOTAL", "CKMB", "CKMBINDEX", "TROPONINI" in the last 168 hours.  HbA1C: No results found for: "HGBA1C"  CBG: Recent Labs  Lab 08/19/23 1828 08/20/23 1119  GLUCAP 113* 102*    Review of Systems:   Limited due to expressive aphasia from profoundly dry mouth  Past Medical History:  He,  has a past medical history of Anemia, Anxiety, Atrophic gastritis (MAY 2015), Cerebral palsy (HCC), Depression, Duodenitis (MAY 2015), Erosive esophagitis (MAY 2015), Polycythemia (05/17/2015), and Seizures (HCC).   Surgical History:   Past Surgical History:  Procedure Laterality Date   COLONOSCOPY N/A 05/24/2014   KVQ:QVZDGL rectal polyp otherwise normal   ESOPHAGOGASTRODUODENOSCOPY N/A 05/23/2014   OVF:IEPPI HH/mild non-erosive gastritis/small gastric polyp   FLEXIBLE SIGMOIDOSCOPY N/A 05/23/2014   SLF: formed stool in colon and large amount of liquid stool     Social History:   reports that he quit smoking about 9 years ago. His smoking use included cigarettes. He has never used smokeless tobacco. He reports that he does not drink alcohol and does not use drugs.   Family History:  His family history is not on file.   Allergies No Known Allergies   Home Medications  Prior to Admission medications   Medication Sig Start Date End Date Taking?  Authorizing Provider  albuterol (VENTOLIN HFA) 108 (90 Base) MCG/ACT inhaler Inhale 2 puffs into the lungs every 4 (four) hours as needed for wheezing or shortness of breath.   Yes [provider]  atorvastatin (LIPITOR) 20 MG tablet  01/31/18  Yes [provider]  carvedilol (COREG) 3.125 MG tablet Take 3.125 mg by mouth 2 (two) times daily with a meal. 09/25/16  Yes [provider]  cyclobenzaprine (FLEXERIL) 10 MG tablet Take 10 mg by mouth 3 (three) times daily.   Yes [provider]  docusate sodium (COLACE) 100 MG capsule Take 100 mg by mouth every morning.   Yes [provider]  furosemide (LASIX) 20 MG tablet Take 40 mg by mouth daily. 09/26/16  Yes [provider]  liver oil-zinc oxide (BOUDREAUXS BUTT PASTE) 40 % ointment Apply 1 application topically 2 (two) times daily as needed for irritation.    Yes [provider]  Multiple Vitamins-Minerals (CENTRUM SILVER 50+MEN) TABS Take 1 tablet  by mouth daily.   Yes [provider]  pantoprazole (PROTONIX) 40 MG tablet Take 1 tablet (40 mg total) by mouth 2 (two) times daily before a meal. 05/26/14  Yes Fanta, Wayland Salinas, MD  potassium chloride SA (K-DUR,KLOR-CON) 20 MEQ tablet Take 40 mEq by mouth daily.    Yes [provider]  QUEtiapine (SEROQUEL) 50 MG tablet Take 50 mg by mouth 2 (two) times daily.   Yes [provider]  sertraline (ZOLOFT) 100 MG tablet Take 100 mg by mouth daily.   Yes [provider]  traMADol (ULTRAM) 50 MG tablet Take 50 mg by mouth 2 (two) times daily as needed for moderate pain.    Yes [provider]  LORazepam (ATIVAN) 1 MG tablet Take 1 mg by mouth every 8 (eight) hours as needed for anxiety.    [provider]     Critical care time: 34 mins independent of procedures

## 2023-08-21 ENCOUNTER — Inpatient Hospital Stay (HOSPITAL_COMMUNITY): Payer: Medicare Other

## 2023-08-21 DIAGNOSIS — B9561 Methicillin susceptible Staphylococcus aureus infection as the cause of diseases classified elsewhere: Secondary | ICD-10-CM | POA: Diagnosis not present

## 2023-08-21 DIAGNOSIS — D649 Anemia, unspecified: Secondary | ICD-10-CM | POA: Diagnosis not present

## 2023-08-21 DIAGNOSIS — R7881 Bacteremia: Secondary | ICD-10-CM | POA: Diagnosis not present

## 2023-08-21 DIAGNOSIS — E43 Unspecified severe protein-calorie malnutrition: Secondary | ICD-10-CM | POA: Insufficient documentation

## 2023-08-21 DIAGNOSIS — L8996 Pressure-induced deep tissue damage of unspecified site: Secondary | ICD-10-CM

## 2023-08-21 DIAGNOSIS — R579 Shock, unspecified: Secondary | ICD-10-CM | POA: Diagnosis not present

## 2023-08-21 DIAGNOSIS — K922 Gastrointestinal hemorrhage, unspecified: Secondary | ICD-10-CM | POA: Diagnosis not present

## 2023-08-21 DIAGNOSIS — R7401 Elevation of levels of liver transaminase levels: Secondary | ICD-10-CM | POA: Diagnosis not present

## 2023-08-21 DIAGNOSIS — A419 Sepsis, unspecified organism: Secondary | ICD-10-CM | POA: Diagnosis not present

## 2023-08-21 LAB — BPAM RBC
Blood Product Expiration Date: 202409042359
Blood Product Expiration Date: 202409202359
Blood Product Expiration Date: 202409202359
ISSUE DATE / TIME: 202408192151
ISSUE DATE / TIME: 202408200050
ISSUE DATE / TIME: 202408200452
Unit Type and Rh: 600
Unit Type and Rh: 6200
Unit Type and Rh: 6200

## 2023-08-21 LAB — BASIC METABOLIC PANEL
Anion gap: 12 (ref 5–15)
BUN: <5 mg/dL — ABNORMAL LOW (ref 6–20)
CO2: 25 mmol/L (ref 22–32)
Calcium: 8.2 mg/dL — ABNORMAL LOW (ref 8.9–10.3)
Chloride: 105 mmol/L (ref 98–111)
Creatinine, Ser: 0.67 mg/dL (ref 0.61–1.24)
GFR, Estimated: >60 mL/min (ref >60)
Glucose, Bld: 85 mg/dL (ref 70–99)
Potassium: 3.3 mmol/L — ABNORMAL LOW (ref 3.5–5.1)
Sodium: 142 mmol/L (ref 135–145)

## 2023-08-21 LAB — ECHOCARDIOGRAM COMPLETE
Area-P 1/2: 4.63 cm2
Height: 60 in
MV M vel: 0.74 m/s
MV Peak grad: 2.2 mmHg
S' Lateral: 3 cm
Weight: 1777.79 oz

## 2023-08-21 LAB — TYPE AND SCREEN
ABO/RH(D): A POS
Antibody Screen: NEGATIVE
Unit division: 0
Unit division: 0
Unit division: 0

## 2023-08-21 LAB — CBC
HCT: 28.9 % — ABNORMAL LOW (ref 39.0–52.0)
Hemoglobin: 8.8 g/dL — ABNORMAL LOW (ref 13.0–17.0)
MCH: 21.8 pg — ABNORMAL LOW (ref 26.0–34.0)
MCHC: 30.4 g/dL (ref 30.0–36.0)
MCV: 71.5 fL — ABNORMAL LOW (ref 80.0–100.0)
Platelets: 390 10*3/uL (ref 150–400)
RBC: 4.04 MIL/uL — ABNORMAL LOW (ref 4.22–5.81)
RDW: 29.2 % — ABNORMAL HIGH (ref 11.5–15.5)
WBC: 19.4 10*3/uL — ABNORMAL HIGH (ref 4.0–10.5)
nRBC: 0.2 % (ref 0.0–0.2)

## 2023-08-21 LAB — PHOSPHORUS: Phosphorus: 2.1 mg/dL — ABNORMAL LOW (ref 2.5–4.6)

## 2023-08-21 LAB — GLUCOSE, CAPILLARY
Glucose-Capillary: 92 mg/dL (ref 70–99)
Glucose-Capillary: 85 mg/dL (ref 70–99)
Glucose-Capillary: 91 mg/dL (ref 70–99)
Glucose-Capillary: 79 mg/dL (ref 70–99)
Glucose-Capillary: 87 mg/dL (ref 70–99)
Glucose-Capillary: 71 mg/dL (ref 70–99)

## 2023-08-21 LAB — MAGNESIUM: Magnesium: 1.4 mg/dL — ABNORMAL LOW (ref 1.7–2.4)

## 2023-08-21 LAB — IRON AND TIBC
Iron: 10 ug/dL — ABNORMAL LOW (ref 45–182)
Saturation Ratios: 8 % — ABNORMAL LOW (ref 17.9–39.5)
TIBC: 130 ug/dL — ABNORMAL LOW (ref 250–450)
UIBC: 120 ug/dL

## 2023-08-21 LAB — PROTIME-INR
INR: 1.7 — ABNORMAL HIGH (ref 0.8–1.2)
Prothrombin Time: 19.9 s — ABNORMAL HIGH (ref 11.4–15.2)

## 2023-08-21 MED ORDER — PANTOPRAZOLE SODIUM 40 MG IV SOLR
40.0000 mg | Freq: Two times a day (BID) | INTRAVENOUS | Status: AC
  Administered 2023-08-21 – 2023-08-23 (×6): 40 mg via INTRAVENOUS
  Filled 2023-08-21 (×6): qty 10

## 2023-08-21 MED ORDER — PANTOPRAZOLE SODIUM 40 MG PO TBEC
40.0000 mg | DELAYED_RELEASE_TABLET | Freq: Every day | ORAL | Status: DC
  Administered 2023-08-24: 40 mg via ORAL
  Filled 2023-08-21: qty 1

## 2023-08-21 MED ORDER — CEFAZOLIN SODIUM-DEXTROSE 2-4 GM/100ML-% IV SOLN
2.0000 g | Freq: Three times a day (TID) | INTRAVENOUS | Status: DC
  Administered 2023-08-21 – 2023-08-24 (×10): 2 g via INTRAVENOUS
  Filled 2023-08-21 (×10): qty 100

## 2023-08-21 MED ORDER — THIAMINE MONONITRATE 100 MG PO TABS
100.0000 mg | ORAL_TABLET | Freq: Every day | ORAL | Status: DC
  Administered 2023-08-21 – 2023-08-24 (×3): 100 mg via ORAL
  Filled 2023-08-21 (×4): qty 1

## 2023-08-21 MED ORDER — POTASSIUM CHLORIDE 20 MEQ PO PACK
40.0000 meq | PACK | Freq: Once | ORAL | Status: AC
  Administered 2023-08-21: 40 meq via ORAL
  Filled 2023-08-21: qty 2

## 2023-08-21 MED ORDER — MUPIROCIN 2 % EX OINT
TOPICAL_OINTMENT | Freq: Two times a day (BID) | CUTANEOUS | Status: DC
  Administered 2023-08-24: 1 via NASAL
  Filled 2023-08-21 (×2): qty 22

## 2023-08-21 MED ORDER — ADULT MULTIVITAMIN W/MINERALS CH
1.0000 | ORAL_TABLET | Freq: Every day | ORAL | Status: DC
  Administered 2023-08-21 – 2023-08-24 (×3): 1 via ORAL
  Filled 2023-08-21 (×4): qty 1

## 2023-08-21 MED ORDER — OSMOLITE 1.2 CAL PO LIQD
1000.0000 mL | ORAL | Status: AC
  Administered 2023-08-21: 1000 mL
  Filled 2023-08-21 (×2): qty 1000

## 2023-08-21 MED ORDER — JUVEN PO PACK
1.0000 | PACK | Freq: Two times a day (BID) | ORAL | Status: DC
  Administered 2023-08-22 – 2023-08-24 (×4): 1
  Filled 2023-08-21 (×3): qty 1

## 2023-08-21 MED ORDER — ENSURE ENLIVE PO LIQD
237.0000 mL | Freq: Three times a day (TID) | ORAL | Status: DC
  Administered 2023-08-21 – 2023-08-24 (×4): 237 mL via ORAL

## 2023-08-21 MED ORDER — POTASSIUM CHLORIDE 10 MEQ/100ML IV SOLN
10.0000 meq | INTRAVENOUS | Status: AC
  Administered 2023-08-21 (×4): 10 meq via INTRAVENOUS
  Filled 2023-08-21 (×4): qty 100

## 2023-08-21 NOTE — Progress Notes (Signed)
Speech Language Pathology Treatment: Dysphagia  Patient Details Name: Ian Hurley MRN: [id number] DOB: 03-Apr-1966 Today's Date: 08/21/2023 Time: 9562-1308 SLP Time Calculation (min) (ACUTE ONLY): 15 min  Assessment / Plan / Recommendation Clinical Impression  Pt seen for diet tolerance.  Pt alert and cooperative. Dependent for feeding and positioning prior to POs. Speech is dysarthric, but appropriate. Marked improvement from yesterday. Pt stating, "I couldn't talk yesterday." Per RN, pt with limited PO intake; however, no overt s/sx with straw sips of soda this AM. Brother and sister-in-law at bedside note that pt consumed 5-6 bites of pureed solids from meal tray and several sips of soda without s/sx pharyngeal dysphagia.  Pt presents with s/sx at least a moderate oral dysphagia c/b oral holding and delayed oral transit as well as mild oral residual. Residual appeared with purees and reduced with liquid wash. Oral deficits due to suspected lingual weakness/incoorindination Concern for possible pharyngeal dysphagia given inconsistent secondary swallows with thin liquids. No other overt s/sx pharyngeal dysphagia with additional trials. Recommed cautious continuation of a puree diet with thin liquids with safe swallowing strategies/aspiration precautions as outlined below. SLP to f/u per for diet tolerance and trials of upgraded textures.    HPI HPI: 57 year old man who presented to [facility] with for aphasia. Stroke work up negative. Pt w/ hx of cerebral palsy functional quadriplegic, prior erosive gastritis, and chronic pressure injuries.      SLP Plan  Continue with current plan of care      Recommendations for follow up therapy are one component of a multi-disciplinary discharge planning process, led by the attending physician.  Recommendations may be updated based on patient status, additional functional criteria and insurance authorization.    Recommendations  Diet recommendations:  Dysphagia 1 (puree);Thin liquid Medication Administration: Crushed with puree Supervision: Staff to assist with self feeding;Full supervision/cueing for compensatory strategies Compensations: Minimize environmental distractions;Slow rate;Small sips/bites;Follow solids with liquid Postural Changes and/or Swallow Maneuvers: Upright 30-60 min after meal (upright as much as possible for oral intake)                  Oral care QID;Staff/trained caregiver to provide oral care   Frequent or constant Supervision/Assistance Dysphagia, oropharyngeal phase (R13.12)     Continue with current plan of care    Clyde Canterbury, M.S., CCC-SLP Speech-Language Pathologist Secure Chat Preferred  O: [phone]  Woodroe Chen  08/21/2023, 1:17 PM

## 2023-08-21 NOTE — Progress Notes (Addendum)
Progress Note   Subjective  Day #2 Chief Complaint: Upper GI bleed and sepsis  Today, patient is still troubled by aphasia, unable to form words.  He is able to follow directions.  Per nursing staff no acute change overnight.  He is still requiring pressors.  No further signs of acute GI bleeding.   Objective   Vital signs in last 24 hours: Temp:  [97.9 F (36.6 C)-99.4 F (37.4 C)] 98.2 F (36.8 C) (08/21 0800) Pulse Rate:  [59-99] 87 (08/21 0800) Resp:  [12-28] 21 (08/21 0800) BP: (63-138)/(48-115) 102/80 (08/21 0800) SpO2:  [98 %-100 %] 100 % (08/21 0800) Weight:  [45.8 kg-50.4 kg] 50.4 kg (08/21 0500) Last BM Date :  (unknown) General:    Chronically ill and cachectic  AA male in NAD Heart:  Regular rate and rhythm; no murmurs Lungs: Respirations even and unlabored, lungs CTA bilaterally Abdomen:  Soft, nontender and nondistended. Normal bowel sounds. Psych:  Cooperative.   Intake/Output from previous day: 08/20 0701 - 08/21 0700 In: 4524.1 [I.V.:2822; Blood:362; IV Piggyback:1340.1] Out: 3200 [Urine:3200] Intake/Output this shift: Total I/O In: 192.2 [I.V.:92.6; IV Piggyback:99.7] Out: -   Lab Results: Recent Labs    08/20/23 0857 08/20/23 1332 08/21/23 0607  WBC 19.1* 20.1* 19.4*  HGB 8.5* 9.1* 8.8*  HCT 28.3* 30.1* 28.9*  PLT 434* 450* 390   BMET Recent Labs    08/19/23 1833 08/20/23 0909 08/20/23 1332 08/20/23 2101  NA 135 134* 137  --   K 3.9 2.4* 2.5* 2.9*  CL 104 104 105  --   CO2 24 24 24   --   GLUCOSE 92 109* 92  --   BUN 16 12 9   --   CREATININE 0.55* 0.39* 0.46*  --   CALCIUM 7.8* 7.1* 7.7*  --    LFT Recent Labs    08/20/23 0909  PROT 6.2*  ALBUMIN <1.5*  AST 23  ALT 21  ALKPHOS 75  BILITOT 1.3*   PT/INR Recent Labs    08/20/23 0857 08/21/23 0330  LABPROT 18.9* 19.9*  INR 1.6* 1.7*    Studies/Results: DG Chest Portable 1 View  Result Date: 08/20/2023 CLINICAL DATA:  Check line placement. EXAM: PORTABLE CHEST 1  VIEW COMPARISON:  Chest, abdomen and pelvis CT with contrast yesterday. FINDINGS: 1:03 a.m. Interval left IJ line insertion with tip in the distal SVC. The lungs are clear. There is no visible pneumothorax. Heart size and vasculature are normal. The mediastinum is normally outlined. There is no substantial pleural effusion. There is mild chronic elevation of the right diaphragm. There is a fractured lower thoracic/lumbar spinal fusion rod partially visible. Mild thoracic dextroscoliosis. IMPRESSION: Interval left IJ line insertion with tip in the distal SVC. No visible pneumothorax. No evidence of acute chest process. Electronically Signed   By: Almira Bar M.D.   On: 08/20/2023 01:36   CT CHEST ABDOMEN PELVIS W CONTRAST  Result Date: 08/19/2023 CLINICAL DATA:  Sepsis EXAM: CT CHEST, ABDOMEN, AND PELVIS WITH CONTRAST TECHNIQUE: Multidetector CT imaging of the chest, abdomen and pelvis was performed following the standard protocol during bolus administration of intravenous contrast. RADIATION DOSE REDUCTION: This exam was performed according to the departmental dose-optimization program which includes automated exposure control, adjustment of the mA and/or kV according to patient size and/or use of iterative reconstruction technique. CONTRAST:  75mL OMNIPAQUE IOHEXOL 350 MG/ML SOLN COMPARISON:  None Available. FINDINGS: CT CHEST FINDINGS Cardiovascular: Heart is normal size. Aorta is normal caliber. No filling  defects in the pulmonary arteries to suggest pulmonary emboli. Mediastinum/Nodes: No mediastinal, hilar, or axillary adenopathy. Trachea and esophagus are unremarkable. Thyroid unremarkable. Moderate-sized hiatal hernia. Lungs/Pleura: Lungs are clear. No focal airspace opacities or suspicious nodules. No effusions. Musculoskeletal: Chest wall soft tissues are unremarkable. No acute bony abnormality. CT ABDOMEN PELVIS FINDINGS Hepatobiliary: Diffuse low-density throughout the liver compatible with fatty  infiltration. No focal abnormality. Gallbladder unremarkable. Pancreas: No focal abnormality or ductal dilatation. Spleen: No focal abnormality.  Normal size. Adrenals/Urinary Tract: Foley catheter in the bladder. No renal or adrenal mass. No stones or hydronephrosis. Stomach/Bowel: Moderate stool burden throughout the colon. Stomach, large and small bowel grossly unremarkable. Vascular/Lymphatic: No evidence of aneurysm or adenopathy. Reproductive: No visible focal abnormality. Other: No free fluid or free air. Musculoskeletal: Posterior spinal rod in the thoracolumbar spine. Scoliosis. No acute bony abnormality. IMPRESSION: No acute findings in the chest, abdomen or pelvis. Moderate stool burden throughout the colon. Fatty liver. Moderate-sized hiatal hernia. Electronically Signed   By: Charlett Nose M.D.   On: 08/19/2023 23:48   CT HEAD CODE STROKE WO CONTRAST  Result Date: 08/19/2023 CLINICAL DATA:  Code stroke.  Neuro deficit, acute, stroke suspected EXAM: CT HEAD WITHOUT CONTRAST TECHNIQUE: Contiguous axial images were obtained from the base of the skull through the vertex without intravenous contrast. RADIATION DOSE REDUCTION: This exam was performed according to the departmental dose-optimization program which includes automated exposure control, adjustment of the mA and/or kV according to patient size and/or use of iterative reconstruction technique. COMPARISON:  CT head 12/24/2009. FINDINGS: Motion limited study. 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 No clear evidence of acute large vascular territory infarct or acute hemorrhage. No midline shift or hydrocephalus. Cerebral atrophy. Vascular: No hyperdense vessel. Skull: No acute fracture. Sinuses/Orbits: Clear sinuses.  No acute orbital findings. Other: No mastoid effusions. ASPECTS [facility] Stroke Program Early CT Score) total score (0-10 with 10 being  normal): 10. IMPRESSION: 1. Motion limited study with interval, but remote appearing infarct in the right insula and overlying operculum. An MRI could provide more sensitive evaluation for acute infarct if clinically warranted. 2. Remote left frontal infarct. Code stroke imaging results were communicated on 08/19/2023 at 6:51 pm to provider [facility] via telephone, who verbally acknowledged these results. Electronically Signed   By: Feliberto Harts M.D.   On: 08/19/2023 18:52       Assessment / Plan:   Assessment: 1.  Hematemesis with acute on chronic anemia: Hemoglobin 4.5--> 8.5 after 3 units PRBCs--> 8.8, previously worked up by [facility] in 2015 with EGD and colonoscopy which showed hiatal hernia and ulcerative esophagitis; suspect similar findings now 2.  Aphasia 3.  Sepsis 4.  Functional quadriplegic 5.  Multiple pressure ulcers 6.  Hypokalemia 7.  Peripheral and protein calorie malnutrition  Plan: 1.  Continue supportive measures 2.  At this point no further signs of acute GI bleeding, EGD is certainly not urgent, will allow him time to possibly come off pressors and recuperate prior to pursuing. 3.  Dysphagia diet as ordered 4.  Continue PPI infusion 5.  Antibiotics as per critical care recommendations  Thank you for your kind consultation, we will continue to follow along.   LOS: 1 day   Unk Lightning  08/21/2023, 10:14 AM   Addendum:  Spoke with patient's sister and POA Seaver Fossett her aware of plans for eventual EGD.  Answered all of her questions.  She will be the one who needs to sign the consent for [place].  She did tell me her baby brother is coming to visit today and he is allowed to ask questions/receive medical information as well, his name is Gillian Scarce.  Hyacinth Meeker, PA-C   I have taken an interval history, thoroughly reviewed the chart and examined the patient. I agree with the Advanced Practitioner's note, impression and recommendations, and have  recorded additional findings, impressions and recommendations below. I performed a substantive portion of this encounter (>50% time spent), including a complete performance of the medical decision making.  My additional thoughts are as follows:  No evidence of brisk GI bleeding.  Suspect ulcerative esophagitis and chronic occult blood loss as cause of the majority of his anemia.  He is still not medically optimal for upper endoscopy.  We will follow, and we have communicated with his family regarding plans for upper endoscopy as they are closely involved in his care.   [name] Office:[phone]

## 2023-08-21 NOTE — Progress Notes (Signed)
NAME:  Ian Hurley, MRN:  [id number], DOB:  July 30, 1966, LOS: 1 ADMISSION DATE:  08/19/2023, CONSULTATION DATE:  08/20/23 REFERRING MD:  [facility], CHIEF COMPLAINT:  AMS   History of Present Illness:  57 year old man w/ hx of cerebral palsy functional quadriplegic, prior erosive gastritis, chronic pressure injuries presenting with aphasia to [facility].  Initially a code stroke but imaging without anything acute.  Hgb noted to be 4 and Bps low.  Started on abx and given blood now mental status improved.  Patient admits he has been throwing up blood lately (he said yes to this initially then was more equivocal about it on subsequent asks).  His aphasia seems to be also partially explained by his extremely dry mouth.  Pertinent  Medical History  CP with functional quadriplegia, chronic contractures, and pressure ulcers Esophagitis Seizures  Significant Hospital Events: Including procedures, antibiotic start and stop dates in addition to other pertinent events   08/20/23 presented as a code stroke with concern for hemorrhagic shock 8/21 Remains on pressors, b blood cultures positive for MSSA  Interim History / Subjective:  Seen lying in bed in no acute distress  Objective   Blood pressure 102/80, pulse 87, temperature 98.2 F (36.8 C), temperature source Bladder, resp. rate (!) 21, height 5' (1.524 m), weight 50.4 kg, SpO2 100%.        Intake/Output Summary (Last 24 hours) at 08/21/2023 0853 Last data filed at 08/21/2023 0800 Gross per 24 hour  Intake 3155.32 ml  Output 3200 ml  Net -44.68 ml   Filed Weights   08/19/23 1934 08/20/23 1144 08/21/23 0500  Weight: 47.7 kg 45.8 kg 50.4 kg    Examination: General: Acute on chronic ill-appearing severely deconditioned frail cachectic contracted middle-aged male lying in bed in no acute distress HEENT: ETT, MM pink/moist, PERRL,  Neuro: Alert and interactive, very weak voice CV: s1s2 regular rate and rhythm, no murmur, rubs, or gallops,  PULM:  Clear to auscultation bilaterally, no work of breathing, no added breath sounds GI: soft, bowel sounds active in all 4 quadrants, non-tender, non-distended Extremities: warm/dry, no edema  Skin: no rashes or lesions  Resolved Hospital Problem list   N/A  Assessment & Plan:  Severe septic and component of hemorrhagic shock, POA -Blood cultures positive for Staphylococcus aureus -Hemoglobin 4.5 on admission P: Supplemental oxygen from sat goal greater than 92 Continue pressors for MAP goal greater than 65 Aggressive IV hydration provided on admission Monitor urine output Change antibiotics to Ancef  Hematic emesis with acute on chronic anemia -As above presented with a hemoglobin of 4.5 with reported hematemesis P: GI consulted, appreciate assistance Continue PPI infusion Trend H&H Maintain IV access EGD per GI  Aphasia -Also an element of confusion, query breakthrough seizure, seems to be improving; discussed with Bhagat.  She would like an MRI before discharge due to issues with access to care. Functional quadriplegic Distant hx of seizures P: Maintain neuro protective measure Nutrition and bowel regiment  Seizure precautions  Aspirations precautions  MRI prior to discharge   Multiple pressure ulcers P: Pressure relieving devices Local wound care  Hypokalemia P: Supplement as needed  Trend Bmet   Profound protein calorie malnutrition POA Question neglect P: Protein supplementation RD following PT/OT as able   Essential HTN Hyperlipidemia -Home medications include Lipitor, carvedilol, Lasix P: Continuous telemetry Resume home medications when appropriate  Best Practice (right click and "Reselect all SmartList Selections" daily)   Diet/type: NPO DVT prophylaxis: SCD GI prophylaxis:  PPI Lines: Central line Foley:  Yes, and it is still needed Code Status:  full code Last date of multidisciplinary goals of care discussion [pending]  Critical care  time:   CRITICAL CARE Performed by: Jabir Dahlem D. Harris  Total critical care time: 38 minutes  Critical care time was exclusive of separately billable procedures and treating other patients.  Critical care was necessary to treat or prevent imminent or life-threatening deterioration.  Critical care was time spent personally by me on the following activities: development of treatment plan with patient and/or surrogate as well as nursing, discussions with consultants, evaluation of patient's response to treatment, examination of patient, obtaining history from patient or surrogate, ordering and performing treatments and interventions, ordering and review of laboratory studies, ordering and review of radiographic studies, pulse oximetry and re-evaluation of patient's condition.  Urian Martenson D. Harris, NP-C [facility] Personal contact information can be found on Amion  If no contact or response made please call [phone] 08/21/2023, 10:45 AM

## 2023-08-21 NOTE — Plan of Care (Signed)
  Problem: Safety: Goal: Non-violent Restraint(s) 08/21/2023 1732 by Chauncy Passy, RN Outcome: Progressing 08/21/2023 1732 by Chauncy Passy, RN Outcome: Not Progressing   Problem: Education: Goal: Knowledge of General Education information will improve Description: Including pain rating scale, medication(s)/side effects and non-pharmacologic comfort measures 08/21/2023 1732 by Chauncy Passy, RN Outcome: Progressing 08/21/2023 1732 by Chauncy Passy, RN Outcome: Not Progressing   Problem: Health Behavior/Discharge Planning: Goal: Ability to manage health-related needs will improve 08/21/2023 1732 by Chauncy Passy, RN Outcome: Progressing 08/21/2023 1732 by Chauncy Passy, RN Outcome: Not Progressing   Problem: Clinical Measurements: Goal: Ability to maintain clinical measurements within normal limits will improve 08/21/2023 1732 by Chauncy Passy, RN Outcome: Progressing 08/21/2023 1732 by Chauncy Passy, RN Outcome: Not Progressing Goal: Will remain free from infection 08/21/2023 1732 by Chauncy Passy, RN Outcome: Progressing 08/21/2023 1732 by Chauncy Passy, RN Outcome: Not Progressing Goal: Diagnostic test results will improve 08/21/2023 1732 by Chauncy Passy, RN Outcome: Progressing 08/21/2023 1732 by Chauncy Passy, RN Outcome: Not Progressing Goal: Respiratory complications will improve 08/21/2023 1732 by Chauncy Passy, RN Outcome: Progressing 08/21/2023 1732 by Chauncy Passy, RN Outcome: Not Progressing Goal: Cardiovascular complication will be avoided 08/21/2023 1732 by Chauncy Passy, RN Outcome: Progressing 08/21/2023 1732 by Chauncy Passy, RN Outcome: Not Progressing   Problem: Activity: Goal: Risk for activity intolerance will decrease 08/21/2023 1732 by Chauncy Passy, RN Outcome: Progressing 08/21/2023 1732 by Chauncy Passy, RN Outcome: Not Progressing   Problem: Nutrition: Goal: Adequate nutrition will be maintained 08/21/2023 1732 by Chauncy Passy, RN Outcome:  Progressing 08/21/2023 1732 by Chauncy Passy, RN Outcome: Not Progressing   Problem: Pain Managment: Goal: General experience of comfort will improve 08/21/2023 1732 by Chauncy Passy, RN Outcome: Progressing 08/21/2023 1732 by Chauncy Passy, RN Outcome: Not Progressing

## 2023-08-21 NOTE — Progress Notes (Addendum)
Initial Nutrition Assessment  DOCUMENTATION CODES:  Severe malnutrition in context of chronic illness  INTERVENTION:  Continue current diet as ordered per SLP Feeding assistance Ensure Enlive po TID, each supplement provides 350 kcal and 20 grams of protein. MVI with minerals daily 1 packet Juven BID, each packet provides 95 calories, 2.5 grams of protein (collagen), and 9.8 grams of carbohydrate (3 grams sugar); also contains 7 grams of L-arginine and L-glutamine, 300 mg vitamin C, 15 mg vitamin E, 1.2 mcg vitamin B-12, 9.5 mg zinc, 200 mg calcium, and 1.5 g  Calcium Beta-hydroxy-Beta-methylbutyrate to support wound healing As pt's intake is inadequate and malnutrition is present, recommend the following: Osmolite 1.2 @ 13mL/h (127mL/d) Start at 20 and advance by 10mL q12h to goal of 50 This will provide 1440kcal and 67g of protein per day Monitor magnesium and phosphorus every 12 hours x 4 occurrences, MD to replete as needed, as pt is at risk for refeeding syndrome given severe malnutrition Thiamine 100mg  x 5 days  NUTRITION DIAGNOSIS:  Severe Malnutrition related to chronic illness as evidenced by severe fat depletion, severe muscle depletion.  GOAL:  Patient will meet greater than or equal to 90% of their needs  MONITOR:  PO intake, Supplement acceptance, Weight trends, Skin, Labs  REASON FOR ASSESSMENT:  Rounds    ASSESSMENT:  Pt with hx of cerebral palsy, functional quadriplegia, and chronic pressure injuries presented to ED with aphasia. No CVA seen on imaging, but pt found to have a Hgb of 4 and low BP.  8/19 - Presented to [facility], transferred to [facility] 8/20 - SLP evaluation, DYS 1, thin  Pt resting in bed at the time of assessment. No family present. Pt awake and alert but voice is very soft and does not verbalize much when asked nutrition related questions.   Lunch tray at bedside noted to have ~3-4 bites missing. RN reports that he did not want breakfast and that  he has been taking very small sips of liquids. Attempted to give oral potassium but it is taking a significant amount of time to consume.   Limited weight hx available in chart. Unsure of time frame for weight loss but weight has declined since 2017.  Pt very thin on exam with loss of fat and muscle stores. Loss of muscle likely due in part to pt's mobility status. Edema to the lower legs as well as scattered wounds from the heels to the knees on both legs.   Discussed with provider and RN. Nutrition interventions are limited at this time as pt's intake is minimal. Will add oral supplements but pt will likely not consume them based on his current intake so they will not be effective in meeting nutrition needs. If family wishes to pursue aggressive measures, would recommend placement of cortrak tube and beginning enteral nutrition. Provider to discuss with family.   Meal intake: 0% x 2 meals this admission   Intake/Output Summary (Last 24 hours) at 08/21/2023 1513 Last data filed at 08/21/2023 1200 Gross per 24 hour  Intake 2550.11 ml  Output 3700 ml  Net -1149.89 ml  Net IO Since Admission: 6,346.92 mL [08/21/23 1513]  Nutritionally Relevant Medications: Scheduled Meds:  insulin aspart  0-6 Units Subcutaneous Q4H   pantoprazole  40 mg Intravenous Q12H   PRN Meds: docusate sodium, polyethylene glycol  Labs Reviewed: K 3.3 BUN <5 CBG ranges from 62-91 mg/dL over the last 24 hours   NUTRITION - FOCUSED PHYSICAL EXAM: Flowsheet Row Most Recent Value  Orbital Region Moderate depletion  Upper Arm Region Severe depletion  Thoracic and Lumbar Region Severe depletion  Buccal Region Moderate depletion  Temple Region Moderate depletion  Clavicle Bone Region Severe depletion  Clavicle and Acromion Bone Region Severe depletion  Scapular Bone Region Severe depletion  Dorsal Hand Mild depletion  [edema]  Patellar Region Severe depletion  Anterior Thigh Region Severe depletion  Posterior  Calf Region Mild depletion  [edema]  Edema (RD Assessment) Moderate  [hands, lower legs]  Hair Reviewed  Eyes Reviewed  Mouth Reviewed  Skin Reviewed  [wounds - primarily to legs and feet]  Nails Reviewed    Diet Order:   Diet Order             DIET - DYS 1 Room service appropriate? Yes with Assist; Fluid consistency: Thin  Diet effective now                   EDUCATION NEEDS:  Not appropriate for education at this time  Skin:  Skin Assessment: Skin Integrity Issues: Stage 4: - left leg (6 x 3 x 1 cm) Stage 3: - right foot (2 x 2 x 0.2 cm) - right arm  - left heel Unstageable: - Right thigh (3 x 3 cm) - right elbow (3 x 3 x 0.3 cm) - right ischial tuberosity (7 x 9 cm) - left buttocks (3 x 5 cm) - left ischial tuberosity (4 x 7 cm) Stage 2 - right lateral lumbar  Last BM:  unsure  Height:  Ht Readings from Last 1 Encounters:  08/19/23 5' (1.524 m)    Weight:  Wt Readings from Last 1 Encounters:  08/21/23 50.4 kg    Ideal Body Weight:  48.2 kg  BMI:  Body mass index is 21.7 kg/m.  Estimated Nutritional Needs:  Kcal:  1300-1500 kcal/d Protein:  65-85g/d Fluid:  1.5L/d    Greig Castilla, RD, LDN Clinical Dietitian RD pager # available in AMION  After hours/weekend pager # available in [facility]

## 2023-08-21 NOTE — Consult Note (Signed)
[facility]    Date of Admission:  08/19/2023   Total days of inpatient antibiotics 2        Reason for Consult: MSSA bacteremia    Principal Problem:   Shock (HCC) Active Problems:   Protein-calorie malnutrition, severe   Assessment: 57 year old male with cerebral palsy resulting functional quadriplegia, multiple pressure wounds, prior history of aggressive erosive gastritis, seizures, admitted with aphasia found to have hemoglobin 4.  ID engaged for MSSA bacteremia  #MSSA bacteremia suspected secondary to pressure wounds #Leukocytosis secondary to #1 - I reviewed imaging and chart there are multiple possible ports of entry given open pressure wounds - I do not see where he has had prior lines(prior to hospitalization)  Recommendations:  -Repeat blood cultures to ensure clearance - TTE - Follow blood cultures - Discontinue vancomycin - Start cefazolin - Engage wound care for pressure wounds, primary noted wound care consult. -Noted to have poor dentition, will get CT maxillofacial  -Patient has a CVC central placed on 8/20 prior to blood cultures clearance.  Will have to be removed.  #History of seizures #Aphasia - Neurology engaged, recommend MRI brain once stabilized and able to tolerate  #GI hemorrhage and history of esophagitis - GI following. Microbiology:   Antibiotics: Continue-present pip-tazo 8/20-present Metronidazole 8/19 Cefepime 8/19  Cultures: Blood 8/19 2/2 MSSA Urine  Other   HPI: Ian Hurley is a 57 y.o. male with multiple pressure ulcers cerebral palsy functional quadriplegia, seizures, prior erosive gastritis, chronic partial injuries presented with aphasia to [facility].  Initially called code stroke found to have hemoglobin for low BPs.  Noted extremely dry mouth.  Mated for septic/hemorrhagic shock and aphasia workup.  Found to have MSSA bacteremia ID engaged.   Review of Systems: Review of Systems   All other systems reviewed and are negative.   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 No pertinent family history. Scheduled Meds:  Chlorhexidine Gluconate Cloth  6 each Topical Daily   feeding supplement  237 mL Oral TID BM   insulin aspart  0-6 Units Subcutaneous Q4H   leptospermum manuka honey  1 Application Topical Daily   multivitamin with minerals  1 tablet Oral Daily   mupirocin ointment   Nasal BID   [START ON 08/22/2023] nutrition supplement (JUVEN)  1 packet Per Tube BID BM   mouth rinse  15 mL Mouth Rinse 4 times per day   pantoprazole  40 mg Intravenous Q12H   Followed by   Melene Muller ON 08/24/2023] pantoprazole  40 mg Oral Daily   thiamine  100 mg Oral Daily  Continuous Infusions:   ceFAZolin (ANCEF) IV Stopped (08/21/23 1059)   feeding supplement (OSMOLITE 1.2 CAL)     potassium chloride 10 mEq (08/21/23 1418)   PRN Meds:.docusate sodium, LORazepam, mouth rinse, polyethylene glycol No Known Allergies  OBJECTIVE: Blood pressure 96/82, pulse 97, temperature 98.1 F (36.7 C), resp. rate 15, height 5' (1.524 m), weight 50.4 kg, SpO2 100%.  Physical Exam Constitutional:      General: He is not in acute distress.    Appearance: He is normal weight. He is not toxic-appearing.  HENT:     Head: Normocephalic and atraumatic.     Right Ear: External  ear normal.     Left Ear: External ear normal.     Nose: No congestion or rhinorrhea.     Mouth/Throat:     Comments: Poor dentition Eyes:     Extraocular Movements: Extraocular movements intact.     Conjunctiva/sclera: Conjunctivae normal.     Pupils: Pupils are equal, round, and reactive to light.  Cardiovascular:     Rate and Rhythm: Normal rate and regular rhythm.     Heart sounds: No murmur heard.    No friction rub. No gallop.  Pulmonary:     Effort: Pulmonary effort is normal.     Breath sounds: Normal breath sounds.  Abdominal:     General: Abdomen is flat. Bowel sounds are normal.     Palpations: Abdomen is soft.  Musculoskeletal:        General: No swelling.     Cervical back: Normal range of motion and neck supple.     Comments: Multiple pressure wounds Dry mouth  Skin:    General: Skin is warm and dry.  Neurological:     General: No focal deficit present.     Mental Status: He is oriented to person, place, and time.  Psychiatric:        Mood and Affect: Mood normal.     Lab Results Lab Results  Component Value Date   WBC 19.4 (H) 08/21/2023   HGB 8.8 (L) 08/21/2023   HCT 28.9 (L) 08/21/2023   MCV 71.5 (L) 08/21/2023   PLT 390 08/21/2023    Lab Results  Component Value Date   CREATININE 0.67 08/21/2023   BUN <5 (L) 08/21/2023   NA 142 08/21/2023   K 3.3 (L) 08/21/2023   CL 105 08/21/2023   CO2 25 08/21/2023    Lab Results  Component Value Date   ALT 21 08/20/2023   AST 23 08/20/2023   ALKPHOS 75 08/20/2023   BILITOT 1.3 (H) 08/20/2023       Danelle Earthly, MD [facility] 08/21/2023, 3:30 PM   I have personally spent 84 minutes involved in face-to-face and non-face-to-face activities for this patient on the day of the visit. Professional time spent includes the following activities: Preparing to see the patient (review of tests), Obtaining and/or reviewing separately obtained history  (admission/discharge record), Performing a medically appropriate examination and/or evaluation , Ordering medications/tests/procedures, referring and communicating with other health care professionals, Documenting clinical information in the EMR, Independently interpreting results (not separately reported), Communicating results to the patient/family/caregiver, Counseling and educating the patient/family/caregiver and Care coordination (not separately reported).

## 2023-08-21 NOTE — Progress Notes (Signed)
  Echocardiogram 2D Echocardiogram has been performed.  Maren Reamer 08/21/2023, 4:19 PM

## 2023-08-21 NOTE — Progress Notes (Signed)
PCCM Progress Note  Discussed plan of care with patients bedside nurse and RD regarding severe malnutrition. I attempted to call patients sister to discuss options but was unable to establish contact. Decision made to proceed with placement of cortrak to assist in nutritional support.   Shukri Nistler D. Harris, NP-C [facility] Personal contact information can be found on Amion  If no contact or response made please call [phone] 08/21/2023, 4:01 PM

## 2023-08-21 NOTE — Progress Notes (Signed)
EEG complete - results pending 

## 2023-08-21 NOTE — Procedures (Signed)
Cortrak  Tube Type:  Cortrak - 43 inches Tube Location:  Left nare Initial Placement:  Stomach Secured by: Bridle Technique Used to Measure Tube Placement:  Marking at nare/corner of mouth Cortrak Secured At:  60 cm   Cortrak Tube Team Note:  Consult received to place a Cortrak feeding tube.   X-ray is required, abdominal x-ray has been ordered by the Cortrak team. Please confirm tube placement before using the Cortrak tube.   If the tube becomes dislodged please keep the tube and contact the Cortrak team at [email or web address] for replacement.  If after hours and replacement cannot be delayed, place a NG tube and confirm placement with an abdominal x-ray.   Betsey Holiday MS, RD, LDN Please refer to [facility] for RD and/or RD on-call/weekend/after hours pager

## 2023-08-22 ENCOUNTER — Inpatient Hospital Stay (HOSPITAL_COMMUNITY): Payer: Medicare Other

## 2023-08-22 DIAGNOSIS — R579 Shock, unspecified: Secondary | ICD-10-CM | POA: Diagnosis not present

## 2023-08-22 DIAGNOSIS — R569 Unspecified convulsions: Secondary | ICD-10-CM | POA: Diagnosis not present

## 2023-08-22 DIAGNOSIS — D500 Iron deficiency anemia secondary to blood loss (chronic): Secondary | ICD-10-CM | POA: Insufficient documentation

## 2023-08-22 DIAGNOSIS — K920 Hematemesis: Secondary | ICD-10-CM

## 2023-08-22 DIAGNOSIS — R4182 Altered mental status, unspecified: Secondary | ICD-10-CM

## 2023-08-22 DIAGNOSIS — K921 Melena: Secondary | ICD-10-CM | POA: Diagnosis not present

## 2023-08-22 DIAGNOSIS — A419 Sepsis, unspecified organism: Secondary | ICD-10-CM | POA: Diagnosis not present

## 2023-08-22 DIAGNOSIS — K922 Gastrointestinal hemorrhage, unspecified: Secondary | ICD-10-CM | POA: Diagnosis not present

## 2023-08-22 DIAGNOSIS — E871 Hypo-osmolality and hyponatremia: Secondary | ICD-10-CM

## 2023-08-22 DIAGNOSIS — D649 Anemia, unspecified: Secondary | ICD-10-CM | POA: Diagnosis not present

## 2023-08-22 LAB — COMPREHENSIVE METABOLIC PANEL
ALT: 13 U/L (ref 0–44)
AST: 17 U/L (ref 15–41)
Albumin: 2 g/dL — ABNORMAL LOW (ref 3.5–5.0)
Alkaline Phosphatase: 68 U/L (ref 38–126)
Anion gap: 10 (ref 5–15)
BUN: 5 mg/dL — ABNORMAL LOW (ref 6–20)
CO2: 21 mmol/L — ABNORMAL LOW (ref 22–32)
Calcium: 7.5 mg/dL — ABNORMAL LOW (ref 8.9–10.3)
Chloride: 103 mmol/L (ref 98–111)
Creatinine, Ser: 0.45 mg/dL — ABNORMAL LOW (ref 0.61–1.24)
GFR, Estimated: >60 mL/min (ref >60)
Glucose, Bld: 139 mg/dL — ABNORMAL HIGH (ref 70–99)
Potassium: 3.1 mmol/L — ABNORMAL LOW (ref 3.5–5.1)
Sodium: 134 mmol/L — ABNORMAL LOW (ref 135–145)
Total Bilirubin: 0.6 mg/dL (ref 0.3–1.2)
Total Protein: 6 g/dL — ABNORMAL LOW (ref 6.5–8.1)

## 2023-08-22 LAB — GLUCOSE, CAPILLARY
Glucose-Capillary: 129 mg/dL — ABNORMAL HIGH (ref 70–99)
Glucose-Capillary: 132 mg/dL — ABNORMAL HIGH (ref 70–99)
Glucose-Capillary: 95 mg/dL (ref 70–99)
Glucose-Capillary: 97 mg/dL (ref 70–99)

## 2023-08-22 LAB — CULTURE, BLOOD (ROUTINE X 2): Special Requests: ADEQUATE

## 2023-08-22 LAB — MAGNESIUM: Magnesium: 1.4 mg/dL — ABNORMAL LOW (ref 1.7–2.4)

## 2023-08-22 LAB — PHOSPHORUS: Phosphorus: 1.8 mg/dL — ABNORMAL LOW (ref 2.5–4.6)

## 2023-08-22 MED ORDER — LACTATED RINGERS IV BOLUS
1000.0000 mL | Freq: Once | INTRAVENOUS | Status: AC
  Administered 2023-08-22: 1000 mL via INTRAVENOUS

## 2023-08-22 MED ORDER — LACTATED RINGERS IV SOLN: INTRAVENOUS | Status: DC

## 2023-08-22 MED ORDER — MAGNESIUM SULFATE 4 GM/100ML IV SOLN
4.0000 g | Freq: Once | INTRAVENOUS | Status: AC
  Administered 2023-08-22: 4 g via INTRAVENOUS
  Filled 2023-08-22: qty 100

## 2023-08-22 MED ORDER — POTASSIUM PHOSPHATES 15 MMOLE/5ML IV SOLN
30.0000 mmol | Freq: Once | INTRAVENOUS | Status: AC
  Administered 2023-08-22: 30 mmol via INTRAVENOUS
  Filled 2023-08-22: qty 10

## 2023-08-22 MED ORDER — POTASSIUM CHLORIDE 10 MEQ/100ML IV SOLN
10.0000 meq | INTRAVENOUS | Status: AC
  Administered 2023-08-22 (×4): 10 meq via INTRAVENOUS
  Filled 2023-08-22 (×4): qty 100

## 2023-08-22 NOTE — Progress Notes (Signed)
PROGRESS NOTE    Ian Hurley  MVH:[id number]  DOB: 06-May-1966  DOA: 08/19/2023 PCP: Benetta Spar, MD Outpatient Specialists:   Hospital course: 57 year old man w/ hx of cerebral palsy functional quadriplegic, prior erosive gastritis, chronic pressure injuries presenting with altered mental status and aphasia.  Workup revealed hemoglobin 4, MSSA bacteremia.  Stroke was ruled out by imaging and neurology felt "aphasia" was secondary to metabolic encephalopathy.  Patient admitted to ICU on pressor with improvement.  Now transferred out to floor.  He has admitted to having had hematemesis lately.  Subjective:  Patient is alert and oriented and is able to speak with me although difficult to understand due to extremely dry mouth.  However he answers questions appropriately.  Denies any discomfort.  Denies being thirsty.   Objective: Vitals:   08/21/23 2309 08/22/23 0256 08/22/23 0539 08/22/23 0725  BP: 98/64 (!) 106/56 (!) 94/56 (!) 94/59  Pulse: 100  (!) 111 (!) 110  Resp: 18  (!) 21 20  Temp: 98.8 F (37.1 C) 98.8 F (37.1 C) 99 F (37.2 C) 98 F (36.7 C)  TempSrc: Oral Oral Axillary Axillary  SpO2: 100% 100% 98% 97%  Weight:   47.7 kg   Height:        Intake/Output Summary (Last 24 hours) at 08/22/2023 1552 Last data filed at 08/21/2023 2200 Gross per 24 hour  Intake 534.16 ml  Output 750 ml  Net -215.84 ml   Filed Weights   08/20/23 1144 08/21/23 0500 08/22/23 0539  Weight: 45.8 kg 50.4 kg 47.7 kg     Exam:  General: Patient with multiple flexion contractures and extension contracture of neck lying in bed.  He is able to answer me appropriately with yes/no questions although is difficult to understand. Eyes: sclera anicteric, conjuctiva mild injection bilaterally CVS: S1-S2, regular  Respiratory:  decreased air entry bilaterally secondary to decreased inspiratory effort, rales at bases  GI: NABS, soft, NT  LE: Warm and well-perfused   Data  Reviewed:  Basic Metabolic Panel: Recent Labs  Lab 08/19/23 1833 08/20/23 0909 08/20/23 1332 08/20/23 2101 08/21/23 1030 08/21/23 2129 08/22/23 0610  NA 135 134* 137  --  142  --  134*  K 3.9 2.4* 2.5* 2.9* 3.3*  --  3.1*  CL 104 104 105  --  105  --  103  CO2 24 24 24   --  25  --  21*  GLUCOSE 92 109* 92  --  85  --  139*  BUN 16 12 9   --  <5*  --  5*  CREATININE 0.55* 0.39* 0.46*  --  0.67  --  0.45*  CALCIUM 7.8* 7.1* 7.7*  --  8.2*  --  7.5*  MG 2.0  --   --   --   --  1.4* 1.4*  PHOS  --   --   --   --   --  2.1* 1.8*    CBC: Recent Labs  Lab 08/19/23 1833 08/20/23 0857 08/20/23 1332 08/21/23 0607  WBC 13.9* 19.1* 20.1* 19.4*  NEUTROABS 10.2* 16.1* 16.6*  --   HGB 4.5* 8.5* 9.1* 8.8*  HCT 19.9* 28.3* 30.1* 28.9*  MCV 62.4* 71.6* 70.2* 71.5*  PLT 526* 434* 450* 390     Scheduled Meds:  Chlorhexidine Gluconate Cloth  6 each Topical Daily   feeding supplement  237 mL Oral TID BM   insulin aspart  0-6 Units Subcutaneous Q4H   leptospermum manuka honey  1 Application Topical Daily   multivitamin with minerals  1 tablet Oral Daily   mupirocin ointment   Nasal BID   nutrition supplement (JUVEN)  1 packet Per Tube BID BM   mouth rinse  15 mL Mouth Rinse 4 times per day   pantoprazole  40 mg Intravenous Q12H   Followed by   Melene Muller ON 08/24/2023] pantoprazole  40 mg Oral Daily   thiamine  100 mg Oral Daily   Continuous Infusions:   ceFAZolin (ANCEF) IV 2 g (08/22/23 1040)   feeding supplement (OSMOLITE 1.2 CAL) 50 mL/hr at 08/21/23 1800   magnesium sulfate bolus IVPB     potassium PHOSPHATE IVPB (in mmol)       Assessment & Plan:   MSSA bacteremia Stage III and IV pressure ulcers Sepsis physiology Continue cefazolin as initiated in MICU Leukocytosis however is not improving Can consider broadening coverage given multiple stage III and IV ulcers if persistent leukocytosis Pressures are trending somewhat soft, will start LR bolus and follow-up with  maintenance at 100/hour  Electrolyte abnormalities Hypokalemia Hypomagnesemia Hypophosphatemia Continue aggressive repletion and recheck tomorrow  Metabolic encephalopathy Aphasia Metabolic encephalopathy/aphasia appears to be resolved Patient is alert and oriented to my exam, answers questions appropriately and logically Per MICU note, there was a question of breakthrough seizure and neurology would like an MRI prior to discharge primarily due to issues of access to care  Profound anemia, hemoglobin 4 on admission H/O erosive esophagitis Hematemesis Patient has responded well to transfusion and H&H have been stable Has been following and are planning repeat EGD tomorrow although they think erosive esophagitis is likely cause of hematemesis. Continue pantoprazole  HTN Continue present management  Protein calorie malnutrition POA Prescient RD involvement    DVT prophylaxis: SCD Code Status: Full Family Communication: None today Disposition Plan:   Patient is from: Home  Anticipated Discharge Location: Possible LTAC/45M    Studies: MR BRAIN WO CONTRAST  Result Date: 08/22/2023 CLINICAL DATA:  Mental status change, unknown cause EXAM: MRI HEAD WITHOUT CONTRAST TECHNIQUE: Multiplanar, multiecho pulse sequences of the brain and surrounding structures were obtained without intravenous contrast. COMPARISON:  CT head 12/24/2009. FINDINGS: Motion limited and incomplete study.  Within this limitation: Brain: No acute infarction, hemorrhage, hydrocephalus, extra-axial collection or mass lesion. Bifrontal remote infarcts. Cerebral atrophy. Vascular: Major arterial flow voids are maintained. Skull and upper cervical spine: Normal marrow signal. Sinuses/Orbits: Mostly clear sinuses. No acute orbital findings on motion limited assessment. IMPRESSION: 1. Motion limited and incomplete study without evidence of acute intracranial abnormality. 2. Remote bifrontal infarcts. 3.  Cerebral atrophy  (ICD10-G31.9). Electronically Signed   By: Feliberto Harts M.D.   On: 08/22/2023 15:39   CT MAXILLOFACIAL WO CONTRAST  Result Date: 08/22/2023 CLINICAL DATA:  poor dentition EXAM: CT MAXILLOFACIAL WITHOUT CONTRAST TECHNIQUE: Multidetector CT imaging of the maxillofacial structures was performed. Multiplanar CT image reconstructions were also generated. RADIATION DOSE REDUCTION: This exam was performed according to the departmental dose-optimization program which includes automated exposure control, adjustment of the mA and/or kV according to patient size and/or use of iterative reconstruction technique. COMPARISON:  None Available. FINDINGS: Osseous: No fracture or mandibular dislocation. No destructive process. Orbits: Negative. No traumatic or inflammatory finding. Sinuses: Right maxillary sinus retention cyst. Otherwise, visualized sinuses are clear. Soft tissues: Negative. Limited intracranial: No significant or unexpected finding. Other: Poor dentate shin with multiple periapical lucencies and dental caries. No visible fluid collection. IMPRESSION: 1. No evidence of acute abnormality. 2. Poor dentition without  visible fluid collection or inflammatory change. Electronically Signed   By: Feliberto Harts M.D.   On: 08/22/2023 15:36   DG Hand 2 View Right  Result Date: 08/22/2023 CLINICAL DATA:  Metal implant. Clearance for MRI.Patient's arm, wrist, and hand contracted at time of imaging. EXAM: RIGHT HAND - 2 VIEW COMPARISON:  None available FINDINGS: Frontal and lateral views of the left hand. There is high-grade flexion of all the metacarpophalangeal joints. There is a metallic pin there appears to be traversing the distal radius through the distal aspect of the fourth metatarsal shaft. There is complete osseous fusion of the radiocarpal joints. IMPRESSION: 1. Metallic pin traversing the distal radius through the distal aspect of the fourth metatarsal shaft. 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 This implant is likely stable and MRI compatible. 2. Complete osseous fusion of the radiocarpal joints. Electronically Signed   By: Neita Garnet M.D.   On: 08/22/2023 14:53   EEG adult  Result Date: 08/22/2023 Charlsie Quest, MD     08/22/2023  8:38 AM Patient Name: Ian Hurley MRN: [id number] Epilepsy Attending: Charlsie Quest Referring Physician/Provider: Talitha Givens, NP Date: 08/21/2023 Duration: 23.26 mins Patient history: 57yo M with h.o seizures now with ams getting eeg to evaluate for seizure. Level of alertness: Awake AEDs during EEG study: None Technical aspects: This EEG study was done with scalp electrodes positioned according to the 10-20 International system of electrode placement. Electrical activity was reviewed with band pass filter of 1-70Hz , sensitivity of 7 uV/mm, display speed of 9mm/sec with a 60Hz  notched filter applied as appropriate. EEG data were recorded continuously and digitally stored.  Video monitoring was available and reviewed as appropriate. Description: EEG showed continuous generalized 3 to 6 Hz theta-delta slowing.  Hyperventilation and photic stimulation were not performed.   Of note, study was technically difficult due to significant myogenic artifact. ABNORMALITY - Continuous slow, generalized IMPRESSION: This technically difficult study is suggestive of moderate diffuse encephalopathy. No seizures or epileptiform discharges were seen throughout the recording. Charlsie Quest   ECHOCARDIOGRAM COMPLETE  Result Date: 08/21/2023    ECHOCARDIOGRAM REPORT   Patient Name:   Ian Hurley Date of Exam: 08/21/2023 Medical Rec #:  [id number]         Height:       60.0 in Accession #:    [id number]        Weight:       111.1 lb Date of Birth:  06-14-1966          BSA:          1.454 m Patient Age:    57 years          BP:           117/73 mmHg Patient Gender: M                 HR:           92 bpm. Exam Location:  Inpatient Procedure: 2D Echo,  Cardiac Doppler and Color Doppler Indications:    Bacteremia R78.81  History:        Patient has no prior history of Echocardiogram examinations.                 Shock; Risk Factors:Former Smoker. Seizures.  Sonographer:    Aron Baba Referring Phys: [phone] WHITNEY D HARRIS  Sonographer Comments: No subcostal window. Image acquisition challenging due to respiratory motion. IMPRESSIONS  1. Left ventricular ejection  fraction, by estimation, is 55 to 60%. The left ventricle has normal function. The left ventricle has no regional wall motion abnormalities. Left ventricular diastolic parameters were normal.  2. Right ventricular systolic function is normal. The right ventricular size is normal. Tricuspid regurgitation signal is inadequate for assessing PA pressure.  3. The mitral valve is grossly normal. Trivial mitral valve regurgitation. No evidence of mitral stenosis.  4. Focal calium noted on the LCC. The aortic valve is tricuspid. Aortic valve regurgitation is not visualized. No aortic stenosis is present. Conclusion(s)/Recommendation(s): No evidence of valvular vegetations on this transthoracic echocardiogram. Consider a transesophageal echocardiogram to exclude infective endocarditis if clinically indicated. FINDINGS  Left Ventricle: Left ventricular ejection fraction, by estimation, is 55 to 60%. The left ventricle has normal function. The left ventricle has no regional wall motion abnormalities. The left ventricular internal cavity size was normal in size. There is  no left ventricular hypertrophy. Left ventricular diastolic parameters were normal. Right Ventricle: The right ventricular size is normal. No increase in right ventricular wall thickness. Right ventricular systolic function is normal. Tricuspid regurgitation signal is inadequate for assessing PA pressure. Left Atrium: Left atrial size was normal in size. Right Atrium: Right atrial size was normal in size. Pericardium: There is no evidence of  pericardial effusion. Mitral Valve: The mitral valve is grossly normal. Trivial mitral valve regurgitation. No evidence of mitral valve stenosis. Tricuspid Valve: The tricuspid valve is grossly normal. Tricuspid valve regurgitation is not demonstrated. No evidence of tricuspid stenosis. Aortic Valve: Focal calium noted on the LCC. The aortic valve is tricuspid. Aortic valve regurgitation is not visualized. No aortic stenosis is present. Pulmonic Valve: The pulmonic valve was grossly normal. Pulmonic valve regurgitation is not visualized. No evidence of pulmonic stenosis. Aorta: The aortic root is normal in size and structure. Venous: The inferior vena cava was not well visualized. IAS/Shunts: The atrial septum is grossly normal.  LEFT VENTRICLE PLAX 2D LVIDd:         4.20 cm   Diastology LVIDs:         3.00 cm   LV e' medial:    8.38 cm/s LV PW:         0.60 cm   LV E/e' medial:  8.8 LV IVS:        0.50 cm   LV e' lateral:   10.90 cm/s LVOT diam:     1.90 cm   LV E/e' lateral: 6.8 LV SV:         48 LV SV Index:   33 LVOT Area:     2.84 cm  RIGHT VENTRICLE RV S prime:     11.80 cm/s TAPSE (M-mode): 1.6 cm LEFT ATRIUM           Index        RIGHT ATRIUM          Index LA diam:      3.30 cm 2.27 cm/m   RA Area:     9.96 cm LA Vol (A2C): 13.2 ml 9.08 ml/m   RA Volume:   16.60 ml 11.42 ml/m LA Vol (A4C): 27.3 ml 18.78 ml/m  AORTIC VALVE LVOT Vmax:   87.70 cm/s LVOT Vmean:  60.200 cm/s LVOT VTI:    0.170 m  AORTA Ao Root diam: 3.20 cm Ao Asc diam:  2.60 cm MITRAL VALVE MV Area (PHT): 4.63 cm    SHUNTS MV Decel Time: 164 msec    Systemic VTI:  0.17 m MR Peak grad:  2.2 mmHg     Systemic Diam: 1.90 cm MR Vmax:      73.70 cm/s MV E velocity: 74.10 cm/s MV A velocity: 83.20 cm/s MV E/A ratio:  0.89 Lennie Odor MD Electronically signed by Lennie Odor MD Signature Date/Time: 08/21/2023/7:35:47 PM    Final    DG Abd Portable 1V  Result Date: 08/21/2023 CLINICAL DATA:  A nasogastric feeding tube is seen with its  distal tip overlying the expected region of the duodenal bulb. EXAM: PORTABLE ABDOMEN - 1 VIEW COMPARISON:  None Available. FINDINGS: The bowel gas pattern is normal. No radio-opaque calculi are seen. Postoperative changes are noted within the thoracolumbar spine. IMPRESSION: Nasogastric feeding tube positioning, as described above. Electronically Signed   By: Aram Candela M.D.   On: 08/21/2023 17:11    Principal Problem:   Shock (HCC) Active Problems:   Protein-calorie malnutrition, severe     Doniqua Saxby Tublu Artist Bloom, [facility]  If 7PM-7AM, please contact night-coverage [email or web address]   LOS: 2 days

## 2023-08-22 NOTE — H&P (View-Only) (Signed)
 Progress Note   Subjective  Day #3 Chief Complaint:Upper GI Bleed, Sepsis  Today, patient found after returning from multiple imaging studies.  The sister is in the room and tells me that he has been talking to her but he does not actually verbalize anything to me.  Apparently has told her that he is tired and wants to drink.  He nods his head when asked about endoscopy.   Objective   Vital signs in last 24 hours: Temp:  [98 F (36.7 C)-99 F (37.2 C)] 98 F (36.7 C) (08/22 0725) Pulse Rate:  [91-111] 110 (08/22 0725) Resp:  [18-22] 20 (08/22 0725) BP: (94-108)/(56-75) 94/59 (08/22 0725) SpO2:  [97 %-100 %] 97 % (08/22 0725) Weight:  [47.7 kg] 47.7 kg (08/22 0539) Last BM Date : 08/21/23 General:    Chronically ill, cachectic, contractured African-American male in NAD Heart:  Regular rate and rhythm; no murmurs Lungs: Respirations even and unlabored, lungs CTA bilaterally Abdomen:  Soft, nontender and nondistended. Normal bowel sounds. Psych:  Cooperative.   Intake/Output from previous day: 08/21 0701 - 08/22 0700 In: 1180.8 [I.V.:395.3; NG/GT:29.2; IV Piggyback:756.4] Out: 1250 [Urine:1250]   Lab Results: Recent Labs    08/20/23 0857 08/20/23 1332 08/21/23 0607  WBC 19.1* 20.1* 19.4*  HGB 8.5* 9.1* 8.8*  HCT 28.3* 30.1* 28.9*  PLT 434* 450* 390   BMET Recent Labs    08/20/23 1332 08/20/23 2101 08/21/23 1030 08/22/23 0610  NA 137  --  142 134*  K 2.5* 2.9* 3.3* 3.1*  CL 105  --  105 103  CO2 24  --  25 21*  GLUCOSE 92  --  85 139*  BUN 9  --  <5* 5*  CREATININE 0.46*  --  0.67 0.45*  CALCIUM 7.7*  --  8.2* 7.5*   LFT Recent Labs    08/22/23 0610  PROT 6.0*  ALBUMIN 2.0*  AST 17  ALT 13  ALKPHOS 68  BILITOT 0.6   PT/INR Recent Labs    08/20/23 0857 08/21/23 0330  LABPROT 18.9* 19.9*  INR 1.6* 1.7*    Studies/Results: EEG adult  Result Date: 08/22/2023 Charlsie Quest, MD     08/22/2023  8:38 AM Patient Name: IKEEM SCHOENIG  MRN: [id number] Epilepsy Attending: Charlsie Quest Referring Physician/Provider: Talitha Givens, NP Date: 08/21/2023 Duration: 23.26 mins Patient history: 57yo M with h.o seizures now with ams getting eeg to evaluate for seizure. Level of alertness: Awake AEDs during EEG study: None Technical aspects: This EEG study was done with scalp electrodes positioned according to the 10-20 International system of electrode placement. Electrical activity was reviewed with band pass filter of 1-70Hz , sensitivity of 7 uV/mm, display speed of 35mm/sec with a 60Hz  notched filter applied as appropriate. EEG data were recorded continuously and digitally stored.  Video monitoring was available and reviewed as appropriate. Description: EEG showed continuous generalized 3 to 6 Hz theta-delta slowing.  Hyperventilation and photic stimulation were not performed.   Of note, study was technically difficult due to significant myogenic artifact. ABNORMALITY - Continuous slow, generalized IMPRESSION: This technically difficult study is suggestive of moderate diffuse encephalopathy. No seizures or epileptiform discharges were seen throughout the recording. Charlsie Quest   ECHOCARDIOGRAM COMPLETE  Result Date: 08/21/2023    ECHOCARDIOGRAM REPORT   Patient Name:   COBAIN KRIKORIAN Date of Exam: 08/21/2023 Medical Rec #:  [id number]         Height:  60.0 in Accession #:    [id number]        Weight:       111.1 lb Date of Birth:  Jun 24, 1966          BSA:          1.454 m Patient Age:    57 years          BP:           117/73 mmHg Patient Gender: M                 HR:           92 bpm. Exam Location:  Inpatient Procedure: 2D Echo, Cardiac Doppler and Color Doppler Indications:    Bacteremia R78.81  History:        Patient has no prior history of Echocardiogram examinations.                 Shock; Risk Factors:Former Smoker. Seizures.  Sonographer:    Aron Baba Referring Phys: [phone] WHITNEY D HARRIS  Sonographer Comments: No  subcostal window. Image acquisition challenging due to respiratory motion. IMPRESSIONS  1. Left ventricular ejection fraction, by estimation, is 55 to 60%. The left ventricle has normal function. The left ventricle has no regional wall motion abnormalities. Left ventricular diastolic parameters were normal.  2. Right ventricular systolic function is normal. The right ventricular size is normal. Tricuspid regurgitation signal is inadequate for assessing PA pressure.  3. The mitral valve is grossly normal. Trivial mitral valve regurgitation. No evidence of mitral stenosis.  4. Focal calium noted on the LCC. The aortic valve is tricuspid. Aortic valve regurgitation is not visualized. No aortic stenosis is present. Conclusion(s)/Recommendation(s): No evidence of valvular vegetations on this transthoracic echocardiogram. Consider a transesophageal echocardiogram to exclude infective endocarditis if clinically indicated. FINDINGS  Left Ventricle: Left ventricular ejection fraction, by estimation, is 55 to 60%. The left ventricle has normal function. The left ventricle has no regional wall motion abnormalities. The left ventricular internal cavity size was normal in size. There is  no left ventricular hypertrophy. Left ventricular diastolic parameters were normal. Right Ventricle: The right ventricular size is normal. No increase in right ventricular wall thickness. Right ventricular systolic function is normal. Tricuspid regurgitation signal is inadequate for assessing PA pressure. Left Atrium: Left atrial size was normal in size. Right Atrium: Right atrial size was normal in size. Pericardium: There is no evidence of pericardial effusion. Mitral Valve: The mitral valve is grossly normal. Trivial mitral valve regurgitation. No evidence of mitral valve stenosis. Tricuspid Valve: The tricuspid valve is grossly normal. Tricuspid valve regurgitation is not demonstrated. No evidence of tricuspid stenosis. Aortic Valve: Focal  calium noted on the LCC. The aortic valve is tricuspid. Aortic valve regurgitation is not visualized. No aortic stenosis is present. Pulmonic Valve: The pulmonic valve was grossly normal. Pulmonic valve regurgitation is not visualized. No evidence of pulmonic stenosis. Aorta: The aortic root is normal in size and structure. Venous: The inferior vena cava was not well visualized. IAS/Shunts: The atrial septum is grossly normal.  LEFT VENTRICLE PLAX 2D LVIDd:         4.20 cm   Diastology LVIDs:         3.00 cm   LV e' medial:    8.38 cm/s LV PW:         0.60 cm   LV E/e' medial:  8.8 LV IVS:        0.50 cm  LV e' lateral:   10.90 cm/s LVOT diam:     1.90 cm   LV E/e' lateral: 6.8 LV SV:         48 LV SV Index:   33 LVOT Area:     2.84 cm  RIGHT VENTRICLE RV S prime:     11.80 cm/s TAPSE (M-mode): 1.6 cm LEFT ATRIUM           Index        RIGHT ATRIUM          Index LA diam:      3.30 cm 2.27 cm/m   RA Area:     9.96 cm LA Vol (A2C): 13.2 ml 9.08 ml/m   RA Volume:   16.60 ml 11.42 ml/m LA Vol (A4C): 27.3 ml 18.78 ml/m  AORTIC VALVE LVOT Vmax:   87.70 cm/s LVOT Vmean:  60.200 cm/s LVOT VTI:    0.170 m  AORTA Ao Root diam: 3.20 cm Ao Asc diam:  2.60 cm MITRAL VALVE MV Area (PHT): 4.63 cm    SHUNTS MV Decel Time: 164 msec    Systemic VTI:  0.17 m MR Peak grad: 2.2 mmHg     Systemic Diam: 1.90 cm MR Vmax:      73.70 cm/s MV E velocity: 74.10 cm/s MV A velocity: 83.20 cm/s MV E/A ratio:  0.89 Lennie Odor MD Electronically signed by Lennie Odor MD Signature Date/Time: 08/21/2023/7:35:47 PM    Final    DG Abd Portable 1V  Result Date: 08/21/2023 CLINICAL DATA:  A nasogastric feeding tube is seen with its distal tip overlying the expected region of the duodenal bulb. EXAM: PORTABLE ABDOMEN - 1 VIEW COMPARISON:  None Available. FINDINGS: The bowel gas pattern is normal. No radio-opaque calculi are seen. Postoperative changes are noted within the thoracolumbar spine. IMPRESSION: Nasogastric feeding tube  positioning, as described above. Electronically Signed   By: Aram Candela M.D.   On: 08/21/2023 17:11       Assessment / Plan:   Assessment: 1.  Hematemesis with acute on chronic anemia: Hemoglobin 4.5--> 8.5 after 3 units PRBCs--> 8.8, not drawn today, previous workup for anemia by [facility] 2015 had EGD and colonoscopy showing hiatal hernia and ulcerative esophagitis; suspect similar findings now 2.  Aphasia: Per sister he has been able to talk over the last 12 hours at least while she has been in the room 3.  Sepsis 4.  Functional quadriplegia 5.  Multiple pressure ulcers 6.  Hypokalemia 7.  Peripheral protein calorie malnutrition  Plan: 1.  Continue supportive measures 2.  Will go ahead and plan for EGD tomorrow as patient is certainly looking better and off pressors, hypokalemia almost corrected.  Did review risks, benefits, limitation and alternatives with the patient and his sister who is in the room and who is his POA.  Consent will need to be signed by her. 3.  Continue current diet until midnight at that time patient will be n.p.o.  Thank you for your kind consultation, we will continue to follow along.   LOS: 2 days   Unk Lightning  08/22/2023, 2:53 PM  I have taken an interval history, thoroughly reviewed the chart and examined the patient. I agree with the Advanced Practitioner's note, impression and recommendations, and have recorded additional findings, impressions and recommendations below. I performed a substantive portion of this encounter (>50% time spent), including a complete performance of the medical decision making.  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 Clinically stable, now off pressors and out of the ICU.  Upper endoscopy planned for tomorrow, further recommendations to follow.  Patient at increased risk for cardiopulmonary complications of procedure  due to medical comorbidities.    [name] [phone]

## 2023-08-22 NOTE — TOC Initial Note (Signed)
Transition of Care [facility]) - Initial/Assessment Note    Patient Details  Name: Ian Hurley MRN: [id number] Date of Birth: 1966-04-12  Transition of Care [facility] CM/SW Contact:    Leone Haven, RN Phone Number: 08/22/2023, 4:33 PM  Clinical Narrative:                 From [facility], per the administrator there he has [facility] for wound care with [facility] twice a week, states patient has been eating by mouth feeding himself and sometimes staff feeds him.  This NCM was contacted by [name] at [facility] stating patient meets [facility] criteria with his wounds stage 3 and stage 4.  NCM informed [name] that this NCM will have to discuss with patient sister, who is his legal guardian, Clydie Braun [phone].  NCM spoke with Clydie Braun regarding the two [facility] we have, [facility] and [facility].  She states she thinks she would like for him to go to [facility] since they said he meets their criteria.  NCM notified MD that legal guardian would like patient to go to [facility] , [facility] when patient is medically ready. Clydie Braun would like for [name] to call her between 8:30 and 10 am tomorrow morning when she is free, she is a Engineer, site.  [name] states she will call her at this time.   Expected Discharge Plan: Long Term Acute Care (LTAC) Barriers to Discharge: Continued Medical Work up   Patient Goals and CMS Choice Patient states their goals for this hospitalization and ongoing recovery are:: sister states to go to LTAC to get well CMS Medicare.gov Compare Post Acute Care list provided to:: Patient Represenative (must comment) Choice offered to / list presented to : Adult Children      Expected Discharge Plan and Services In-house Referral: NA Discharge Planning Services: CM Consult Post Acute Care Choice: Long Term Acute Care (LTAC) Living arrangements for the past 2 months: Group Home                 DME Arranged: N/A DME Agency: NA       HH Arranged: NA          Prior Living  Arrangements/Services Living arrangements for the past 2 months: Group Home Lives with:: Facility Resident Patient language and need for interpreter reviewed:: Yes Do you feel safe going back to the place where you live?: Yes      Need for Family Participation in Patient Care: Yes (Comment) Care giver support system in place?: Yes (comment) Current home services: Home RN ([facility]) Criminal Activity/Legal Involvement Pertinent to Current Situation/Hospitalization: No - Comment as needed  Activities of Daily Living      Permission Sought/Granted Permission sought to share information with : Case Manager Permission granted to share information with : Yes, Verbal Permission Granted  Share Information with NAME: Clydie Braun  Permission granted to share info w AGENCY: [facility]  Permission granted to share info w Relationship: Sister     Emotional Assessment Appearance:: Appears stated age Attitude/Demeanor/Rapport:  (Accepting) Affect (typically observed): Accepting Orientation: : Oriented to Self, Oriented to Place, Oriented to  Time, Oriented to Situation   Psych Involvement: No (comment)  Admission diagnosis:  Shock (HCC) [R57.9] Acute encephalopathy [G93.40] Septic shock (HCC) [A41.9, R65.21] Severe anemia [D64.9] Patient Active Problem List   Diagnosis Date Noted   Hematemesis with nausea 08/22/2023   Anemia due to chronic blood loss 08/22/2023   Protein-calorie malnutrition, severe 08/21/2023   Shock (HCC) 08/20/2023  Pressure ulcer 09/13/2016   Sepsis (HCC) 09/12/2016   Constipation 07/19/2015   Polycythemia 05/17/2015   Anemia 05/21/2014   Seizure disorder (HCC) 05/21/2014   Anxiety 05/21/2014   Cerebral palsy (HCC) 05/21/2014   Depression 05/21/2014   PCP:  Benetta Spar, MD Pharmacy:   [facility], [place] - [address] [place] [address] Phone: [phone] Fax: [phone]     Social Determinants of Health  (SDOH) Social History: SDOH Screenings   Tobacco Use: Medium Risk (08/19/2023)   SDOH Interventions:     Readmission Risk Interventions     No data to display

## 2023-08-22 NOTE — Plan of Care (Signed)
  Problem: Education: Goal: Knowledge of General Education information will improve Description: Including pain rating scale, medication(s)/side effects and non-pharmacologic comfort measures Outcome: Progressing   Problem: Clinical Measurements: Goal: Ability to maintain clinical measurements within normal limits will improve Outcome: Progressing Goal: Will remain free from infection Outcome: Progressing Goal: Diagnostic test results will improve Outcome: Progressing Goal: Respiratory complications will improve Outcome: Progressing Goal: Cardiovascular complication will be avoided Outcome: Progressing   Problem: Activity: Goal: Risk for activity intolerance will decrease Outcome: Progressing   Problem: Nutrition: Goal: Adequate nutrition will be maintained Outcome: Progressing   Problem: Coping: Goal: Level of anxiety will decrease Outcome: Progressing   Problem: Elimination: Goal: Will not experience complications related to bowel motility Outcome: Progressing Goal: Will not experience complications related to urinary retention Outcome: Progressing   Problem: Pain Managment: Goal: General experience of comfort will improve Outcome: Progressing   Problem: Safety: Goal: Ability to remain free from injury will improve Outcome: Progressing   Problem: Skin Integrity: Goal: Risk for impaired skin integrity will decrease Outcome: Progressing   Problem: Education: Goal: Ability to describe self-care measures that may prevent or decrease complications (Diabetes Survival Skills Education) will improve Outcome: Progressing Goal: Individualized Educational Video(s) Outcome: Progressing   Problem: Coping: Goal: Ability to adjust to condition or change in health will improve Outcome: Progressing   Problem: Nutritional: Goal: Maintenance of adequate nutrition will improve Outcome: Progressing   Problem: Skin Integrity: Goal: Risk for impaired skin integrity will  decrease Outcome: Progressing

## 2023-08-22 NOTE — Procedures (Signed)
Patient Name: Ian Hurley  MRN: [id number]  Epilepsy Attending: Charlsie Quest  Referring Physician/Provider: Talitha Givens, NP  Date: 08/21/2023 Duration: 23.26 mins  Patient history: 57yo M with h.o seizures now with ams getting eeg to evaluate for seizure.  Level of alertness: Awake  AEDs during EEG study: None  Technical aspects: This EEG study was done with scalp electrodes positioned according to the 10-20 International system of electrode placement. Electrical activity was reviewed with band pass filter of 1-70Hz , sensitivity of 7 uV/mm, display speed of 69mm/sec with a 60Hz  notched filter applied as appropriate. EEG data were recorded continuously and digitally stored.  Video monitoring was available and reviewed as appropriate.  Description: EEG showed continuous generalized 3 to 6 Hz theta-delta slowing.  Hyperventilation and photic stimulation were not performed.     Of note, study was technically difficult due to significant myogenic artifact.   ABNORMALITY - Continuous slow, generalized  IMPRESSION: This technically difficult study is suggestive of moderate diffuse encephalopathy. No seizures or epileptiform discharges were seen throughout the recording.  Polly Barner Annabelle Harman

## 2023-08-22 NOTE — Progress Notes (Addendum)
Progress Note   Subjective  Day #3 Chief Complaint:Upper GI Bleed, Sepsis  Today, patient found after returning from multiple imaging studies.  The sister is in the room and tells me that he has been talking to her but he does not actually verbalize anything to me.  Apparently has told her that he is tired and wants to drink.  He nods his head when asked about endoscopy.   Objective   Vital signs in last 24 hours: Temp:  [98 F (36.7 C)-99 F (37.2 C)] 98 F (36.7 C) (08/22 0725) Pulse Rate:  [91-111] 110 (08/22 0725) Resp:  [18-22] 20 (08/22 0725) BP: (94-108)/(56-75) 94/59 (08/22 0725) SpO2:  [97 %-100 %] 97 % (08/22 0725) Weight:  [47.7 kg] 47.7 kg (08/22 0539) Last BM Date : 08/21/23 General:    Chronically ill, cachectic, contractured African-American male in NAD Heart:  Regular rate and rhythm; no murmurs Lungs: Respirations even and unlabored, lungs CTA bilaterally Abdomen:  Soft, nontender and nondistended. Normal bowel sounds. Psych:  Cooperative.   Intake/Output from previous day: 08/21 0701 - 08/22 0700 In: 1180.8 [I.V.:395.3; NG/GT:29.2; IV Piggyback:756.4] Out: 1250 [Urine:1250]   Lab Results: Recent Labs    08/20/23 0857 08/20/23 1332 08/21/23 0607  WBC 19.1* 20.1* 19.4*  HGB 8.5* 9.1* 8.8*  HCT 28.3* 30.1* 28.9*  PLT 434* 450* 390   BMET Recent Labs    08/20/23 1332 08/20/23 2101 08/21/23 1030 08/22/23 0610  NA 137  --  142 134*  K 2.5* 2.9* 3.3* 3.1*  CL 105  --  105 103  CO2 24  --  25 21*  GLUCOSE 92  --  85 139*  BUN 9  --  <5* 5*  CREATININE 0.46*  --  0.67 0.45*  CALCIUM 7.7*  --  8.2* 7.5*   LFT Recent Labs    08/22/23 0610  PROT 6.0*  ALBUMIN 2.0*  AST 17  ALT 13  ALKPHOS 68  BILITOT 0.6   PT/INR Recent Labs    08/20/23 0857 08/21/23 0330  LABPROT 18.9* 19.9*  INR 1.6* 1.7*    Studies/Results: EEG adult  Result Date: 08/22/2023 Charlsie Quest, MD     08/22/2023  8:38 AM Patient Name: Ian Hurley  MRN: [id number] Epilepsy Attending: Charlsie Quest Referring Physician/Provider: Talitha Givens, NP Date: 08/21/2023 Duration: 23.26 mins Patient history: 57yo M with h.o seizures now with ams getting eeg to evaluate for seizure. Level of alertness: Awake AEDs during EEG study: None Technical aspects: This EEG study was done with scalp electrodes positioned according to the 10-20 International system of electrode placement. Electrical activity was reviewed with band pass filter of 1-70Hz , sensitivity of 7 uV/mm, display speed of 35mm/sec with a 60Hz  notched filter applied as appropriate. EEG data were recorded continuously and digitally stored.  Video monitoring was available and reviewed as appropriate. Description: EEG showed continuous generalized 3 to 6 Hz theta-delta slowing.  Hyperventilation and photic stimulation were not performed.   Of note, study was technically difficult due to significant myogenic artifact. ABNORMALITY - Continuous slow, generalized IMPRESSION: This technically difficult study is suggestive of moderate diffuse encephalopathy. No seizures or epileptiform discharges were seen throughout the recording. Charlsie Quest   ECHOCARDIOGRAM COMPLETE  Result Date: 08/21/2023    ECHOCARDIOGRAM REPORT   Patient Name:   COBAIN KRIKORIAN Date of Exam: 08/21/2023 Medical Rec #:  [id number]         Height:  60.0 in Accession #:    [id number]        Weight:       111.1 lb Date of Birth:  Jun 24, 1966          BSA:          1.454 m Patient Age:    57 years          BP:           117/73 mmHg Patient Gender: M                 HR:           92 bpm. Exam Location:  Inpatient Procedure: 2D Echo, Cardiac Doppler and Color Doppler Indications:    Bacteremia R78.81  History:        Patient has no prior history of Echocardiogram examinations.                 Shock; Risk Factors:Former Smoker. Seizures.  Sonographer:    Aron Baba Referring Phys: [phone] WHITNEY D HARRIS  Sonographer Comments: No  subcostal window. Image acquisition challenging due to respiratory motion. IMPRESSIONS  1. Left ventricular ejection fraction, by estimation, is 55 to 60%. The left ventricle has normal function. The left ventricle has no regional wall motion abnormalities. Left ventricular diastolic parameters were normal.  2. Right ventricular systolic function is normal. The right ventricular size is normal. Tricuspid regurgitation signal is inadequate for assessing PA pressure.  3. The mitral valve is grossly normal. Trivial mitral valve regurgitation. No evidence of mitral stenosis.  4. Focal calium noted on the LCC. The aortic valve is tricuspid. Aortic valve regurgitation is not visualized. No aortic stenosis is present. Conclusion(s)/Recommendation(s): No evidence of valvular vegetations on this transthoracic echocardiogram. Consider a transesophageal echocardiogram to exclude infective endocarditis if clinically indicated. FINDINGS  Left Ventricle: Left ventricular ejection fraction, by estimation, is 55 to 60%. The left ventricle has normal function. The left ventricle has no regional wall motion abnormalities. The left ventricular internal cavity size was normal in size. There is  no left ventricular hypertrophy. Left ventricular diastolic parameters were normal. Right Ventricle: The right ventricular size is normal. No increase in right ventricular wall thickness. Right ventricular systolic function is normal. Tricuspid regurgitation signal is inadequate for assessing PA pressure. Left Atrium: Left atrial size was normal in size. Right Atrium: Right atrial size was normal in size. Pericardium: There is no evidence of pericardial effusion. Mitral Valve: The mitral valve is grossly normal. Trivial mitral valve regurgitation. No evidence of mitral valve stenosis. Tricuspid Valve: The tricuspid valve is grossly normal. Tricuspid valve regurgitation is not demonstrated. No evidence of tricuspid stenosis. Aortic Valve: Focal  calium noted on the LCC. The aortic valve is tricuspid. Aortic valve regurgitation is not visualized. No aortic stenosis is present. Pulmonic Valve: The pulmonic valve was grossly normal. Pulmonic valve regurgitation is not visualized. No evidence of pulmonic stenosis. Aorta: The aortic root is normal in size and structure. Venous: The inferior vena cava was not well visualized. IAS/Shunts: The atrial septum is grossly normal.  LEFT VENTRICLE PLAX 2D LVIDd:         4.20 cm   Diastology LVIDs:         3.00 cm   LV e' medial:    8.38 cm/s LV PW:         0.60 cm   LV E/e' medial:  8.8 LV IVS:        0.50 cm  LV e' lateral:   10.90 cm/s LVOT diam:     1.90 cm   LV E/e' lateral: 6.8 LV SV:         48 LV SV Index:   33 LVOT Area:     2.84 cm  RIGHT VENTRICLE RV S prime:     11.80 cm/s TAPSE (M-mode): 1.6 cm LEFT ATRIUM           Index        RIGHT ATRIUM          Index LA diam:      3.30 cm 2.27 cm/m   RA Area:     9.96 cm LA Vol (A2C): 13.2 ml 9.08 ml/m   RA Volume:   16.60 ml 11.42 ml/m LA Vol (A4C): 27.3 ml 18.78 ml/m  AORTIC VALVE LVOT Vmax:   87.70 cm/s LVOT Vmean:  60.200 cm/s LVOT VTI:    0.170 m  AORTA Ao Root diam: 3.20 cm Ao Asc diam:  2.60 cm MITRAL VALVE MV Area (PHT): 4.63 cm    SHUNTS MV Decel Time: 164 msec    Systemic VTI:  0.17 m MR Peak grad: 2.2 mmHg     Systemic Diam: 1.90 cm MR Vmax:      73.70 cm/s MV E velocity: 74.10 cm/s MV A velocity: 83.20 cm/s MV E/A ratio:  0.89 Lennie Odor MD Electronically signed by Lennie Odor MD Signature Date/Time: 08/21/2023/7:35:47 PM    Final    DG Abd Portable 1V  Result Date: 08/21/2023 CLINICAL DATA:  A nasogastric feeding tube is seen with its distal tip overlying the expected region of the duodenal bulb. EXAM: PORTABLE ABDOMEN - 1 VIEW COMPARISON:  None Available. FINDINGS: The bowel gas pattern is normal. No radio-opaque calculi are seen. Postoperative changes are noted within the thoracolumbar spine. IMPRESSION: Nasogastric feeding tube  positioning, as described above. Electronically Signed   By: Aram Candela M.D.   On: 08/21/2023 17:11       Assessment / Plan:   Assessment: 1.  Hematemesis with acute on chronic anemia: Hemoglobin 4.5--> 8.5 after 3 units PRBCs--> 8.8, not drawn today, previous workup for anemia by [facility] 2015 had EGD and colonoscopy showing hiatal hernia and ulcerative esophagitis; suspect similar findings now 2.  Aphasia: Per sister he has been able to talk over the last 12 hours at least while she has been in the room 3.  Sepsis 4.  Functional quadriplegia 5.  Multiple pressure ulcers 6.  Hypokalemia 7.  Peripheral protein calorie malnutrition  Plan: 1.  Continue supportive measures 2.  Will go ahead and plan for EGD tomorrow as patient is certainly looking better and off pressors, hypokalemia almost corrected.  Did review risks, benefits, limitation and alternatives with the patient and his sister who is in the room and who is his POA.  Consent will need to be signed by her. 3.  Continue current diet until midnight at that time patient will be n.p.o.  Thank you for your kind consultation, we will continue to follow along.   LOS: 2 days   Unk Lightning  08/22/2023, 2:53 PM  I have taken an interval history, thoroughly reviewed the chart and examined the patient. I agree with the Advanced Practitioner's note, impression and recommendations, and have recorded additional findings, impressions and recommendations below. I performed a substantive portion of this encounter (>50% time spent), including a complete performance of the medical decision making.  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 Clinically stable, now off pressors and out of the ICU.  Upper endoscopy planned for tomorrow, further recommendations to follow.  Patient at increased risk for cardiopulmonary complications of procedure  due to medical comorbidities.    [name] [phone]

## 2023-08-22 NOTE — Progress Notes (Signed)
Nutrition Brief Note  Attempted to check in with patient. Pt off unit for MRI/CT per RN.   Tube feeding initiated yesterday. Unfortunately, tube feeding was started at goal rate (Osmolite 1.2 at 55ml/hr). Pt noted to be refeeding per review of labs.    Potassium 3.1- repletion ordered Phos 1.8 Mg 1.4  Adjusted tube feeding pump down to 30ml/hr and notified RN. Discussed advancement recommendations with RN. Advised to advance Osmolite 1.2 by 10ml q12h to a goal rate of 62ml/hr pending stability in labs.   Reached out to MD regarding repletion of Phos and Mg.   PO intake remains minimal. RN reported pt had only eaten a couple bites and sips this morning. Will continue to follow and monitor diet and tube feeding tolerance, as well as refeeding labs.   Drusilla Kanner, RDN, LDN Clinical Nutrition

## 2023-08-23 ENCOUNTER — Inpatient Hospital Stay (HOSPITAL_COMMUNITY): Payer: Medicare Other

## 2023-08-23 ENCOUNTER — Inpatient Hospital Stay (HOSPITAL_COMMUNITY): Payer: Medicare Other | Admitting: Anesthesiology

## 2023-08-23 ENCOUNTER — Encounter (HOSPITAL_COMMUNITY): Payer: Self-pay | Admitting: Internal Medicine

## 2023-08-23 ENCOUNTER — Encounter (HOSPITAL_COMMUNITY)
Admission: EM | Disposition: A | Discharge status: Nursing Home (Includes ICF) | Payer: Self-pay | Source: Home / Self Care | Attending: Internal Medicine

## 2023-08-23 DIAGNOSIS — R579 Shock, unspecified: Secondary | ICD-10-CM | POA: Diagnosis not present

## 2023-08-23 DIAGNOSIS — I10 Essential (primary) hypertension: Secondary | ICD-10-CM | POA: Diagnosis not present

## 2023-08-23 DIAGNOSIS — K449 Diaphragmatic hernia without obstruction or gangrene: Secondary | ICD-10-CM

## 2023-08-23 DIAGNOSIS — K920 Hematemesis: Secondary | ICD-10-CM | POA: Diagnosis not present

## 2023-08-23 DIAGNOSIS — R7881 Bacteremia: Secondary | ICD-10-CM

## 2023-08-23 DIAGNOSIS — R569 Unspecified convulsions: Secondary | ICD-10-CM | POA: Diagnosis not present

## 2023-08-23 DIAGNOSIS — D500 Iron deficiency anemia secondary to blood loss (chronic): Secondary | ICD-10-CM

## 2023-08-23 DIAGNOSIS — D638 Anemia in other chronic diseases classified elsewhere: Secondary | ICD-10-CM | POA: Diagnosis not present

## 2023-08-23 DIAGNOSIS — E785 Hyperlipidemia, unspecified: Secondary | ICD-10-CM

## 2023-08-23 DIAGNOSIS — F418 Other specified anxiety disorders: Secondary | ICD-10-CM

## 2023-08-23 DIAGNOSIS — E43 Unspecified severe protein-calorie malnutrition: Secondary | ICD-10-CM

## 2023-08-23 HISTORY — PX: ESOPHAGOGASTRODUODENOSCOPY (EGD) WITH PROPOFOL: SHX5813

## 2023-08-23 LAB — CBC WITH DIFFERENTIAL/PLATELET
Abs Immature Granulocytes: 0 10*3/uL (ref 0.00–0.07)
Basophils Absolute: 0 10*3/uL (ref 0.0–0.1)
Basophils Relative: 0 %
Eosinophils Absolute: 0.3 10*3/uL (ref 0.0–0.5)
Eosinophils Relative: 1 %
HCT: 27.3 % — ABNORMAL LOW (ref 39.0–52.0)
Hemoglobin: 8.1 g/dL — ABNORMAL LOW (ref 13.0–17.0)
Lymphocytes Relative: 2 %
Lymphs Abs: 0.6 10*3/uL — ABNORMAL LOW (ref 0.7–4.0)
MCH: 21.9 pg — ABNORMAL LOW (ref 26.0–34.0)
MCHC: 29.7 g/dL — ABNORMAL LOW (ref 30.0–36.0)
MCV: 73.8 fL — ABNORMAL LOW (ref 80.0–100.0)
Monocytes Absolute: 0.6 10*3/uL (ref 0.1–1.0)
Monocytes Relative: 2 %
Neutro Abs: 26.2 10*3/uL — ABNORMAL HIGH (ref 1.7–7.7)
Neutrophils Relative %: 95 %
Platelets: 311 10*3/uL (ref 150–400)
RBC: 3.7 MIL/uL — ABNORMAL LOW (ref 4.22–5.81)
WBC: 27.6 10*3/uL — ABNORMAL HIGH (ref 4.0–10.5)
nRBC: 0 /100{WBCs}
nRBC: 0.1 % (ref 0.0–0.2)

## 2023-08-23 LAB — BASIC METABOLIC PANEL
Anion gap: 10 (ref 5–15)
BUN: <5 mg/dL — ABNORMAL LOW (ref 6–20)
CO2: 21 mmol/L — ABNORMAL LOW (ref 22–32)
Calcium: 7.3 mg/dL — ABNORMAL LOW (ref 8.9–10.3)
Chloride: 101 mmol/L (ref 98–111)
Creatinine, Ser: 0.35 mg/dL — ABNORMAL LOW (ref 0.61–1.24)
GFR, Estimated: >60 mL/min (ref >60)
Glucose, Bld: 63 mg/dL — ABNORMAL LOW (ref 70–99)
Potassium: 3.4 mmol/L — ABNORMAL LOW (ref 3.5–5.1)
Sodium: 132 mmol/L — ABNORMAL LOW (ref 135–145)

## 2023-08-23 LAB — GLUCOSE, CAPILLARY
Glucose-Capillary: 101 mg/dL — ABNORMAL HIGH (ref 70–99)
Glucose-Capillary: 108 mg/dL — ABNORMAL HIGH (ref 70–99)
Glucose-Capillary: 109 mg/dL — ABNORMAL HIGH (ref 70–99)
Glucose-Capillary: 67 mg/dL — ABNORMAL LOW (ref 70–99)
Glucose-Capillary: 71 mg/dL (ref 70–99)
Glucose-Capillary: 72 mg/dL (ref 70–99)
Glucose-Capillary: 75 mg/dL (ref 70–99)
Glucose-Capillary: 84 mg/dL (ref 70–99)
Glucose-Capillary: 99 mg/dL (ref 70–99)

## 2023-08-23 LAB — PHOSPHORUS: Phosphorus: 2.7 mg/dL (ref 2.5–4.6)

## 2023-08-23 LAB — MAGNESIUM: Magnesium: 2.3 mg/dL (ref 1.7–2.4)

## 2023-08-23 SURGERY — ESOPHAGOGASTRODUODENOSCOPY (EGD) WITH PROPOFOL
Anesthesia: Monitor Anesthesia Care

## 2023-08-23 MED ORDER — LACTATED RINGERS IV SOLN
INTRAVENOUS | Status: AC | PRN
  Administered 2023-08-23: 10 mL/h via INTRAVENOUS

## 2023-08-23 MED ORDER — PROPOFOL 10 MG/ML IV BOLUS
INTRAVENOUS | Status: DC | PRN
  Administered 2023-08-23: 30 mg via INTRAVENOUS

## 2023-08-23 MED ORDER — DEXTROSE 50 % IV SOLN
12.5000 g | INTRAVENOUS | Status: AC
  Administered 2023-08-23: 12.5 g via INTRAVENOUS

## 2023-08-23 MED ORDER — PHENYLEPHRINE 80 MCG/ML (10ML) SYRINGE FOR IV PUSH (FOR BLOOD PRESSURE SUPPORT)
PREFILLED_SYRINGE | INTRAVENOUS | Status: DC | PRN
  Administered 2023-08-23 (×2): 160 ug via INTRAVENOUS
  Administered 2023-08-23: 80 ug via INTRAVENOUS
  Administered 2023-08-23 (×2): 160 ug via INTRAVENOUS

## 2023-08-23 MED ORDER — PROPOFOL 500 MG/50ML IV EMUL
INTRAVENOUS | Status: DC | PRN
  Administered 2023-08-23: 125 ug/kg/min via INTRAVENOUS

## 2023-08-23 MED ORDER — OSMOLITE 1.2 CAL PO LIQD
1000.0000 mL | ORAL | Status: DC
  Administered 2023-08-23: 1000 mL
  Filled 2023-08-23 (×2): qty 1000

## 2023-08-23 MED ORDER — DEXTROSE 50 % IV SOLN
INTRAVENOUS | Status: AC
  Filled 2023-08-23: qty 50

## 2023-08-23 MED ORDER — SODIUM CHLORIDE 0.9 % IV SOLN: INTRAVENOUS | Status: DC

## 2023-08-23 MED ORDER — LIDOCAINE 2% (20 MG/ML) 5 ML SYRINGE
INTRAMUSCULAR | Status: DC | PRN
  Administered 2023-08-23: 50 mg via INTRAVENOUS

## 2023-08-23 SURGICAL SUPPLY — 15 items

## 2023-08-23 NOTE — Op Note (Signed)
[facility] Patient Name: Ian Hurley Procedure Date : 08/23/2023 MRN: [id number] Attending MD: Starr Lake. Myrtie Neither , MD, [id number] Date of Birth: 1966/10/10 CSN: [id number] Age: 57 Admit Type: Inpatient Procedure:                Upper GI endoscopy Indications:              Iron deficiency anemia secondary to chronic blood                            loss, Hematemesis (unclear reports, possible                            hemoptysis or nasopharyngeal source. Not ongoing) Providers:                Sherilyn Cooter L. Myrtie Neither, MD, Geralyn Corwin, RN, Marja Kays, Technician Referring MD:             [facility] hospitalist Medicines:                Monitored Anesthesia Care Complications:            No immediate complications. Estimated Blood Loss:     Estimated blood loss: none. Procedure:                Pre-Anesthesia Assessment:                           - Prior to the procedure, a History and Physical                            was performed, and patient medications and                            allergies were reviewed. The patient's tolerance of                            previous anesthesia was also reviewed. The risks                            and benefits of the procedure and the sedation                            options and risks were discussed with the patient.                            All questions were answered, and informed consent                            was obtained. Prior Anticoagulants: The patient has                            taken no anticoagulant or antiplatelet agents. ASA  Grade Assessment: IV - A patient with severe                            systemic disease that is a constant threat to life.                            After reviewing the risks and benefits, the patient                            was deemed in satisfactory condition to undergo the                            procedure.                            After obtaining informed consent, the endoscope was                            passed under direct vision. Throughout the                            procedure, the patient's blood pressure, pulse, and                            oxygen saturations were monitored continuously. The                            [id number]-H190 ([id number]) Olympus endoscope was introduced                            through the mouth, and advanced to the second part                            of duodenum. The upper GI endoscopy was                            accomplished without difficulty. The patient                            tolerated the procedure well. Scope In: Scope Out: Findings:      The larynx was normal.      An 8-10 cm hiatal hernia was present.      The stomach was normal. Specifically, no erosions or ulcerations were       seen within the hiatal hernia sac and or elsewhere in the stomach.      The cardia and gastric fundus were normal on retroflexion.      The examined duodenum was normal. Impression:               - Normal larynx.                           - 8-10 cm hiatal hernia.                           -  Normal stomach.                           - Normal examined duodenum.                           - No specimens collected.                           No source of upper GI blood loss seen.                           Note that after the procedure, further physical                            exam was done and discovered this patient had a                            large amount of muddy brown stool, though if there                            is no overt GI bleeding at present. He is also                            noted to have multiple sacral and buttock ulcers,                            bandages for which are partially coming off. The                            ulcers and surrounding tissue are raw, suggesting                            there is a source of possible chronic blood loss                             here as well. Recommendation:           - Return patient to hospital ward for ongoing care.                           - Resume tube feeds after a new core track tube is                            placed.                           - Continue present medications.                           - Sister updated, and we will have ongoing                            discussions with family, but consideration should  be given to colonoscopy for further evaluation of                            this patient's multifactorial anemia that includes                            iron deficiency. Flexible sigmoidoscopy with poor                            preparation during a workup for anemia in 2015 at                            an outside GI practice.                           While that procedure & the preparation will be                            technically challenging due to the patient's body                            habitus,contracture and medical condition,his colon                            has never been fully evaluated.                           Perhaps not as an inpatient, but eventual                            consideration should be given to surgical placement                            of a gastrostomy tube. While understandably                            technically challenging given his body habitus, it                            would not only reduce his hiatal hernia to decrease                            the chance of aspiration in this high risk patient,                            it would provide a consistent source of adequate                            enteral nutrition for his severe protein calorie                            malnutrition.  Please see above, this patient's anemia is in large                            part related to malnutrition, chronic                            decubitus/buttock  ulcers. Procedure Code(s):        --- Professional ---                           [phone], Esophagogastroduodenoscopy, flexible,                            transoral; diagnostic, including collection of                            specimen(s) by brushing or washing, when performed                            (separate procedure) Diagnosis Code(s):        --- Professional ---                           K44.9, Diaphragmatic hernia without obstruction or                            gangrene                           D50.0, Iron deficiency anemia secondary to blood                            loss (chronic)                           K92.0, Hematemesis CPT copyright 2022 [employer]. All rights reserved. The codes documented in this report are preliminary and upon coder review may  be revised to meet current compliance requirements. Wendel Homeyer L. Myrtie Neither, MD 08/23/2023 10:44:27 AM This report has been signed electronically. Number of Addenda: 0

## 2023-08-23 NOTE — Progress Notes (Signed)
   08/23/23 0500  Assess: MEWS Score  Temp 98.7 F (37.1 C) (temp rechecked)  Pulse Rate (!) 107  Resp (!) 22  Assess: MEWS Score  MEWS Temp 0  MEWS Systolic 1  MEWS Pulse 1  MEWS RR 1  MEWS LOC 0  MEWS Score 3  MEWS Score Color Yellow  Assess: SIRS CRITERIA  SIRS Temperature  0  SIRS Pulse 1  SIRS Respirations  1  SIRS WBC 0  SIRS Score Sum  2

## 2023-08-23 NOTE — Progress Notes (Signed)
   08/22/23 2047  Assess: MEWS Score  BP 106/63  MAP (mmHg) 75  Pulse Rate 100  ECG Heart Rate 100  Resp (!) 22  SpO2 100 %  O2 Device Room Air  Assess: MEWS Score  MEWS Temp 0  MEWS Systolic 0  MEWS Pulse 0  MEWS RR 1  MEWS LOC 0  MEWS Score 1  MEWS Score Color Green  Assess: SIRS CRITERIA  SIRS Temperature  0  SIRS Pulse 1  SIRS Respirations  1  SIRS WBC 0  SIRS Score Sum  2

## 2023-08-23 NOTE — Anesthesia Postprocedure Evaluation (Signed)
Anesthesia Post Note  Patient: Ian Hurley  Procedure(s) Performed: ESOPHAGOGASTRODUODENOSCOPY (EGD) WITH PROPOFOL     Patient location during evaluation: PACU Anesthesia Type: MAC Level of consciousness: awake Pain management: pain level controlled Vital Signs Assessment: post-procedure vital signs reviewed and stable Respiratory status: spontaneous breathing, nonlabored ventilation and respiratory function stable Cardiovascular status: stable and blood pressure returned to baseline Postop Assessment: no apparent nausea or vomiting Anesthetic complications: no   No notable events documented.  Last Vitals:  Vitals:   08/23/23 1050 08/23/23 1100  BP: (!) 97/57 101/62  Pulse: 89 92  Resp: (!) 23 20  Temp:    SpO2: 99% 99%    Last Pain:  Vitals:   08/23/23 1100  TempSrc:   PainSc: 0-No pain                 Linton Rump

## 2023-08-23 NOTE — Progress Notes (Signed)
PROGRESS NOTE    Ian Hurley  ZOX:[id number] DOB: 04-Jan-1966 DOA: 08/19/2023 PCP: Benetta Spar, MD   Brief Narrative:  The patient is a 57 year old man w/ hx of cerebral palsy functional quadriplegic, prior erosive gastritis, chronic pressure injuries presenting with altered mental status and aphasia.  Workup revealed hemoglobin 4, MSSA bacteremia.  Stroke was ruled out by imaging and neurology felt "aphasia" was secondary to metabolic encephalopathy.  Patient admitted to ICU on pressor with improvement.  Now transferred out to floor.  He has admitted to having had hematemesis lately and GI Consulted and did EGD and showed no erosions or ulcerations within the hiatal hernia sac or in the stom and no source of upper GI Blood loss found.  Current plan is for TTE on Monday continuing cefazolin.  Patient's central venous catheters removed at this time.  NG tube is back in place now to have the patient receive Cortrak TF in addition to dysphagia 1 diet.  Assessment and Plan:  MSSA bacteremia Stage III and IV pressure ulcers Sepsis physiology -Continue cefazolin as initiated in MICU -Leukocytosis however is not improving and worsening  Can consider broadening coverage given multiple stage III and IV ulcers if persistent leukocytosis but ID Following -Repeat Blood Cx from 08/21/23 showing NGTD -WBC trend:  Recent Labs  Lab 08/19/23 1833 08/20/23 0857 08/20/23 1332 08/21/23 0607 08/23/23 0410  WBC 13.9* 19.1* 20.1* 19.4* 27.6*  -Pressures are trending somewhat soft, will start LR bolus and follow-up with maintenance at 100/hour -CVC is removed and TEE scheduled for Monday   Electrolyte Abnormalities Hyponatremia Hypokalemia Hypomagnesemia Hypophosphatemia Recent Labs  Lab 08/19/23 1833 08/20/23 0909 08/20/23 1332 08/20/23 2101 08/21/23 1030 08/21/23 2129 08/22/23 0610 08/23/23 0410  NA 135 134* 137  --  142  --  134* 132*  K 3.9 2.4* 2.5*   < > 3.3*  --  3.1* 3.4*   MG 2.0  --   --   --   --    < > 1.4* 2.3  PHOS  --   --   --   --   --    < > 1.8* 2.7   < > = values in this interval not displayed.  -Replete K+ -Continue to Monitor and Replete as Necessary -Repeat CMP in the AM    Metabolic encephalopathy Aphasia -Metabolic encephalopathy/aphasia appears to be resolved -Patient is alert and oriented to my exam, answers questions appropriately and logically -Per MICU note, there was a question of breakthrough seizure and neurology would like an MRI prior to discharge primarily due to issues of access to care -Currently not stable to discharge  Metabolic Acidosis -Has a CO2 of 21, AG of 10, and Chloride Level of 101 -Continue to Monitor and Trend and repeat CMP in the AM   Profound anemia, hemoglobin 4 on admission H/O erosive esophagitis Hematemesis -Patient has responded well to transfusion and H&H have been stable -Hgb/Hct Trend: Recent Labs  Lab 08/19/23 1833 08/20/23 0857 08/20/23 1332 08/21/23 0607 08/23/23 0410  HGB 4.5* 8.5* 9.1* 8.8* 8.1*  HCT 19.9* 28.3* 30.1* 28.9* 27.3*  MCV 62.4* 71.6* 70.2* 71.5* 73.8*  -Has been following and are planning repeat EGD tomorrow although they think erosive esophagitis is likely cause of hematemesis. -Continue pantoprazole -No source of upper GI bleeding found on EGD per GI   HTN Continue present management   Protein calorie malnutrition POA Prescient RD involvement Nutrition Status: Nutrition Problem: Severe Malnutrition Etiology: chronic illness Signs/Symptoms:  severe fat depletion, severe muscle depletion Interventions: Refer to RD note for recommendations -KUB done and showed "Tip of the weighted enteric tube is below the diaphragm in the stomach, more detailed localization is limited due to positioning."  Hyperbilirubinemia -Bilirubin Trend: Recent Labs  Lab 08/19/23 1833 08/20/23 0909 08/22/23 0610  BILITOT 0.5 1.3* 0.6  -Continue to Monitor and Trend and repeat CMP in the  AM  Hypoalbuminemia -Patient's Albumin Trend: Recent Labs  Lab 08/19/23 1833 08/20/23 0909 08/22/23 0610  ALBUMIN 1.8* <1.5* 2.0*  -Continue to Monitor and Trend and repeat CMP in the AM   Pressure Ulcers Pressure Injury 09/13/16 Buttocks Left Unstageable - Full thickness tissue loss in which the base of the injury is covered by slough (yellow, tan, gray, green or brown) and/or eschar (tan, brown or black) in the wound bed. (Active)  09/13/16 0126  Location: Buttocks  Location Orientation: Left  Staging: Unstageable - Full thickness tissue loss in which the base of the injury is covered by slough (yellow, tan, gray, green or brown) and/or eschar (tan, brown or black) in the wound bed.  Wound Description (Comments):   Present on Admission: Yes     Pressure Injury 09/13/16 Ischial tuberosity Left Unstageable - Full thickness tissue loss in which the base of the injury is covered by slough (yellow, tan, gray, green or brown) and/or eschar (tan, brown or black) in the wound bed. (Active)  09/13/16 0127  Location: Ischial tuberosity  Location Orientation: Left  Staging: Unstageable - Full thickness tissue loss in which the base of the injury is covered by slough (yellow, tan, gray, green or brown) and/or eschar (tan, brown or black) in the wound bed.  Wound Description (Comments):   Present on Admission: Yes     Pressure Injury 08/19/23 Leg Left;Anterior Stage 4 - Full thickness tissue loss with exposed bone, tendon or muscle. (Active)  08/19/23 2028  Location: Leg  Location Orientation: Left;Anterior  Staging: Stage 4 - Full thickness tissue loss with exposed bone, tendon or muscle.  Wound Description (Comments):   Present on Admission: Yes     Pressure Injury 08/19/23 Foot Right;Distal Stage 3 -  Full thickness tissue loss. Subcutaneous fat may be visible but bone, tendon or muscle are NOT exposed. full thickness (Active)  08/19/23 2032  Location: Foot  Location Orientation:  Right;Distal  Staging: Stage 3 -  Full thickness tissue loss. Subcutaneous fat may be visible but bone, tendon or muscle are NOT exposed.  Wound Description (Comments): full thickness  Present on Admission: Yes     Pressure Injury 08/19/23 Thigh Anterior;Proximal;Right Right thigh Unstageable (Active)  08/19/23 2034  Location: Thigh  Location Orientation: Anterior;Proximal;Right  Staging:   Wound Description (Comments): Right thigh Unstageable  Present on Admission: Yes     Pressure Injury 08/19/23 Elbow Right Unstageable - Full thickness tissue loss in which the base of the injury is covered by slough (yellow, tan, gray, green or brown) and/or eschar (tan, brown or black) in the wound bed. (Active)  08/19/23 2035  Location: Elbow  Location Orientation: Right  Staging: Unstageable - Full thickness tissue loss in which the base of the injury is covered by slough (yellow, tan, gray, green or brown) and/or eschar (tan, brown or black) in the wound bed.  Wound Description (Comments):   Present on Admission: Yes     Pressure Injury 08/19/23 Ischial tuberosity Right Unstageable - Full thickness tissue loss in which the base of the injury is covered by slough (yellow,  tan, gray, green or brown) and/or eschar (tan, brown or black) in the wound bed. (Active)  08/19/23 2036  Location: Ischial tuberosity  Location Orientation: Right  Staging: Unstageable - Full thickness tissue loss in which the base of the injury is covered by slough (yellow, tan, gray, green or brown) and/or eschar (tan, brown or black) in the wound bed.  Wound Description (Comments):   Present on Admission: Yes     Pressure Injury 08/19/23 Arm Anterior;Right;Upper;Medial Stage 3 -  Full thickness tissue loss. Subcutaneous fat may be visible but bone, tendon or muscle are NOT exposed. (Active)  08/19/23 2037  Location: Arm  Location Orientation: Anterior;Right;Upper;Medial  Staging: Stage 3 -  Full thickness tissue loss.  Subcutaneous fat may be visible but bone, tendon or muscle are NOT exposed.  Wound Description (Comments):   Present on Admission: Yes     Pressure Injury 08/19/23 Lumbar Lateral;Right;Posterior Stage 2 -  Partial thickness loss of dermis presenting as a shallow open injury with a red, pink wound bed without slough. (Active)  08/19/23 2038  Location: Lumbar  Location Orientation: Lateral;Right;Posterior  Staging: Stage 2 -  Partial thickness loss of dermis presenting as a shallow open injury with a red, pink wound bed without slough.  Wound Description (Comments):   Present on Admission: Yes     Pressure Injury 08/19/23 Heel Left;Posterior Stage 3 -  Full thickness tissue loss. Subcutaneous fat may be visible but bone, tendon or muscle are NOT exposed. (Active)  08/19/23 2039  Location: Heel  Location Orientation: Left;Posterior  Staging: Stage 3 -  Full thickness tissue loss. Subcutaneous fat may be visible but bone, tendon or muscle are NOT exposed.  Wound Description (Comments):   Present on Admission: Yes    DVT prophylaxis: Place and maintain sequential compression device Start: 08/20/23 1158 SCDs Start: 08/20/23 1122    Code Status: Full Code Family Communication: No family present at bedside  Disposition Plan:  Level of care: Progressive Status is: Inpatient Remains inpatient appropriate because: Needs further clinical improvement   Consultants:  [facility]  Procedures:  EGD Findings:      The larynx was normal.      An 8-10 cm hiatal hernia was present.      The stomach was normal. Specifically, no erosions or ulcerations were       seen within the hiatal hernia sac and or elsewhere in the stomach.      The cardia and gastric fundus were normal on retroflexion.      The examined duodenum was normal. Impression:               - Normal larynx.                           - 8-10 cm hiatal hernia.                           - Normal stomach.                            - Normal examined duodenum.                           - No specimens collected.  No source of upper GI blood loss seen.                           Note that after the procedure, further physical                            exam was done and discovered this patient had a                            large amount of muddy brown stool, though if there                            is no overt GI bleeding at present. He is also                            noted to have multiple sacral and buttock ulcers,                            bandages for which are partially coming off. The                            ulcers and surrounding tissue are raw, suggesting                            there is a source of possible chronic blood loss                            here as well. Recommendation:           - Return patient to hospital ward for ongoing care.                           - Resume tube feeds after a new core track tube is                            placed.                           - Continue present medications.                           - Sister updated, and we will have ongoing                            discussions with family, but consideration should                            be given to colonoscopy for further evaluation of                            this patient's multifactorial anemia that includes                            iron deficiency. Flexible  sigmoidoscopy with poor                            preparation during a workup for anemia in 2015 at                            an outside GI practice.                           While that procedure & the preparation will be                            technically challenging due to the patient's body                            habitus,contracture and medical condition,his colon                            has never been fully evaluated.                           Perhaps not as an inpatient, but eventual                             consideration should be given to surgical placement                            of a gastrostomy tube. While understandably                            technically challenging given his body habitus, it                            would not only reduce his hiatal hernia to decrease                            the chance of aspiration in this high risk patient,                            it would provide a consistent source of adequate                            enteral nutrition for his severe protein calorie                            malnutrition.                           Please see above, this patient's anemia is in large                            part related to malnutrition, chronic  decubitus/buttock ulcers.  Antimicrobials:  Anti-infectives (From admission, onward)    Start     Dose/Rate Route Frequency Ordered Stop   08/21/23 1000  ceFAZolin (ANCEF) IVPB 2g/100 mL premix        2 g 200 mL/hr over 30 Minutes Intravenous Every 8 hours 08/21/23 0911     08/20/23 1400  piperacillin-tazobactam (ZOSYN) IVPB 3.375 g  Status:  Discontinued        3.375 g 12.5 mL/hr over 240 Minutes Intravenous Every 8 hours 08/20/23 1131 08/21/23 0911   08/20/23 0900  vancomycin (VANCOREADY) IVPB 500 mg/100 mL  Status:  Discontinued        500 mg 100 mL/hr over 60 Minutes Intravenous Every 12 hours 08/19/23 1943 08/21/23 0911   08/20/23 0400  ceFEPIme (MAXIPIME) 2 g in sodium chloride 0.9 % 100 mL IVPB  Status:  Discontinued        2 g 200 mL/hr over 30 Minutes Intravenous Every 8 hours 08/19/23 1943 08/20/23 1124   08/19/23 1930  ceFEPIme (MAXIPIME) 2 g in sodium chloride 0.9 % 100 mL IVPB        2 g 200 mL/hr over 30 Minutes Intravenous  Once 08/19/23 1923 08/19/23 2030   08/19/23 1930  metroNIDAZOLE (FLAGYL) IVPB 500 mg        500 mg 100 mL/hr over 60 Minutes Intravenous  Once 08/19/23 1923 08/19/23 2143   08/19/23 1930  vancomycin (VANCOCIN) IVPB 1000  mg/200 mL premix        1,000 mg 200 mL/hr over 60 Minutes Intravenous  Once 08/19/23 1923 08/19/23 2322       Subjective: Seen and examined at bedside and he appears uncomfortable.  He does have a bowel movement.  No nausea or vomiting.  He came back from his EGD and was complaining of some discomfort.  No other concerns or plaints at this time.  Objective: Vitals:   08/23/23 1040 08/23/23 1050 08/23/23 1100 08/23/23 1710  BP: (!) 92/53 (!) 97/57 101/62 112/73  Pulse: 91 89 92 100  Resp: (!) 26 (!) 23 20 (!) 21  Temp: 97.9 F (36.6 C)   98.1 F (36.7 C)  TempSrc: Temporal   Oral  SpO2: 98% 99% 99%   Weight:      Height:        Intake/Output Summary (Last 24 hours) at 08/23/2023 1927 Last data filed at 08/23/2023 1832 Gross per 24 hour  Intake 946.19 ml  Output 2101 ml  Net -1154.81 ml   Filed Weights   08/22/23 0539 08/23/23 0419 08/23/23 0932  Weight: 47.7 kg 45.7 kg 45.7 kg   Examination: Physical Exam:  Constitutional: Thin chronically ill-appearing African-American male with cerebral palsy quadriplegia with multiple pressure wounds and contractures Respiratory: Diminished to auscultation bilaterally, no wheezing, rales, rhonchi or crackles. Normal respiratory effort and patient is not tachypenic. No accessory muscle use.  Cardiovascular: RRR, no murmurs / rubs / gallops. S1 and S2 auscultated.  Abdomen: Soft, non-tender, non-distended. Bowel sounds positive.  GU: Deferred. Musculoskeletal: Multiple contractures noted and atrophy Skin: Has a very large buttock ulcer Neurologic: Renal nerves II through XII grossly intact but he is quadriplegic and has cerebral palsy  Psychiatric: Appears anxious  Data Reviewed: I have personally reviewed following labs and imaging studies  CBC: Recent Labs  Lab 08/19/23 1833 08/20/23 0857 08/20/23 1332 08/21/23 0607 08/23/23 0410  WBC 13.9* 19.1* 20.1* 19.4* 27.6*  NEUTROABS 10.2* 16.1* 16.6*  --  26.2*  HGB 4.5* 8.5* 9.1*  8.8* 8.1*  HCT 19.9* 28.3* 30.1* 28.9* 27.3*  MCV 62.4* 71.6* 70.2* 71.5* 73.8*  PLT 526* 434* 450* 390 311   Basic Metabolic Panel: Recent Labs  Lab 08/19/23 1833 08/20/23 0909 08/20/23 1332 08/20/23 2101 08/21/23 1030 08/21/23 2129 08/22/23 0610 08/23/23 0410  NA 135 134* 137  --  142  --  134* 132*  K 3.9 2.4* 2.5* 2.9* 3.3*  --  3.1* 3.4*  CL 104 104 105  --  105  --  103 101  CO2 24 24 24   --  25  --  21* 21*  GLUCOSE 92 109* 92  --  85  --  139* 63*  BUN 16 12 9   --  <5*  --  5* <5*  CREATININE 0.55* 0.39* 0.46*  --  0.67  --  0.45* 0.35*  CALCIUM 7.8* 7.1* 7.7*  --  8.2*  --  7.5* 7.3*  MG 2.0  --   --   --   --  1.4* 1.4* 2.3  PHOS  --   --   --   --   --  2.1* 1.8* 2.7   GFR: Estimated Creatinine Clearance: 65.9 mL/min (A) (by C-G formula based on SCr of 0.35 mg/dL (L)). Liver Function Tests: Recent Labs  Lab 08/19/23 1833 08/20/23 0909 08/22/23 0610  AST 40 23 17  ALT 30 21 13   ALKPHOS 95 75 68  BILITOT 0.5 1.3* 0.6  PROT 8.5* 6.2* 6.0*  ALBUMIN 1.8* <1.5* 2.0*   No results for input(s): "LIPASE", "AMYLASE" in the last 168 hours. Recent Labs  Lab 08/19/23 1946 08/20/23 1230  AMMONIA 27 21   Coagulation Profile: Recent Labs  Lab 08/19/23 1833 08/20/23 0857 08/21/23 0330  INR 1.5* 1.6* 1.7*   Cardiac Enzymes: No results for input(s): "CKTOTAL", "CKMB", "CKMBINDEX", "TROPONINI" in the last 168 hours. BNP (last 3 results) No results for input(s): "PROBNP" in the last 8760 hours. HbA1C: No results for input(s): "HGBA1C" in the last 72 hours. CBG: Recent Labs  Lab 08/23/23 0955 08/23/23 1151 08/23/23 1319 08/23/23 1356 08/23/23 1714  GLUCAP 71 67* 108* 109* 84   Lipid Profile: No results for input(s): "CHOL", "HDL", "LDLCALC", "TRIG", "CHOLHDL", "LDLDIRECT" in the last 72 hours. Thyroid Function Tests: No results for input(s): "TSH", "T4TOTAL", "FREET4", "T3FREE", "THYROIDAB" in the last 72 hours. Anemia Panel: Recent Labs     08/21/23 0330  TIBC 130*  IRON 10*   Sepsis Labs: Recent Labs  Lab 08/19/23 1946 08/19/23 2150 08/20/23 0857 08/20/23 1332  PROCALCITON  --   --   --  0.18  LATICACIDVEN 1.8 5.4* 0.8  --     Recent Results (from the past 240 hour(s))  Resp panel by RT-PCR (RSV, Flu A&B, Covid) Anterior Nasal Swab     Status: None   Collection Time: 08/19/23  7:37 PM   Specimen: Anterior Nasal Swab  Result Value Ref Range Status   SARS Coronavirus 2 by RT PCR NEGATIVE NEGATIVE Final    Comment: (NOTE) SARS-CoV-2 target nucleic acids are NOT DETECTED.  The SARS-CoV-2 RNA is generally detectable in upper respiratory specimens during the acute phase of infection. The lowest concentration of SARS-CoV-2 viral copies this assay can detect is 138 copies/mL. A negative result does not preclude SARS-Cov-2 infection and should not be used as the sole basis for treatment or other patient management decisions. A negative result may occur with  improper specimen collection/handling, submission of specimen other than nasopharyngeal swab, presence of viral  mutation(s) within the areas targeted by this assay, and inadequate number of viral copies(<138 copies/mL). A negative result must be combined with clinical observations, patient history, and epidemiological information. The expected result is Negative.  Fact Sheet for Patients:  BloggerCourse.com  Fact Sheet for Healthcare Providers:  SeriousBroker.it  This test is no t yet approved or cleared by the [place] FDA and  has been authorized for detection and/or diagnosis of SARS-CoV-2 by FDA under an Emergency Use Authorization (EUA). This EUA will remain  in effect (meaning this test can be used) for the duration of the COVID-19 declaration under Section 564(b)(1) of the Act, 21 U.S.C.section 360bbb-3(b)(1), unless the authorization is terminated  or revoked sooner.       Influenza A by PCR  NEGATIVE NEGATIVE Final   Influenza B by PCR NEGATIVE NEGATIVE Final    Comment: (NOTE) The Xpert Xpress SARS-CoV-2/FLU/RSV plus assay is intended as an aid in the diagnosis of influenza from Nasopharyngeal swab specimens and should not be used as a sole basis for treatment. Nasal washings and aspirates are unacceptable for Xpert Xpress SARS-CoV-2/FLU/RSV testing.  Fact Sheet for Patients: BloggerCourse.com  Fact Sheet for Healthcare Providers: SeriousBroker.it  This test is not yet approved or cleared by the [place] FDA and has been authorized for detection and/or diagnosis of SARS-CoV-2 by FDA under an Emergency Use Authorization (EUA). This EUA will remain in effect (meaning this test can be used) for the duration of the COVID-19 declaration under Section 564(b)(1) of the Act, 21 U.S.C. section 360bbb-3(b)(1), unless the authorization is terminated or revoked.     Resp Syncytial Virus by PCR NEGATIVE NEGATIVE Final    Comment: (NOTE) Fact Sheet for Patients: BloggerCourse.com  Fact Sheet for Healthcare Providers: SeriousBroker.it  This test is not yet approved or cleared by the [place] FDA and has been authorized for detection and/or diagnosis of SARS-CoV-2 by FDA under an Emergency Use Authorization (EUA). This EUA will remain in effect (meaning this test can be used) for the duration of the COVID-19 declaration under Section 564(b)(1) of the Act, 21 U.S.C. section 360bbb-3(b)(1), unless the authorization is terminated or revoked.  Performed at [facility], [address] [place], [place] [address]   Blood Culture (routine x 2)     Status: Abnormal   Collection Time: 08/19/23  7:46 PM   Specimen: BLOOD  Result Value Ref Range Status   Specimen Description   Final    BLOOD BLOOD RIGHT HAND Performed at [facility], [address] [place], [place]  [address]    Special Requests   Final    BOTTLES DRAWN AEROBIC AND ANAEROBIC Blood Culture adequate volume Performed at [facility], [address], [place], [place] [address]    Culture  Setup Time   Final    AEROBIC BOTTLE ONLY GRAM POSITIVE COCCI Gram Stain Report Called to,Read Back By and Verified With: WHITE, DANIEL ON 08/20/2023 AT 1320 BY FRATTO,ASHLEY CRITICAL RESULT CALLED TO, READ BACK BY AND VERIFIED WITH: PHARMD TWEE  DANG ON 08/20/23 @ 1715 BY DRT    Culture (A)  Final    STAPHYLOCOCCUS AUREUS SUSCEPTIBILITIES PERFORMED ON PREVIOUS CULTURE WITHIN THE LAST 5 DAYS. Performed at [facility], [address], [place], [place] [address]    Report Status 08/22/2023 FINAL  Final  Blood Culture (routine x 2)     Status: Abnormal   Collection Time: 08/19/23  8:05 PM   Specimen: BLOOD RIGHT HAND  Result Value Ref  Range Status   Specimen Description   Final    BLOOD RIGHT HAND Performed at [facility], [address], [place], [place] [address]    Special Requests   Final    BOTTLES DRAWN AEROBIC ONLY Blood Culture results may not be optimal due to an excessive volume of blood received in culture bottles Performed at [facility], [address], [place], [place] [address]    Culture  Setup Time   Final    AEROBIC BOTTLE ONLY GRAM POSITIVE COCCI Gram Stain Report Called to,Read Back By and Verified With: WHITE, DANIEL ON 08/20/2023 AT 1320 BY FRATTO,ASHLEY CRITICAL RESULT CALLED TO, READ BACK BY AND VERIFIED WITH: PHARMD TWEE  DANG ON 08/20/23 @ 1715 BY DRT Performed at [facility], [address] [place], [place] [address]    Culture STAPHYLOCOCCUS AUREUS (A)  Final   Report Status 08/22/2023 FINAL  Final   Organism ID, Bacteria STAPHYLOCOCCUS AUREUS  Final      Susceptibility   Staphylococcus aureus - MIC*    CIPROFLOXACIN <=0.5 SENSITIVE Sensitive     ERYTHROMYCIN <=0.25 SENSITIVE Sensitive     GENTAMICIN <=0.5 SENSITIVE Sensitive     OXACILLIN <=0.25  SENSITIVE Sensitive     TETRACYCLINE <=1 SENSITIVE Sensitive     VANCOMYCIN <=0.5 SENSITIVE Sensitive     TRIMETH/SULFA <=10 SENSITIVE Sensitive     CLINDAMYCIN <=0.25 SENSITIVE Sensitive     RIFAMPIN <=0.5 SENSITIVE Sensitive     Inducible Clindamycin NEGATIVE Sensitive     LINEZOLID 2 SENSITIVE Sensitive     * STAPHYLOCOCCUS AUREUS  Blood Culture ID Panel (Reflexed)     Status: Abnormal   Collection Time: 08/19/23  8:05 PM  Result Value Ref Range Status   Enterococcus faecalis NOT DETECTED NOT DETECTED Final   Enterococcus Faecium NOT DETECTED NOT DETECTED Final   Listeria monocytogenes NOT DETECTED NOT DETECTED Final   Staphylococcus species DETECTED (A) NOT DETECTED Final    Comment: CRITICAL RESULT CALLED TO, READ BACK BY AND VERIFIED WITH: PHARMD TWEE  DANG ON 08/20/23 @ 1715 BY DRT    Staphylococcus aureus (BCID) DETECTED (A) NOT DETECTED Final    Comment: CRITICAL RESULT CALLED TO, READ BACK BY AND VERIFIED WITH: PHARMD TWEE  DANG ON 08/20/23 @ 1715 BY DRT    Staphylococcus epidermidis NOT DETECTED NOT DETECTED Final   Staphylococcus lugdunensis NOT DETECTED NOT DETECTED Final   Streptococcus species NOT DETECTED NOT DETECTED Final   Streptococcus agalactiae NOT DETECTED NOT DETECTED Final   Streptococcus pneumoniae NOT DETECTED NOT DETECTED Final   Streptococcus pyogenes NOT DETECTED NOT DETECTED Final   A.calcoaceticus-baumannii NOT DETECTED NOT DETECTED Final   Bacteroides fragilis NOT DETECTED NOT DETECTED Final   Enterobacterales NOT DETECTED NOT DETECTED Final   Enterobacter cloacae complex NOT DETECTED NOT DETECTED Final   Escherichia coli NOT DETECTED NOT DETECTED Final   Klebsiella aerogenes NOT DETECTED NOT DETECTED Final   Klebsiella oxytoca NOT DETECTED NOT DETECTED Final   Klebsiella pneumoniae NOT DETECTED NOT DETECTED Final   Proteus species NOT DETECTED NOT DETECTED Final   Salmonella species NOT DETECTED NOT DETECTED Final   Serratia marcescens NOT  DETECTED NOT DETECTED Final   Haemophilus influenzae NOT DETECTED NOT DETECTED Final   Neisseria meningitidis NOT DETECTED NOT DETECTED Final   Pseudomonas aeruginosa NOT DETECTED NOT DETECTED Final   Stenotrophomonas maltophilia NOT DETECTED NOT DETECTED Final   Candida albicans NOT DETECTED NOT DETECTED Final   Candida auris NOT  DETECTED NOT DETECTED Final   Candida glabrata NOT DETECTED NOT DETECTED Final   Candida krusei NOT DETECTED NOT DETECTED Final   Candida parapsilosis NOT DETECTED NOT DETECTED Final   Candida tropicalis NOT DETECTED NOT DETECTED Final   Cryptococcus neoformans/gattii NOT DETECTED NOT DETECTED Final   Meth resistant mecA/C and MREJ NOT DETECTED NOT DETECTED Final    Comment: Performed at [facility], [address], [place], [place] [address]  MRSA Next Gen by PCR, Nasal     Status: None   Collection Time: 08/20/23 11:30 AM   Specimen: Nasal Mucosa; Nasal Swab  Result Value Ref Range Status   MRSA by PCR Next Gen NOT DETECTED NOT DETECTED Final    Comment: (NOTE) The GeneXpert MRSA Assay (FDA approved for NASAL specimens only), is one component of a comprehensive MRSA colonization surveillance program. It is not intended to diagnose MRSA infection nor to guide or monitor treatment for MRSA infections. Test performance is not FDA approved in patients less than 20 years old. Performed at [facility], [address], [place], [place] [address]   Culture, blood (Routine X 2) w Reflex to ID Panel     Status: None (Preliminary result)   Collection Time: 08/21/23  6:44 PM   Specimen: BLOOD RIGHT HAND  Result Value Ref Range Status   Specimen Description BLOOD RIGHT HAND  Final   Special Requests   Final    BOTTLES DRAWN AEROBIC AND ANAEROBIC Blood Culture adequate volume   Culture   Final    NO GROWTH 2 DAYS Performed at [facility], [address], [place], [place] [address]    Report Status PENDING  Incomplete  Culture, blood (Routine  X 2) w Reflex to ID Panel     Status: None (Preliminary result)   Collection Time: 08/21/23  6:44 PM   Specimen: BLOOD RIGHT ARM  Result Value Ref Range Status   Specimen Description BLOOD RIGHT ARM  Final   Special Requests   Final    BOTTLES DRAWN AEROBIC AND ANAEROBIC Blood Culture adequate volume   Culture   Final    NO GROWTH 2 DAYS Performed at [facility], [address] [place], [place] [address]    Report Status PENDING  Incomplete    Radiology Studies: DG Abd Portable 1V  Result Date: 08/23/2023 CLINICAL DATA:  Encounter for feeding tube placement. EXAM: PORTABLE ABDOMEN - 1 VIEW COMPARISON:  Radiograph 08/21/2023 FINDINGS: Significant patient rotation limits assessment. Tip of the weighted enteric tube is below the diaphragm in the stomach, more detailed localization is limited due to positioning. IMPRESSION: Tip of the weighted enteric tube is below the diaphragm in the stomach, more detailed localization is limited due to positioning. Electronically Signed   By: Narda Rutherford M.D.   On: 08/23/2023 15:14   MR BRAIN WO CONTRAST  Result Date: 08/22/2023 CLINICAL DATA:  Mental status change, unknown cause EXAM: MRI HEAD WITHOUT CONTRAST TECHNIQUE: Multiplanar, multiecho pulse sequences of the brain and surrounding structures were obtained without intravenous contrast. COMPARISON:  CT head 12/24/2009. FINDINGS: Motion limited and incomplete study.  Within this limitation: Brain: No acute infarction, hemorrhage, hydrocephalus, extra-axial collection or mass lesion. Bifrontal remote infarcts. Cerebral atrophy. Vascular: Major arterial flow voids are maintained. Skull and upper cervical spine: Normal marrow signal. Sinuses/Orbits: Mostly clear sinuses. No acute orbital findings on motion limited assessment. IMPRESSION: 1. Motion limited and incomplete study without evidence of acute intracranial abnormality. 2.  Remote bifrontal infarcts. 3.  Cerebral atrophy (ICD10-G31.9).  Electronically Signed   By: Feliberto Harts M.D.   On: 08/22/2023 15:39   CT MAXILLOFACIAL WO CONTRAST  Result Date: 08/22/2023 CLINICAL DATA:  poor dentition EXAM: CT MAXILLOFACIAL WITHOUT CONTRAST TECHNIQUE: Multidetector CT imaging of the maxillofacial structures was performed. Multiplanar CT image reconstructions were also generated. RADIATION DOSE REDUCTION: This exam was performed according to the departmental dose-optimization program which includes automated exposure control, adjustment of the mA and/or kV according to patient size and/or use of iterative reconstruction technique. COMPARISON:  None Available. FINDINGS: Osseous: No fracture or mandibular dislocation. No destructive process. Orbits: Negative. No traumatic or inflammatory finding. Sinuses: Right maxillary sinus retention cyst. Otherwise, visualized sinuses are clear. Soft tissues: Negative. Limited intracranial: No significant or unexpected finding. Other: Poor dentate shin with multiple periapical lucencies and dental caries. No visible fluid collection. IMPRESSION: 1. No evidence of acute abnormality. 2. Poor dentition without visible fluid collection or inflammatory change. Electronically Signed   By: Feliberto Harts M.D.   On: 08/22/2023 15:36   DG Hand 2 View Right  Result Date: 08/22/2023 CLINICAL DATA:  Metal implant. Clearance for MRI.Patient's arm, wrist, and hand contracted at time of imaging. EXAM: RIGHT HAND - 2 VIEW COMPARISON:  None available FINDINGS: Frontal and lateral views of the left hand. There is high-grade flexion of all the metacarpophalangeal joints. There is a metallic pin there appears to be traversing the distal radius through the distal aspect of the fourth metatarsal shaft. There is complete osseous fusion of the radiocarpal joints. IMPRESSION: 1. Metallic pin traversing the distal radius through the distal aspect of the fourth metatarsal shaft. 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 This  implant is likely stable and MRI compatible. 2. Complete osseous fusion of the radiocarpal joints. Electronically Signed   By: Neita Garnet M.D.   On: 08/22/2023 14:53   EEG adult  Result Date: 08/22/2023 Charlsie Quest, MD     08/22/2023  8:38 AM Patient Name: SONAM YAZZIE MRN: [id number] Epilepsy Attending: Charlsie Quest Referring Physician/Provider: Talitha Givens, NP Date: 08/21/2023 Duration: 23.26 mins Patient history: 57yo M with h.o seizures now with ams getting eeg to evaluate for seizure. Level of alertness: Awake AEDs during EEG study: None Technical aspects: This EEG study was done with scalp electrodes positioned according to the 10-20 International system of electrode placement. Electrical activity was reviewed with band pass filter of 1-70Hz , sensitivity of 7 uV/mm, display speed of 86mm/sec with a 60Hz  notched filter applied as appropriate. EEG data were recorded continuously and digitally stored.  Video monitoring was available and reviewed as appropriate. Description: EEG showed continuous generalized 3 to 6 Hz theta-delta slowing.  Hyperventilation and photic stimulation were not performed.   Of note, study was technically difficult due to significant myogenic artifact. ABNORMALITY - Continuous slow, generalized IMPRESSION: This technically difficult study is suggestive of moderate diffuse encephalopathy. No seizures or epileptiform discharges were seen throughout the recording. Priyanka Annabelle Harman    Scheduled Meds:  Chlorhexidine Gluconate Cloth  6 each Topical Daily   feeding supplement  237 mL Oral TID BM   insulin aspart  0-6 Units Subcutaneous Q4H   leptospermum manuka honey  1 Application Topical Daily   multivitamin with minerals  1 tablet Oral Daily   mupirocin ointment   Nasal BID   nutrition supplement (JUVEN)  1 packet Per Tube BID BM   mouth rinse  15 mL Mouth Rinse 4 times  per day   pantoprazole  40 mg Intravenous Q12H   Followed by   Melene Muller ON 08/24/2023]  pantoprazole  40 mg Oral Daily   thiamine  100 mg Oral Daily   Continuous Infusions:   ceFAZolin (ANCEF) IV 2 g (08/23/23 1832)   feeding supplement (OSMOLITE 1.2 CAL) 1,000 mL (08/23/23 1754)   lactated ringers Stopped (08/23/23 1624)    LOS: 3 days   Marguerita Merles, DO [facility] Available via Epic secure chat 7am-7pm After these hours, please refer to coverage provider listed on [email or web address] 08/23/2023, 7:27 PM

## 2023-08-23 NOTE — Progress Notes (Signed)
   08/22/23 1926  Assess: MEWS Score  Temp (!) 97.2 F (36.2 C)  BP (!) 86/42  MAP (mmHg) 66  Pulse Rate (!) 40  Resp 14  SpO2 98 %  Assess: MEWS Score  MEWS Temp 0  MEWS Systolic 1  MEWS Pulse 1  MEWS RR 0  MEWS LOC 0  MEWS Score 2  MEWS Score Color Yellow  Assess: if the MEWS score is Yellow or Red  Were vital signs accurate and taken at a resting state? Yes  Assess: SIRS CRITERIA  SIRS Temperature  0  SIRS Pulse 0  SIRS Respirations  0  SIRS WBC 0  SIRS Score Sum  0

## 2023-08-23 NOTE — Anesthesia Preprocedure Evaluation (Addendum)
Anesthesia Evaluation  Patient identified by MRN, date of birth, ID band Patient awake    Reviewed: Allergy & Precautions, NPO status , Patient's Chart, lab work & pertinent test results  History of Anesthesia Complications Negative for: history of anesthetic complications  Airway Mallampati: Unable to assess       Dental  (+) Dental Advisory Given, Poor Dentition   Pulmonary former smoker   Pulmonary exam normal breath sounds clear to auscultation       Cardiovascular  Rhythm:Regular Rate:Normal  HLD  TTE 08/21/2023: IMPRESSIONS    1. Left ventricular ejection fraction, by estimation, is 55 to 60%. The  left ventricle has normal function. The left ventricle has no regional  wall motion abnormalities. Left ventricular diastolic parameters were  normal.   2. Right ventricular systolic function is normal. The right ventricular  size is normal. Tricuspid regurgitation signal is inadequate for assessing  PA pressure.   3. The mitral valve is grossly normal. Trivial mitral valve  regurgitation. No evidence of mitral stenosis.   4. Focal calium noted on the LCC. The aortic valve is tricuspid. Aortic  valve regurgitation is not visualized. No aortic stenosis is present.     Neuro/Psych Seizures -,  PSYCHIATRIC DISORDERS Anxiety Depression     Neuromuscular disease (cerbral palsy with quadriplegia)    GI/Hepatic PUD,,,  Endo/Other    Renal/GU      Musculoskeletal   Abdominal   Peds  Hematology  (+) Blood dyscrasia, anemia Lab Results      Component                Value               Date                      WBC                      27.6 (H)            08/23/2023                HGB                      8.1 (L)             08/23/2023                HCT                      27.3 (L)            08/23/2023                MCV                      73.8 (L)            08/23/2023                PLT                      311                  08/23/2023              Anesthesia Other Findings 57 year old man w/ hx of cerebral palsy functional quadriplegic, prior erosive gastritis, chronic pressure injuries presenting with altered mental status and  aphasia.  Workup revealed hemoglobin 4, MSSA bacteremia.  Stroke was ruled out by imaging and neurology felt "aphasia" was secondary to metabolic encephalopathy.  Patient admitted to ICU on pressor with improvement.  Now transferred out to floor.  He has admitted to having had hematemesis lately, but has not had any in the past few days. Tube feeds were stopped at 12:30.  Reproductive/Obstetrics                             Anesthesia Physical Anesthesia Plan  ASA: 3  Anesthesia Plan: MAC   Post-op Pain Management:    Induction: Intravenous  PONV Risk Score and Plan: 1 and Propofol infusion, TIVA and Treatment may vary due to age or medical condition  Airway Management Planned: Nasal Cannula and Natural Airway  Additional Equipment:   Intra-op Plan:   Post-operative Plan:   Informed Consent: I have reviewed the patients History and Physical, chart, labs and discussed the procedure including the risks, benefits and alternatives for the proposed anesthesia with the patient or authorized representative who has indicated his/her understanding and acceptance.     Dental advisory given (Patient's sister, Hermes Ballow, was consented via phone.)  Plan Discussed with: CRNA and Anesthesiologist  Anesthesia Plan Comments: (Discussed with patient risks of MAC including, but not limited to, minor pain or discomfort, hearing people in the room, and possible need for backup general anesthesia. Risks for general anesthesia also discussed including, but not limited to, sore throat, hoarse voice, chipped/damaged teeth, injury to vocal cords, nausea and vomiting, allergic reactions, lung infection, heart attack, stroke, and death. All questions answered. )         Anesthesia Quick Evaluation

## 2023-08-23 NOTE — Progress Notes (Signed)
ID Brief Note:   Pt was at EGD during rounds.  57 year old male with cerebral palsy resulting functional quadriplegia, multiple pressure wounds, prior history of aggressive erosive gastritis, seizures, admitted with aphasia found to have hemoglobin 4.  ID engaged for MSSA bacteremia   #MSSA bacteremia suspected secondary to pressure wounds #Leukocytosis secondary to #1 -CVC out -Continue cefazolin, Repeat blood Cx from 8/21 NG -CT maxillary facial showed poor dentition w/o fluid collection/inflammatory change -TEE Monday  #History of seizures #Aphasia - Neurology engaged, MRI negative for aciute abnormality, remote infarct shown   #GI hemorrhage and history of esophagitis -EGD today   Dr. Daiva Eves will be on service this weekend. New ID team next week.

## 2023-08-23 NOTE — Care Management Important Message (Signed)
Important Message  Patient Details  Name: Ian Hurley MRN: [id number] Date of Birth: 03-02-1966   Medicare Important Message Given:  Yes     Renie Ora 08/23/2023, 8:45 AM

## 2023-08-23 NOTE — Progress Notes (Signed)
IVT consulted for PIV access; upon arrival to bedside pt off the floor for procedure. RN to reconsult if PIV access still needed following procedure

## 2023-08-23 NOTE — Hospital Course (Addendum)
The patient is a 57 year old man w/ hx of cerebral palsy functional quadriplegic, prior erosive gastritis, chronic pressure injuries presenting with altered mental status and aphasia.  Workup revealed hemoglobin 4, MSSA bacteremia.  Stroke was ruled out by imaging and neurology felt "aphasia" was secondary to metabolic encephalopathy.  Patient admitted to ICU on pressor with improvement.  Now transferred out to floor.  He has admitted to having had hematemesis lately and GI Consulted and did EGD and showed no erosions or ulcerations within the hiatal hernia sac or in the stom and no source of upper GI Blood loss found.  Current plan is for TTE on Monday continuing cefazolin.  Patient's central venous catheters removed at this time.  NG tube is back in place now to have the patient receive Cortrak TF in addition to dysphagia 1 diet.  Assessment and Plan:  MSSA bacteremia Stage III and IV pressure ulcers Sepsis physiology -Continue cefazolin as initiated in MICU -Leukocytosis however is not improving and worsening  Can consider broadening coverage given multiple stage III and IV ulcers if persistent leukocytosis but ID Following -Repeat Blood Cx from 08/21/23 showing NGTD -WBC trend:  Recent Labs  Lab 08/19/23 1833 08/20/23 0857 08/20/23 1332 08/21/23 0607 08/23/23 0410  WBC 13.9* 19.1* 20.1* 19.4* 27.6*  -Pressures are trending somewhat soft, will start LR bolus and follow-up with maintenance at 100/hour -CVC is removed and TEE scheduled for Monday   Electrolyte Abnormalities Hyponatremia Hypokalemia Hypomagnesemia Hypophosphatemia Recent Labs  Lab 08/19/23 1833 08/20/23 0909 08/20/23 1332 08/20/23 2101 08/21/23 1030 08/21/23 2129 08/22/23 0610 08/23/23 0410  NA 135 134* 137  --  142  --  134* 132*  K 3.9 2.4* 2.5*   < > 3.3*  --  3.1* 3.4*  MG 2.0  --   --   --   --    < > 1.4* 2.3  PHOS  --   --   --   --   --    < > 1.8* 2.7   < > = values in this interval not displayed.   -Replete K+ -Continue to Monitor and Replete as Necessary -Repeat CMP in the AM    Metabolic encephalopathy Aphasia -Metabolic encephalopathy/aphasia appears to be resolved -Patient is alert and oriented to my exam, answers questions appropriately and logically -Per MICU note, there was a question of breakthrough seizure and neurology would like an MRI prior to discharge primarily due to issues of access to care -Currently not stable to discharge  Metabolic Acidosis -Has a CO2 of 21, AG of 10, and Chloride Level of 101 -Continue to Monitor and Trend and repeat CMP in the AM   Profound anemia, hemoglobin 4 on admission H/O erosive esophagitis Hematemesis -Patient has responded well to transfusion and H&H have been stable -Hgb/Hct Trend: Recent Labs  Lab 08/19/23 1833 08/20/23 0857 08/20/23 1332 08/21/23 0607 08/23/23 0410  HGB 4.5* 8.5* 9.1* 8.8* 8.1*  HCT 19.9* 28.3* 30.1* 28.9* 27.3*  MCV 62.4* 71.6* 70.2* 71.5* 73.8*  -Has been following and are planning repeat EGD tomorrow although they think erosive esophagitis is likely cause of hematemesis. -Continue pantoprazole -No source of upper GI bleeding found on EGD per GI   HTN Continue present management   Protein calorie malnutrition POA Prescient RD involvement Nutrition Status: Nutrition Problem: Severe Malnutrition Etiology: chronic illness Signs/Symptoms: severe fat depletion, severe muscle depletion Interventions: Refer to RD note for recommendations -KUB done and showed "Tip of the weighted enteric tube is  below the diaphragm in the stomach, more detailed localization is limited due to positioning."  Hyperbilirubinemia -Bilirubin Trend: Recent Labs  Lab 08/19/23 1833 08/20/23 0909 08/22/23 0610  BILITOT 0.5 1.3* 0.6  -Continue to Monitor and Trend and repeat CMP in the AM  Hypoalbuminemia -Patient's Albumin Trend: Recent Labs  Lab 08/19/23 1833 08/20/23 0909 08/22/23 0610  ALBUMIN 1.8* <1.5* 2.0*   -Continue to Monitor and Trend and repeat CMP in the AM   Pressure Ulcers Pressure Injury 09/13/16 Buttocks Left Unstageable - Full thickness tissue loss in which the base of the injury is covered by slough (yellow, tan, gray, green or brown) and/or eschar (tan, brown or black) in the wound bed. (Active)  09/13/16 0126  Location: Buttocks  Location Orientation: Left  Staging: Unstageable - Full thickness tissue loss in which the base of the injury is covered by slough (yellow, tan, gray, green or brown) and/or eschar (tan, brown or black) in the wound bed.  Wound Description (Comments):   Present on Admission: Yes     Pressure Injury 09/13/16 Ischial tuberosity Left Unstageable - Full thickness tissue loss in which the base of the injury is covered by slough (yellow, tan, gray, green or brown) and/or eschar (tan, brown or black) in the wound bed. (Active)  09/13/16 0127  Location: Ischial tuberosity  Location Orientation: Left  Staging: Unstageable - Full thickness tissue loss in which the base of the injury is covered by slough (yellow, tan, gray, green or brown) and/or eschar (tan, brown or black) in the wound bed.  Wound Description (Comments):   Present on Admission: Yes     Pressure Injury 08/19/23 Leg Left;Anterior Stage 4 - Full thickness tissue loss with exposed bone, tendon or muscle. (Active)  08/19/23 2028  Location: Leg  Location Orientation: Left;Anterior  Staging: Stage 4 - Full thickness tissue loss with exposed bone, tendon or muscle.  Wound Description (Comments):   Present on Admission: Yes     Pressure Injury 08/19/23 Foot Right;Distal Stage 3 -  Full thickness tissue loss. Subcutaneous fat may be visible but bone, tendon or muscle are NOT exposed. full thickness (Active)  08/19/23 2032  Location: Foot  Location Orientation: Right;Distal  Staging: Stage 3 -  Full thickness tissue loss. Subcutaneous fat may be visible but bone, tendon or muscle are NOT exposed.   Wound Description (Comments): full thickness  Present on Admission: Yes     Pressure Injury 08/19/23 Thigh Anterior;Proximal;Right Right thigh Unstageable (Active)  08/19/23 2034  Location: Thigh  Location Orientation: Anterior;Proximal;Right  Staging:   Wound Description (Comments): Right thigh Unstageable  Present on Admission: Yes     Pressure Injury 08/19/23 Elbow Right Unstageable - Full thickness tissue loss in which the base of the injury is covered by slough (yellow, tan, gray, green or brown) and/or eschar (tan, brown or black) in the wound bed. (Active)  08/19/23 2035  Location: Elbow  Location Orientation: Right  Staging: Unstageable - Full thickness tissue loss in which the base of the injury is covered by slough (yellow, tan, gray, green or brown) and/or eschar (tan, brown or black) in the wound bed.  Wound Description (Comments):   Present on Admission: Yes     Pressure Injury 08/19/23 Ischial tuberosity Right Unstageable - Full thickness tissue loss in which the base of the injury is covered by slough (yellow, tan, gray, green or brown) and/or eschar (tan, brown or black) in the wound bed. (Active)  08/19/23 2036  Location: Ischial tuberosity  Location Orientation: Right  Staging: Unstageable - Full thickness tissue loss in which the base of the injury is covered by slough (yellow, tan, gray, green or brown) and/or eschar (tan, brown or black) in the wound bed.  Wound Description (Comments):   Present on Admission: Yes     Pressure Injury 08/19/23 Arm Anterior;Right;Upper;Medial Stage 3 -  Full thickness tissue loss. Subcutaneous fat may be visible but bone, tendon or muscle are NOT exposed. (Active)  08/19/23 2037  Location: Arm  Location Orientation: Anterior;Right;Upper;Medial  Staging: Stage 3 -  Full thickness tissue loss. Subcutaneous fat may be visible but bone, tendon or muscle are NOT exposed.  Wound Description (Comments):   Present on Admission: Yes      Pressure Injury 08/19/23 Lumbar Lateral;Right;Posterior Stage 2 -  Partial thickness loss of dermis presenting as a shallow open injury with a red, pink wound bed without slough. (Active)  08/19/23 2038  Location: Lumbar  Location Orientation: Lateral;Right;Posterior  Staging: Stage 2 -  Partial thickness loss of dermis presenting as a shallow open injury with a red, pink wound bed without slough.  Wound Description (Comments):   Present on Admission: Yes     Pressure Injury 08/19/23 Heel Left;Posterior Stage 3 -  Full thickness tissue loss. Subcutaneous fat may be visible but bone, tendon or muscle are NOT exposed. (Active)  08/19/23 2039  Location: Heel  Location Orientation: Left;Posterior  Staging: Stage 3 -  Full thickness tissue loss. Subcutaneous fat may be visible but bone, tendon or muscle are NOT exposed.  Wound Description (Comments):   Present on Admission: Yes

## 2023-08-23 NOTE — Progress Notes (Signed)
SLP Cancellation Note  Patient Details Name: Ian Hurley MRN: [id number] DOB: 05/09/1966   Cancelled treatment:       Reason Eval/Treat Not Completed: Patient at procedure or test/unavailable. Pt NPO for EGD and recovery. Current diet recommendations of Dys 1 textures with thin liquids remain appropriate with safe swallowing strategies and aspiration precautions once diet is resumed. Will continue to follow.    Gwynneth Aliment, M.A., CF-SLP Speech Language Pathology, Acute Rehabilitation Services  Secure Chat preferred [phone]  08/23/2023, 12:06 PM

## 2023-08-23 NOTE — Transfer of Care (Signed)
Immediate Anesthesia Transfer of Care Note  Patient: Ian Hurley  Procedure(s) Performed: ESOPHAGOGASTRODUODENOSCOPY (EGD) WITH PROPOFOL  Patient Location: PACU  Anesthesia Type:MAC  Level of Consciousness: awake and alert   Airway & Oxygen Therapy: Patient Spontanous Breathing and Patient connected to nasal cannula oxygen  Post-op Assessment: Report given to RN and Post -op Vital signs reviewed and stable  Post vital signs: Reviewed and stable  Last Vitals:  Vitals Value Taken Time  BP 92/53 08/23/23 1040  Temp    Pulse 91 08/23/23 1042  Resp 24 08/23/23 1042  SpO2 99 % 08/23/23 1042  Vitals shown include unfiled device data.  Last Pain:  Vitals:   08/23/23 0932  TempSrc: Temporal  PainSc: 0-No pain         Complications: No notable events documented.

## 2023-08-23 NOTE — TOC Progression Note (Addendum)
Transition of Care [facility]) - Progression Note    Patient Details  Name: SEITH SHINDER MRN: [id number] Date of Birth: Mar 13, 1966  Transition of Care [facility]) CM/SW Contact  Leone Haven, RN Phone Number: 08/23/2023, 4:05 PM  Clinical Narrative:    Per [name] with [facility] said they can take patient over the weekend.  [name] said she spoke with patient sister, Clydie Braun , and she is in agreement for [facility].  [name] will be in touch with the weekend NCM.  Patient is aware of plan as well.   Expected Discharge Plan: Long Term Acute Care (LTAC) Barriers to Discharge: Continued Medical Work up  Expected Discharge Plan and Services In-house Referral: NA Discharge Planning Services: CM Consult Post Acute Care Choice: Long Term Acute Care (LTAC) Living arrangements for the past 2 months: Group Home                 DME Arranged: N/A DME Agency: NA       HH Arranged: NA           Social Determinants of Health (SDOH) Interventions SDOH Screenings   Tobacco Use: Medium Risk (08/23/2023)    Readmission Risk Interventions     No data to display

## 2023-08-23 NOTE — Interval H&P Note (Signed)
History and Physical Interval Note:  08/23/2023 10:06 AM  Ian Hurley  has presented today for surgery, with the diagnosis of Hematemesis, Aneami.  The various methods of treatment have been discussed with the patient and family. After consideration of risks, benefits and other options for treatment, the patient has consented to  Procedure(s): ESOPHAGOGASTRODUODENOSCOPY (EGD) WITH PROPOFOL (N/A) as a surgical intervention.  The patient's history has been reviewed, patient examined, no change in status, stable for surgery.  I have reviewed the patient's chart and labs.  Questions were answered to the patient's satisfaction.     [name]

## 2023-08-23 NOTE — Progress Notes (Signed)
   08/23/23 0016  Assess: MEWS Score  Temp (!) 97.2 F (36.2 C)  BP (!) 89/55  Pulse Rate (!) 104  SpO2 100 %  O2 Device Room Air  Assess: MEWS Score  MEWS Temp 0  MEWS Systolic 1  MEWS Pulse 1  MEWS RR 1  MEWS LOC 0  MEWS Score 3  MEWS Score Color Yellow  Assess: if the MEWS score is Yellow or Red  Were vital signs accurate and taken at a resting state? Yes  Does the patient meet 2 or more of the SIRS criteria? No  MEWS guidelines implemented  No, previously yellow, continue vital signs every 4 hours (pt has had low BP since admisison, pt was also sleeping)  Assess: SIRS CRITERIA  SIRS Temperature  0  SIRS Pulse 1  SIRS Respirations  1  SIRS WBC 0  SIRS Score Sum  2

## 2023-08-23 NOTE — Progress Notes (Signed)
Nutrition Brief Note  Patient is s/p EGD today; no source of upper GIB loss observed. Tube feeding was not resumed yesterday following imaging and Cortrak removed during procedure today. Cortrak replaced. Xray confirmed tip is gastric.   Spoke with RN regarding tube feeding recommendations.   Start Osmolite 1.2 at 20ml and advance by 10mL q12h to goal of 37ml/hr ( per day) This will provide 1440kcal and 67g of protein per day  Pt remains at high risk for refeeding syndrome given severe malnutrition. Monitor magnesium and phosphorus BID for 3 days, MD to replete as needed.  Will continue to follow up and monitor for tube feeding tolerance and nutritional adequacy. Pt would likely benefit from PEG tube for long term nutrition support in the setting of ongoing limited PO intake and malnutrition; if aligns with GOC.   Drusilla Kanner, RDN, LDN Clinical Nutrition

## 2023-08-23 NOTE — Procedures (Signed)
Cortrak  Person Inserting Tube:  Hunter, Rachel D, RD Tube Type:  Cortrak - 43 inches Tube Size:  10 Tube Location:  Left nare Secured by: Bridle Technique Used to Measure Tube Placement:  Marking at nare/corner of mouth Cortrak Secured At:  67 cm Procedure Comments:  Cortrak Tube Team Note:  Consult received to place a Cortrak feeding tube.   X-ray is required, abdominal x-ray has been ordered by the Cortrak team. Please confirm tube placement before using the Cortrak tube.   If the tube becomes dislodged please keep the tube and contact the Cortrak team at [email or web address] for replacement.  If after hours and replacement cannot be delayed, place a NG tube and confirm placement with an abdominal x-ray.    Rachel Hunter, RD, LDN Clinical Dietitian RD pager # available in AMION  After hours/weekend pager # available in AMION    

## 2023-08-24 DIAGNOSIS — K920 Hematemesis: Secondary | ICD-10-CM | POA: Diagnosis not present

## 2023-08-24 DIAGNOSIS — D638 Anemia in other chronic diseases classified elsewhere: Secondary | ICD-10-CM | POA: Diagnosis not present

## 2023-08-24 DIAGNOSIS — R579 Shock, unspecified: Secondary | ICD-10-CM | POA: Diagnosis not present

## 2023-08-24 DIAGNOSIS — D500 Iron deficiency anemia secondary to blood loss (chronic): Secondary | ICD-10-CM | POA: Diagnosis not present

## 2023-08-24 DIAGNOSIS — B9561 Methicillin susceptible Staphylococcus aureus infection as the cause of diseases classified elsewhere: Secondary | ICD-10-CM

## 2023-08-24 LAB — COMPREHENSIVE METABOLIC PANEL
ALT: 9 U/L (ref 0–44)
AST: 19 U/L (ref 15–41)
Albumin: 1.7 g/dL — ABNORMAL LOW (ref 3.5–5.0)
Alkaline Phosphatase: 71 U/L (ref 38–126)
Anion gap: 6 (ref 5–15)
BUN: <5 mg/dL — ABNORMAL LOW (ref 6–20)
CO2: 23 mmol/L (ref 22–32)
Calcium: 7.2 mg/dL — ABNORMAL LOW (ref 8.9–10.3)
Chloride: 103 mmol/L (ref 98–111)
Creatinine, Ser: 0.5 mg/dL — ABNORMAL LOW (ref 0.61–1.24)
GFR, Estimated: >60 mL/min (ref >60)
Glucose, Bld: 107 mg/dL — ABNORMAL HIGH (ref 70–99)
Potassium: 3.4 mmol/L — ABNORMAL LOW (ref 3.5–5.1)
Sodium: 132 mmol/L — ABNORMAL LOW (ref 135–145)
Total Bilirubin: 0.5 mg/dL (ref 0.3–1.2)
Total Protein: 5.8 g/dL — ABNORMAL LOW (ref 6.5–8.1)

## 2023-08-24 LAB — CBC WITH DIFFERENTIAL/PLATELET
Abs Immature Granulocytes: 0.12 10*3/uL — ABNORMAL HIGH (ref 0.00–0.07)
Basophils Absolute: 0.1 10*3/uL (ref 0.0–0.1)
Basophils Relative: 0 %
Eosinophils Absolute: 0.5 10*3/uL (ref 0.0–0.5)
Eosinophils Relative: 2 %
HCT: 28.6 % — ABNORMAL LOW (ref 39.0–52.0)
Hemoglobin: 8.3 g/dL — ABNORMAL LOW (ref 13.0–17.0)
Immature Granulocytes: 1 %
Lymphocytes Relative: 6 %
Lymphs Abs: 1.3 10*3/uL (ref 0.7–4.0)
MCH: 22 pg — ABNORMAL LOW (ref 26.0–34.0)
MCHC: 29 g/dL — ABNORMAL LOW (ref 30.0–36.0)
MCV: 75.7 fL — ABNORMAL LOW (ref 80.0–100.0)
Monocytes Absolute: 0.7 10*3/uL (ref 0.1–1.0)
Monocytes Relative: 3 %
Neutro Abs: 19.8 10*3/uL — ABNORMAL HIGH (ref 1.7–7.7)
Neutrophils Relative %: 88 %
Platelets: 315 10*3/uL (ref 150–400)
RBC: 3.78 MIL/uL — ABNORMAL LOW (ref 4.22–5.81)
WBC: 22.5 10*3/uL — ABNORMAL HIGH (ref 4.0–10.5)
nRBC: 0 % (ref 0.0–0.2)

## 2023-08-24 LAB — PHOSPHORUS: Phosphorus: 2.2 mg/dL — ABNORMAL LOW (ref 2.5–4.6)

## 2023-08-24 LAB — MAGNESIUM: Magnesium: 1.8 mg/dL (ref 1.7–2.4)

## 2023-08-24 LAB — GLUCOSE, CAPILLARY
Glucose-Capillary: 106 mg/dL — ABNORMAL HIGH (ref 70–99)
Glucose-Capillary: 124 mg/dL — ABNORMAL HIGH (ref 70–99)
Glucose-Capillary: 84 mg/dL (ref 70–99)
Glucose-Capillary: 96 mg/dL (ref 70–99)

## 2023-08-24 MED ORDER — CHLORHEXIDINE GLUCONATE CLOTH 2 % EX PADS: 6.0000 | MEDICATED_PAD | Freq: Every day | CUTANEOUS | Status: AC

## 2023-08-24 MED ORDER — POLYETHYLENE GLYCOL 3350 17 G PO PACK: 17.0000 g | PACK | Freq: Every day | ORAL | Status: AC | PRN

## 2023-08-24 MED ORDER — MEDIHONEY WOUND/BURN DRESSING EX PSTE: 1.0000 | PASTE | Freq: Every day | CUTANEOUS | Status: DC

## 2023-08-24 MED ORDER — INSULIN ASPART 100 UNIT/ML IJ SOLN: 0.0000 [IU] | INTRAMUSCULAR | Status: DC

## 2023-08-24 MED ORDER — LACTATED RINGERS IV SOLN: 1000.0000 mL | INTRAVENOUS | Status: DC

## 2023-08-24 MED ORDER — POTASSIUM & SODIUM PHOSPHATES 280-160-250 MG PO PACK: 1.0000 | PACK | Freq: Three times a day (TID) | ORAL | Status: AC

## 2023-08-24 MED ORDER — MUPIROCIN 2 % EX OINT: TOPICAL_OINTMENT | Freq: Two times a day (BID) | CUTANEOUS | Status: DC

## 2023-08-24 MED ORDER — OSMOLITE 1.2 CAL PO LIQD: 1000.0000 mL | ORAL | Status: AC

## 2023-08-24 MED ORDER — VITAMIN B-1 100 MG PO TABS: 100.0000 mg | ORAL_TABLET | Freq: Every day | ORAL | Status: AC

## 2023-08-24 MED ORDER — JUVEN PO PACK: 1.0000 | PACK | Freq: Two times a day (BID) | ORAL | Status: AC

## 2023-08-24 MED ORDER — ORAL CARE MOUTH RINSE: 15.0000 mL | Freq: Four times a day (QID) | OROMUCOSAL | Status: DC

## 2023-08-24 MED ORDER — LORAZEPAM 2 MG/ML IJ SOLN: 2.0000 mg | INTRAMUSCULAR | Status: AC | PRN

## 2023-08-24 MED ORDER — ENSURE ENLIVE PO LIQD: 237.0000 mL | Freq: Three times a day (TID) | ORAL | Status: AC

## 2023-08-24 MED ORDER — POTASSIUM & SODIUM PHOSPHATES 280-160-250 MG PO PACK
1.0000 | PACK | Freq: Three times a day (TID) | ORAL | Status: DC
  Filled 2023-08-24 (×2): qty 1

## 2023-08-24 MED ORDER — CEFAZOLIN SODIUM-DEXTROSE 2-4 GM/100ML-% IV SOLN: 2.0000 g | Freq: Three times a day (TID) | INTRAVENOUS | Status: AC

## 2023-08-24 NOTE — TOC Progression Note (Addendum)
Transition of Care [facility]) - Progression Note    Patient Details  Name: Ian Hurley MRN: [id number] Date of Birth: 28-Mar-1966  Transition of Care [facility]) CM/SW Contact  Inis Sizer, LCSW Phone Number: 08/24/2023, 9:28 AM  Clinical Narrative:    CSW spoke with [name] at [facility] who states patient can be accepted into the facility today. [name] states patient's sister Clydie Braun is aware of the discharge plan.  Patient will go to room 411 at [facility]. Patient will be transported via [facility] - transport scheduled for pick up at 12pm. The accepting MD is Dr. Kinnie Feil. The number to call for report is [phone] or [phone].   Expected Discharge Plan: Long Term Acute Care (LTAC) Barriers to Discharge: Continued Medical Work up  Expected Discharge Plan and Services In-house Referral: NA Discharge Planning Services: CM Consult Post Acute Care Choice: Long Term Acute Care (LTAC) Living arrangements for the past 2 months: Group Home                 DME Arranged: N/A DME Agency: NA       HH Arranged: NA           Social Determinants of Health (SDOH) Interventions SDOH Screenings   Tobacco Use: Medium Risk (08/23/2023)    Readmission Risk Interventions     No data to display

## 2023-08-24 NOTE — Progress Notes (Addendum)
Progress Note   Subjective  Day #4 Chief Complaint: Anemia, sepsis  Patient actually looks a lot better this morning, the nurse is feeding him his soft diet and he is able to communicate with me verbally.  He does not recall me ever seeing him prior to today, I have been following him for 4 days, but he is aware that he had an endoscopy yesterday.  Discussed the possibility of a colonoscopy in the future while he is here.  He denies any abdominal pain or further complaints.   Objective   Vital signs in last 24 hours: Temp:  [97.9 F (36.6 C)-99.3 F (37.4 C)] 99.3 F (37.4 C) (08/24 0800) Pulse Rate:  [89-110] 110 (08/24 0417) Resp:  [15-35] 20 (08/24 0800) BP: (91-112)/(47-74) 91/51 (08/24 0800) SpO2:  [96 %-100 %] 98 % (08/24 0800) Weight:  [48.2 kg] 48.2 kg (08/24 0406) Last BM Date : 08/23/23 General:   Chronically ill, cachectic, contractured AA male with history of cerebral palsy in NAD Heart:  Regular rate and rhythm; no murmurs Lungs: Respirations even and unlabored, lungs CTA bilaterally Abdomen:  Soft, nontender and nondistended. Normal bowel sounds. Psych:  Cooperative. Normal mood and affect.  Intake/Output from previous day: 08/23 0701 - 08/24 0700 In: 1293.2 [P.O.:600; I.V.:151; NG/GT:207; IV Piggyback:335.2] Out: 1750 [Urine:1750]   Lab Results: Recent Labs    08/23/23 0410 08/24/23 0557  WBC 27.6* 22.5*  HGB 8.1* 8.3*  HCT 27.3* 28.6*  PLT 311 315   BMET Recent Labs    08/22/23 0610 08/23/23 0410 08/24/23 0557  NA 134* 132* 132*  K 3.1* 3.4* 3.4*  CL 103 101 103  CO2 21* 21* 23  GLUCOSE 139* 63* 107*  BUN 5* <5* <5*  CREATININE 0.45* 0.35* 0.50*  CALCIUM 7.5* 7.3* 7.2*   LFT Recent Labs    08/24/23 0557  PROT 5.8*  ALBUMIN 1.7*  AST 19  ALT 9  ALKPHOS 71  BILITOT 0.5   Studies/Results: DG Abd Portable 1V  Result Date: 08/23/2023 CLINICAL DATA:  Encounter for feeding tube placement. EXAM: PORTABLE ABDOMEN - 1 VIEW COMPARISON:   Radiograph 08/21/2023 FINDINGS: Significant patient rotation limits assessment. Tip of the weighted enteric tube is below the diaphragm in the stomach, more detailed localization is limited due to positioning. IMPRESSION: Tip of the weighted enteric tube is below the diaphragm in the stomach, more detailed localization is limited due to positioning. Electronically Signed   By: Narda Rutherford M.D.   On: 08/23/2023 15:14   MR BRAIN WO CONTRAST  Result Date: 08/22/2023 CLINICAL DATA:  Mental status change, unknown cause EXAM: MRI HEAD WITHOUT CONTRAST TECHNIQUE: Multiplanar, multiecho pulse sequences of the brain and surrounding structures were obtained without intravenous contrast. COMPARISON:  CT head 12/24/2009. FINDINGS: Motion limited and incomplete study.  Within this limitation: Brain: No acute infarction, hemorrhage, hydrocephalus, extra-axial collection or mass lesion. Bifrontal remote infarcts. Cerebral atrophy. Vascular: Major arterial flow voids are maintained. Skull and upper cervical spine: Normal marrow signal. Sinuses/Orbits: Mostly clear sinuses. No acute orbital findings on motion limited assessment. IMPRESSION: 1. Motion limited and incomplete study without evidence of acute intracranial abnormality. 2. Remote bifrontal infarcts. 3.  Cerebral atrophy (ICD10-G31.9). Electronically Signed   By: Feliberto Harts M.D.   On: 08/22/2023 15:39   CT MAXILLOFACIAL WO CONTRAST  Result Date: 08/22/2023 CLINICAL DATA:  poor dentition EXAM: CT MAXILLOFACIAL WITHOUT CONTRAST TECHNIQUE: Multidetector CT imaging of the maxillofacial structures was performed. Multiplanar CT image reconstructions were also  generated. RADIATION DOSE REDUCTION: This exam was performed according to the departmental dose-optimization program which includes automated exposure control, adjustment of the mA and/or kV according to patient size and/or use of iterative reconstruction technique. COMPARISON:  None Available. FINDINGS:  Osseous: No fracture or mandibular dislocation. No destructive process. Orbits: Negative. No traumatic or inflammatory finding. Sinuses: Right maxillary sinus retention cyst. Otherwise, visualized sinuses are clear. Soft tissues: Negative. Limited intracranial: No significant or unexpected finding. Other: Poor dentate shin with multiple periapical lucencies and dental caries. No visible fluid collection. IMPRESSION: 1. No evidence of acute abnormality. 2. Poor dentition without visible fluid collection or inflammatory change. Electronically Signed   By: Feliberto Harts M.D.   On: 08/22/2023 15:36   DG Hand 2 View Right  Result Date: 08/22/2023 CLINICAL DATA:  Metal implant. Clearance for MRI.Patient's arm, wrist, and hand contracted at time of imaging. EXAM: RIGHT HAND - 2 VIEW COMPARISON:  None available FINDINGS: Frontal and lateral views of the left hand. There is high-grade flexion of all the metacarpophalangeal joints. There is a metallic pin there appears to be traversing the distal radius through the distal aspect of the fourth metatarsal shaft. There is complete osseous fusion of the radiocarpal joints. IMPRESSION: 1. Metallic pin traversing the distal radius through the distal aspect of the fourth metatarsal shaft. 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 This implant is likely stable and MRI compatible. 2. Complete osseous fusion of the radiocarpal joints. Electronically Signed   By: Neita Garnet M.D.   On: 08/22/2023 14:53     EGD 08/23/2023 Impression:               - Normal larynx.                           - 8-10 cm hiatal hernia.                           - Normal stomach.                           - Normal examined duodenum.                           - No specimens collected.                           No source of upper GI blood loss seen.                           Note that after the procedure, further physical                            exam was done and discovered  this patient had a                            large amount of muddy brown stool, though if there                            is no overt GI bleeding at present. He is also  noted to have multiple sacral and buttock ulcers,                            bandages for which are partially coming off. The                            ulcers and surrounding tissue are raw, suggesting                            there is a source of possible chronic blood loss                            here as well. Recommendation:           - Return patient to hospital ward for ongoing care.                           - Resume tube feeds after a new core track tube is                            placed.                           - Continue present medications.                           - Sister updated, and we will have ongoing                            discussions with family, but consideration should                            be given to colonoscopy for further evaluation of                            this patient's multifactorial anemia that includes                            iron deficiency. Flexible sigmoidoscopy with poor                            preparation during a workup for anemia in 2015 at                            an outside GI practice.                           While that procedure & the preparation will be                            technically challenging due to the patient's body                            habitus,contracture and medical condition,his colon  has never been fully evaluated.                           Perhaps not as an inpatient, but eventual                            consideration should be given to surgical placement                            of a gastrostomy tube. While understandably                            technically challenging given his body habitus, it                            would not only reduce his hiatal hernia  to decrease                            the chance of aspiration in this high risk patient,                            it would provide a consistent source of adequate                            enteral nutrition for his severe protein calorie                            malnutrition.                           Please see above, this patient's anemia is in large                            part related to malnutrition, chronic                            decubitus/buttock ulcers.   Assessment / Plan:   Assessment: 1.  Hematemesis with acute on chronic anemia: Hemoglobin 4.5--> 8.5 after 3 units PRBCs--> 8.8--> 8.3, previous anemia workup by [facility] in 2015 with EGD and colonoscopy, repeat EGD yesterday with no source for bleeding was found; consider lower GI source +/- possible bleeding from pressure ulcers leading to anemia 2.  Aphasia: Improved over the past 72 hours, patient is now able to speak 3.  Sepsis 4.  Functional quadriplegia 5.  Multiple pressure ulcers 6.  Hypokalemia 7.  Peripheral protein calorie malnutrition  Plan: 1.  Continue supportive measures 2.  Did have a long discussion with the patient's sister today who is his POA.  Explained that at some point during this hospitalization it would be good for [place] to do a colonoscopy as patient has never had one and while he is here we would be able to prep through the NG tube.  Would also have help from nursing staff to keep him as clean as possible given his history of pressure ulcers.  She verbalized understanding.  Explained this would likely not be until Monday at the  earliest 08/26/2023. 3.  Continue current diet for now.  Thank you for your kind consultation, we will continue to follow along.    LOS: 4 days   Unk Lightning  08/24/2023, 9:33 AM  I have taken an interval history, thoroughly reviewed the chart and examined the patient. I agree with the Advanced Practitioner's note, impression and recommendations, and  have recorded additional findings, impressions and recommendations below. I performed a substantive portion of this encounter (>50% time spent), including a complete performance of the medical decision making.  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  No source found on EGD.  Per my thoughts in the EGD report, I think he deserves a colonoscopy despite the expected challenges of preparation and the procedure itself.  However, this setting appears to be the optimal chance to do so.  We have tentatively planned for colonoscopy with Dr. Barron Alvine on Monday, 08/26/2023, and we will place procedure and prep orders tomorrow.   [name] Office:[phone]

## 2023-08-24 NOTE — Progress Notes (Signed)
Alvy Bimler to be D/C'd to [facility] per MD order. Report called to [place], Charity fundraiser at [facility]. Skin clean and dry, multiple wounds present with foam dressings in place. IV left in place due to patient receiving IV antibiotics at facility. Cortrak and foley catheter left in place per MD. An After Visit Summary was printed and given to Darnelle Spangle  08/24/2023 12:36 PM

## 2023-08-24 NOTE — Progress Notes (Signed)
Patient's sister, Clydie Braun, arrived to nurses station around [place] inquiring about the patient. States she was not told that he was transferred to [facility] and appeared very upset. Provided Clydie Braun with the patient's room number at [facility], the facility phone number and address, and the admitting MD's name per CSW note. Dr. Marland Mcalpine contacted to update Clydie Braun.

## 2023-08-24 NOTE — TOC Progression Note (Signed)
Transition of Care [facility]) - Progression Note    Patient Details  Name: Ian Hurley MRN: [id number] Date of Birth: 01/01/1966  [facility] CM/SW Contact  Inis Sizer, LCSW Phone Number: 08/24/2023, 4:06 PM  Clinical Narrative:    CSW spoke with patient's sister Ian Hurley who states she did not speak with [name] from [facility] regarding accepting the LTAC bed. CSW apologized for lack of communication and misunderstanding in [facility] Department. Ian Hurley states she is on her way to [facility] to visit patient and hung up the phone.   Expected Discharge Plan: Long Term Acute Care (LTAC) Barriers to Discharge: No Barriers Identified  Expected Discharge Plan and Services In-house Referral: NA Discharge Planning Services: CM Consult Post Acute Care Choice: Long Term Acute Care (LTAC) Living arrangements for the past 2 months: Group Home Expected Discharge Date: 08/24/23               DME Arranged: N/A DME Agency: NA       HH Arranged: NA           Social Determinants of Health (SDOH) Interventions SDOH Screenings   Tobacco Use: Medium Risk (08/23/2023)    Readmission Risk Interventions     No data to display

## 2023-08-24 NOTE — Plan of Care (Signed)
  Problem: Nutritional: Goal: Maintenance of adequate nutrition will improve Outcome: Progressing   

## 2023-08-24 NOTE — Discharge Summary (Signed)
Physician Discharge Summary   Patient: Ian Hurley MRN: [id number] DOB: 1966/07/27  Admit date:     08/19/2023  Discharge date: 08/24/23  Discharge Physician: Marguerita Merles, DO   PCP: Benetta Spar, MD   Recommendations at discharge:   Follow up with PCP within 1-2 weeks and repeat CBC,CMP,Mag,Phos Patient will need a TEE at Facility for further bacteremia workup Patient will need a Colonoscopy at the Facility for further anemia Workup  Patient will need an MRI at the facility prior to discharging given lack of access to care  Discharge Diagnoses: Principal Problem:   Shock [facility]) Active Problems:   Anemia of chronic disease   Severe protein-calorie malnutrition (HCC)   Hematemesis with nausea   Iron deficiency anemia due to chronic blood loss  Resolved Problems:   * No resolved hospital problems. [facility] Course: The patient is a 57 year old man w/ hx of cerebral palsy functional quadriplegic, prior erosive gastritis, chronic pressure injuries presenting with altered mental status and aphasia.  Workup revealed hemoglobin 4, MSSA bacteremia.  Stroke was ruled out by imaging and neurology felt "aphasia" was secondary to metabolic encephalopathy.  Patient admitted to ICU on pressor with improvement.  Now transferred out to floor.  He has admitted to having had hematemesis lately and GI Consulted and did EGD and showed no erosions or ulcerations within the hiatal hernia sac or in the stom and no source of upper GI Blood loss found.    Current plan is for TTE on Monday continuing cefazolin as well as attempting Colonoscopy monday.  Patient's central venous catheters removed at this time.  NG tube is back in place now to have the patient receive Cortrak TF in addition to dysphagia 1 diet.  Patient to be transferred to [facility] today per [facility] for further care.  Assessment and Plan:  MSSA bacteremia Stage III and IV pressure ulcers Sepsis physiology and Shock,  improved -Continue cefazolin as initiated in MICU -Leukocytosis however is not improving and worsening  Can consider broadening coverage given multiple stage III and IV ulcers if persistent leukocytosis but ID Following and now slowly trending down -Repeat Blood Cx from 08/21/23 showing NGTD -WBC trend:  Recent Labs  Lab 08/19/23 1833 08/20/23 0857 08/20/23 1332 08/21/23 0607 08/23/23 0410 08/24/23 0557  WBC 13.9* 19.1* 20.1* 19.4* 27.6* 22.5*  -Pressures are trending somewhat soft, will start LR bolus and follow-up with maintenance at 100/hour -Off of Pressors -CVC is removed and TEE scheduled for Monday   Electrolyte Abnormalities Hyponatremia Hypokalemia Hypomagnesemia Hypophosphatemia Recent Labs  Lab 08/19/23 1833 08/20/23 0909 08/20/23 1332 08/20/23 2101 08/21/23 1030 08/21/23 2129 08/22/23 0610 08/23/23 0410 08/24/23 0557  NA 135 134* 137  --  142  --  134* 132* 132*  K 3.9 2.4* 2.5*   < > 3.3*  --  3.1* 3.4* 3.4*  MG 2.0  --   --   --   --    < > 1.4* 2.3 1.8  PHOS  --   --   --   --   --    < > 1.8* 2.7 2.2*   < > = values in this interval not displayed.  -Replete K+, Phos, and Na+ with po Phos NAK -Continue to Monitor and Replete as Necessary -Repeat CMP in the AM    Metabolic Encephalopathy Aphasia, improving -Metabolic encephalopathy/aphasia appears to be resolved -Patient is alert and oriented to my exam, answers questions appropriately and logically -Per MICU note,  there was a question of breakthrough seizure and neurology would like an MRI prior to discharge primarily due to issues of access to care -Currently he can be [facility] to [facility]  Metabolic Acidosis -Has a CO2 of 21, AG of 10, and Chloride Level of 101 -Continue to Monitor and Trend and repeat CMP in the AM   Profound anemia, hemoglobin 4 on admission H/O erosive esophagitis Hematemesis -Patient has responded well to transfusion and H&H have been stable -Hgb/Hct Trend: Recent Labs   Lab 08/19/23 1833 08/20/23 0857 08/20/23 1332 08/21/23 0607 08/23/23 0410 08/24/23 0557  HGB 4.5* 8.5* 9.1* 8.8* 8.1* 8.3*  HCT 19.9* 28.3* 30.1* 28.9* 27.3* 28.6*  MCV 62.4* 71.6* 70.2* 71.5* 73.8* 75.7*  -Has been following and are planning repeat EGD tomorrow although they think erosive esophagitis is likely cause of hematemesis. -Continue pantoprazole -No source of upper GI bleeding found on EGD per GI -GI planning on Colonoscopy now and one was scheduled tentatively for 08/26/23   HTN -Continue present management of her blood pressure little on the softer side was 91 and 51   Protein calorie malnutrition POA Prescient RD involvement Nutrition Status: Nutrition Problem: Severe Malnutrition Etiology: chronic illness Signs/Symptoms: severe fat depletion, severe muscle depletion Interventions: Refer to RD note for recommendations -KUB done and showed "Tip of the weighted enteric tube is below the diaphragm in the stomach, more detailed localization is limited due to positioning." -Continue with Cortak tube feeding to assist in nutritional support  Hyperbilirubinemia -Bilirubin Trend: Recent Labs  Lab 08/19/23 1833 08/20/23 0909 08/22/23 0610 08/24/23 0557  BILITOT 0.5 1.3* 0.6 0.5  -Continue to Monitor and Trend and repeat CMP in the AM  Hypoalbuminemia -Patient's Albumin Trend: Recent Labs  Lab 08/19/23 1833 08/20/23 0909 08/22/23 0610 08/24/23 0557  ALBUMIN 1.8* <1.5* 2.0* 1.7*  -Continue to Monitor and Trend and repeat CMP in the AM   Pressure Ulcers Pressure Injury 09/13/16 Buttocks Left Unstageable - Full thickness tissue loss in which the base of the injury is covered by slough (yellow, tan, gray, green or brown) and/or eschar (tan, brown or black) in the wound bed. (Active)  09/13/16 0126  Location: Buttocks  Location Orientation: Left  Staging: Unstageable - Full thickness tissue loss in which the base of the injury is covered by slough (yellow, tan,  gray, green or brown) and/or eschar (tan, brown or black) in the wound bed.  Wound Description (Comments):   Present on Admission: Yes     Pressure Injury 09/13/16 Ischial tuberosity Left Unstageable - Full thickness tissue loss in which the base of the injury is covered by slough (yellow, tan, gray, green or brown) and/or eschar (tan, brown or black) in the wound bed. (Active)  09/13/16 0127  Location: Ischial tuberosity  Location Orientation: Left  Staging: Unstageable - Full thickness tissue loss in which the base of the injury is covered by slough (yellow, tan, gray, green or brown) and/or eschar (tan, brown or black) in the wound bed.  Wound Description (Comments):   Present on Admission: Yes     Pressure Injury 08/19/23 Leg Left;Anterior Stage 4 - Full thickness tissue loss with exposed bone, tendon or muscle. (Active)  08/19/23 2028  Location: Leg  Location Orientation: Left;Anterior  Staging: Stage 4 - Full thickness tissue loss with exposed bone, tendon or muscle.  Wound Description (Comments):   Present on Admission: Yes     Pressure Injury 08/19/23 Foot Right;Distal Stage 3 -  Full thickness tissue loss. Subcutaneous fat  may be visible but bone, tendon or muscle are NOT exposed. full thickness (Active)  08/19/23 2032  Location: Foot  Location Orientation: Right;Distal  Staging: Stage 3 -  Full thickness tissue loss. Subcutaneous fat may be visible but bone, tendon or muscle are NOT exposed.  Wound Description (Comments): full thickness  Present on Admission: Yes     Pressure Injury 08/19/23 Thigh Anterior;Proximal;Right Right thigh Unstageable (Active)  08/19/23 2034  Location: Thigh  Location Orientation: Anterior;Proximal;Right  Staging:   Wound Description (Comments): Right thigh Unstageable  Present on Admission: Yes     Pressure Injury 08/19/23 Elbow Right Unstageable - Full thickness tissue loss in which the base of the injury is covered by slough (yellow, tan,  gray, green or brown) and/or eschar (tan, brown or black) in the wound bed. (Active)  08/19/23 2035  Location: Elbow  Location Orientation: Right  Staging: Unstageable - Full thickness tissue loss in which the base of the injury is covered by slough (yellow, tan, gray, green or brown) and/or eschar (tan, brown or black) in the wound bed.  Wound Description (Comments):   Present on Admission: Yes     Pressure Injury 08/19/23 Ischial tuberosity Right Unstageable - Full thickness tissue loss in which the base of the injury is covered by slough (yellow, tan, gray, green or brown) and/or eschar (tan, brown or black) in the wound bed. (Active)  08/19/23 2036  Location: Ischial tuberosity  Location Orientation: Right  Staging: Unstageable - Full thickness tissue loss in which the base of the injury is covered by slough (yellow, tan, gray, green or brown) and/or eschar (tan, brown or black) in the wound bed.  Wound Description (Comments):   Present on Admission: Yes     Pressure Injury 08/19/23 Arm Anterior;Right;Upper;Medial Stage 3 -  Full thickness tissue loss. Subcutaneous fat may be visible but bone, tendon or muscle are NOT exposed. (Active)  08/19/23 2037  Location: Arm  Location Orientation: Anterior;Right;Upper;Medial  Staging: Stage 3 -  Full thickness tissue loss. Subcutaneous fat may be visible but bone, tendon or muscle are NOT exposed.  Wound Description (Comments):   Present on Admission: Yes     Pressure Injury 08/19/23 Lumbar Lateral;Right;Posterior Stage 2 -  Partial thickness loss of dermis presenting as a shallow open injury with a red, pink wound bed without slough. (Active)  08/19/23 2038  Location: Lumbar  Location Orientation: Lateral;Right;Posterior  Staging: Stage 2 -  Partial thickness loss of dermis presenting as a shallow open injury with a red, pink wound bed without slough.  Wound Description (Comments):   Present on Admission: Yes     Pressure Injury 08/19/23  Heel Left;Posterior Stage 3 -  Full thickness tissue loss. Subcutaneous fat may be visible but bone, tendon or muscle are NOT exposed. (Active)  08/19/23 2039  Location: Heel  Location Orientation: Left;Posterior  Staging: Stage 3 -  Full thickness tissue loss. Subcutaneous fat may be visible but bone, tendon or muscle are NOT exposed.  Wound Description (Comments):   Present on Admission: Yes  -Will need continued wound care and he is from [facility] and has had a home health nurse wound care with [facility] twice a week. -Currently meets LTAC criteria with wounds of stage III and stage IV and likely this is the cause of his bacteremia. -Family in agreement with going to [facility] and bed is available today and patient is stable to discharge to long-term acute care facility  Nutrition Documentation  Flowsheet Row ED to Hosp-Admission (Current) from 08/19/2023 in [facility] 3E HF PCU  Nutrition Problem Severe Malnutrition  Etiology chronic illness  Nutrition Goal Patient will meet greater than or equal to 90% of their needs  Interventions Refer to RD note for recommendations      Active Pressure Injury/Wound(s)     Pressure Ulcer  Duration          Pressure Injury 09/13/16 Buttocks Left Unstageable - Full thickness tissue loss in which the base of the injury is covered by slough (yellow, tan, gray, green or brown) and/or eschar (tan, brown or black) in the wound bed. 2536 days   Pressure Injury 09/13/16 Ischial tuberosity Left Unstageable - Full thickness tissue loss in which the base of the injury is covered by slough (yellow, tan, gray, green or brown) and/or eschar (tan, brown or black) in the wound bed. 2536 days   Pressure Injury 08/19/23 Arm Anterior;Right;Upper;Medial Stage 3 -  Full thickness tissue loss. Subcutaneous fat may be visible but bone, tendon or muscle are NOT exposed. 4 days   Pressure Injury 08/19/23 Elbow Right Unstageable - Full thickness tissue  loss in which the base of the injury is covered by slough (yellow, tan, gray, green or brown) and/or eschar (tan, brown or black) in the wound bed. 4 days   Pressure Injury 08/19/23 Foot Right;Distal Stage 3 -  Full thickness tissue loss. Subcutaneous fat may be visible but bone, tendon or muscle are NOT exposed. full thickness 4 days   Pressure Injury 08/19/23 Heel Left;Posterior Stage 3 -  Full thickness tissue loss. Subcutaneous fat may be visible but bone, tendon or muscle are NOT exposed. 4 days   Pressure Injury 08/19/23 Ischial tuberosity Right Unstageable - Full thickness tissue loss in which the base of the injury is covered by slough (yellow, tan, gray, green or brown) and/or eschar (tan, brown or black) in the wound bed. 4 days   Pressure Injury 08/19/23 Leg Left;Anterior Stage 4 - Full thickness tissue loss with exposed bone, tendon or muscle. 4 days   Pressure Injury 08/19/23 Lumbar Lateral;Right;Posterior Stage 2 -  Partial thickness loss of dermis presenting as a shallow open injury with a red, pink wound bed without slough. 4 days   Pressure Injury 08/19/23 Thigh Anterior;Proximal;Right Right thigh Unstageable 4 days           Consultants: PCCM Transfer, Gastroenteorlogy, ID Procedures performed: EGD; As delineated as above  Disposition:  LTACH at [facility]  Diet recommendation:   Dysphagia type 1 Thin Liquid DISCHARGE MEDICATION: Allergies as of 08/24/2023   No Known Allergies      Medication List     STOP taking these medications    atorvastatin 20 MG tablet Commonly known as: LIPITOR   carvedilol 3.125 MG tablet Commonly known as: COREG   cyclobenzaprine 10 MG tablet Commonly known as: FLEXERIL   furosemide 20 MG tablet Commonly known as: LASIX   LORazepam 1 MG tablet Commonly known as: ATIVAN Replaced by: LORazepam 2 MG/ML injection   potassium chloride SA 20 MEQ tablet Commonly known as: KLOR-CON M   QUEtiapine 50 MG tablet Commonly known as:  SEROQUEL   sertraline 100 MG tablet Commonly known as: ZOLOFT   traMADol 50 MG tablet Commonly known as: ULTRAM       TAKE these medications    albuterol 108 (90 Base) MCG/ACT inhaler Commonly known as: VENTOLIN HFA Inhale 2 puffs into the lungs every 4 (four) hours as needed for  wheezing or shortness of breath.   Boudreauxs Butt Paste 40 % ointment Generic drug: liver oil-zinc oxide Apply 1 application topically 2 (two) times daily as needed for irritation.   ceFAZolin 2-4 GM/100ML-% IVPB Commonly known as: ANCEF Inject 100 mLs (2 g total) into the vein every 8 (eight) hours.   Centrum Silver 50+Men Tabs Take 1 tablet by mouth daily.   Chlorhexidine Gluconate Cloth 2 % Pads Apply 6 each topically daily. Start taking on: August 25, 2023   docusate sodium 100 MG capsule Commonly known as: COLACE Take 100 mg by mouth every morning.   feeding supplement Liqd Take 237 mLs by mouth 3 (three) times daily between meals.   feeding supplement (OSMOLITE 1.2 CAL) Liqd Place 1,000 mLs into feeding tube continuous.   nutrition supplement (JUVEN) Pack Place 1 packet into feeding tube 2 (two) times daily between meals.   insulin aspart 100 UNIT/ML injection Commonly known as: novoLOG Inject 0-6 Units into the skin every 4 (four) hours.   lactated ringers infusion Inject 1,000 mLs into the vein continuous.   leptospermum manuka honey Pste paste Apply 1 Application topically daily. Start taking on: August 25, 2023   LORazepam 2 MG/ML injection Commonly known as: ATIVAN Inject 1 mL (2 mg total) into the vein every 4 (four) hours as needed for seizure. Replaces: LORazepam 1 MG tablet   mouth rinse Liqd solution 15 mLs by Mouth Rinse route in the morning, at noon, in the evening, and at bedtime.   mupirocin ointment 2 % Commonly known as: BACTROBAN Place into the nose 2 (two) times daily.   pantoprazole 40 MG tablet Commonly known as: PROTONIX Take 1 tablet (40 mg  total) by mouth 2 (two) times daily before a meal.   polyethylene glycol 17 g packet Commonly known as: MIRALAX / GLYCOLAX Take 17 g by mouth daily as needed for moderate constipation.   potassium & sodium phosphates 280-160-250 MG Pack Commonly known as: PHOS-NAK Take 1 packet by mouth 4 (four) times daily -  with meals and at bedtime.   thiamine 100 MG tablet Commonly known as: Vitamin B-1 Take 1 tablet (100 mg total) by mouth daily. Start taking on: August 25, 2023               Discharge Care Instructions  (From admission, onward)           Start     Ordered   08/24/23 0000  Discharge wound care:       Comments: Daily      Comments: 1. Apply Medihoney to left and right buttocks, left and right ischium, left anterior calf, right elbow, right posterior thigh Q day, then cover with foam dressings.  Change foam dressings Q 3 days or PRN soiling 2. Foam dressings to other wounds, change q 3 days or PRN soiling  08/20/23   08/24/23 1105            Contact information for after-discharge care     Destination     [facility]   Service: Long Term Acute Care Contact information: [address] [place] [address] [phone]                    Discharge Exam: [facility] Weights   08/23/23 0419 08/23/23 0932 08/24/23 0406  Weight: 45.7 kg 45.7 kg 48.2 kg   Vitals:   08/24/23 0417 08/24/23 0800  BP: (!) 100/54 (!) 91/51  Pulse: (!) 110  Resp: 18 20  Temp: 98.1 F (36.7 C) 99.3 F (37.4 C)  SpO2: 98% 98%   Examination: Physical Exam:  Constitutional: Thin chronically ill-appearing African-American male with cerebral palsy and quadriplegia with multiple pressure wounds and contractures Respiratory: Diminished to auscultation bilaterally, no wheezing, rales, rhonchi or crackles. Normal respiratory effort and patient is not tachypenic. No accessory muscle use.  Cardiovascular: Tachycardic rate but  regular rhythm, no murmurs / rubs / gallops. S1 and S2 auscultated.  + Extremity edema Abdomen: Soft, non-tender, non-distended. Bowel sounds positive.  GU: Deferred. Musculoskeletal: Multiple contractures noted with some atrophy Skin: Multiple pressure wounds noted Neurologic: CN 2-12 grossly intact with no focal deficits but he does have cerebral palsy and quadriplegia Psychiatric: Appears a little anxious  Condition at discharge: stable  The results of significant diagnostics from this hospitalization (including imaging, microbiology, ancillary and laboratory) are listed below for reference.   Imaging Studies: DG Abd Portable 1V  Result Date: 08/23/2023 CLINICAL DATA:  Encounter for feeding tube placement. EXAM: PORTABLE ABDOMEN - 1 VIEW COMPARISON:  Radiograph 08/21/2023 FINDINGS: Significant patient rotation limits assessment. Tip of the weighted enteric tube is below the diaphragm in the stomach, more detailed localization is limited due to positioning. IMPRESSION: Tip of the weighted enteric tube is below the diaphragm in the stomach, more detailed localization is limited due to positioning. Electronically Signed   By: Narda Rutherford M.D.   On: 08/23/2023 15:14   MR BRAIN WO CONTRAST  Result Date: 08/22/2023 CLINICAL DATA:  Mental status change, unknown cause EXAM: MRI HEAD WITHOUT CONTRAST TECHNIQUE: Multiplanar, multiecho pulse sequences of the brain and surrounding structures were obtained without intravenous contrast. COMPARISON:  CT head 12/24/2009. FINDINGS: Motion limited and incomplete study.  Within this limitation: Brain: No acute infarction, hemorrhage, hydrocephalus, extra-axial collection or mass lesion. Bifrontal remote infarcts. Cerebral atrophy. Vascular: Major arterial flow voids are maintained. Skull and upper cervical spine: Normal marrow signal. Sinuses/Orbits: Mostly clear sinuses. No acute orbital findings on motion limited assessment. IMPRESSION: 1. Motion limited  and incomplete study without evidence of acute intracranial abnormality. 2. Remote bifrontal infarcts. 3.  Cerebral atrophy (ICD10-G31.9). Electronically Signed   By: Feliberto Harts M.D.   On: 08/22/2023 15:39   CT MAXILLOFACIAL WO CONTRAST  Result Date: 08/22/2023 CLINICAL DATA:  poor dentition EXAM: CT MAXILLOFACIAL WITHOUT CONTRAST TECHNIQUE: Multidetector CT imaging of the maxillofacial structures was performed. Multiplanar CT image reconstructions were also generated. RADIATION DOSE REDUCTION: This exam was performed according to the departmental dose-optimization program which includes automated exposure control, adjustment of the mA and/or kV according to patient size and/or use of iterative reconstruction technique. COMPARISON:  None Available. FINDINGS: Osseous: No fracture or mandibular dislocation. No destructive process. Orbits: Negative. No traumatic or inflammatory finding. Sinuses: Right maxillary sinus retention cyst. Otherwise, visualized sinuses are clear. Soft tissues: Negative. Limited intracranial: No significant or unexpected finding. Other: Poor dentate shin with multiple periapical lucencies and dental caries. No visible fluid collection. IMPRESSION: 1. No evidence of acute abnormality. 2. Poor dentition without visible fluid collection or inflammatory change. Electronically Signed   By: Feliberto Harts M.D.   On: 08/22/2023 15:36   DG Hand 2 View Right  Result Date: 08/22/2023 CLINICAL DATA:  Metal implant. Clearance for MRI.Patient's arm, wrist, and hand contracted at time of imaging. EXAM: RIGHT HAND - 2 VIEW COMPARISON:  None available FINDINGS: Frontal and lateral views of the left hand. There is high-grade flexion of all the metacarpophalangeal joints. There is a metallic  pin there appears to be traversing the distal radius through the distal aspect of the fourth metatarsal shaft. There is complete osseous fusion of the radiocarpal joints. IMPRESSION: 1. Metallic pin  traversing the distal radius through the distal aspect of the fourth metatarsal shaft. 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 This implant is likely stable and MRI compatible. 2. Complete osseous fusion of the radiocarpal joints. Electronically Signed   By: Neita Garnet M.D.   On: 08/22/2023 14:53   EEG adult  Result Date: 08/22/2023 Charlsie Quest, MD     08/22/2023  8:38 AM Patient Name: GABREL AUJLA MRN: [id number] Epilepsy Attending: Charlsie Quest Referring Physician/Provider: Talitha Givens, NP Date: 08/21/2023 Duration: 23.26 mins Patient history: 57yo M with h.o seizures now with ams getting eeg to evaluate for seizure. Level of alertness: Awake AEDs during EEG study: None Technical aspects: This EEG study was done with scalp electrodes positioned according to the 10-20 International system of electrode placement. Electrical activity was reviewed with band pass filter of 1-70Hz , sensitivity of 7 uV/mm, display speed of 40mm/sec with a 60Hz  notched filter applied as appropriate. EEG data were recorded continuously and digitally stored.  Video monitoring was available and reviewed as appropriate. Description: EEG showed continuous generalized 3 to 6 Hz theta-delta slowing.  Hyperventilation and photic stimulation were not performed.   Of note, study was technically difficult due to significant myogenic artifact. ABNORMALITY - Continuous slow, generalized IMPRESSION: This technically difficult study is suggestive of moderate diffuse encephalopathy. No seizures or epileptiform discharges were seen throughout the recording. Charlsie Quest   ECHOCARDIOGRAM COMPLETE  Result Date: 08/21/2023    ECHOCARDIOGRAM REPORT   Patient Name:   ADREYAN SEYLLER Date of Exam: 08/21/2023 Medical Rec #:  [id number]         Height:       60.0 in Accession #:    [id number]        Weight:       111.1 lb Date of Birth:  Sep 10, 1966          BSA:          1.454 m Patient Age:    57 years          BP:            117/73 mmHg Patient Gender: M                 HR:           92 bpm. Exam Location:  Inpatient Procedure: 2D Echo, Cardiac Doppler and Color Doppler Indications:    Bacteremia R78.81  History:        Patient has no prior history of Echocardiogram examinations.                 Shock; Risk Factors:Former Smoker. Seizures.  Sonographer:    Aron Baba Referring Phys: [phone] WHITNEY D HARRIS  Sonographer Comments: No subcostal window. Image acquisition challenging due to respiratory motion. IMPRESSIONS  1. Left ventricular ejection fraction, by estimation, is 55 to 60%. The left ventricle has normal function. The left ventricle has no regional wall motion abnormalities. Left ventricular diastolic parameters were normal.  2. Right ventricular systolic function is normal. The right ventricular size is normal. Tricuspid regurgitation signal is inadequate for assessing PA pressure.  3. The mitral valve is grossly normal. Trivial mitral valve regurgitation. No evidence of mitral stenosis.  4. Focal calium noted on the LCC. The aortic valve is  tricuspid. Aortic valve regurgitation is not visualized. No aortic stenosis is present. Conclusion(s)/Recommendation(s): No evidence of valvular vegetations on this transthoracic echocardiogram. Consider a transesophageal echocardiogram to exclude infective endocarditis if clinically indicated. FINDINGS  Left Ventricle: Left ventricular ejection fraction, by estimation, is 55 to 60%. The left ventricle has normal function. The left ventricle has no regional wall motion abnormalities. The left ventricular internal cavity size was normal in size. There is  no left ventricular hypertrophy. Left ventricular diastolic parameters were normal. Right Ventricle: The right ventricular size is normal. No increase in right ventricular wall thickness. Right ventricular systolic function is normal. Tricuspid regurgitation signal is inadequate for assessing PA pressure. Left Atrium: Left atrial  size was normal in size. Right Atrium: Right atrial size was normal in size. Pericardium: There is no evidence of pericardial effusion. Mitral Valve: The mitral valve is grossly normal. Trivial mitral valve regurgitation. No evidence of mitral valve stenosis. Tricuspid Valve: The tricuspid valve is grossly normal. Tricuspid valve regurgitation is not demonstrated. No evidence of tricuspid stenosis. Aortic Valve: Focal calium noted on the LCC. The aortic valve is tricuspid. Aortic valve regurgitation is not visualized. No aortic stenosis is present. Pulmonic Valve: The pulmonic valve was grossly normal. Pulmonic valve regurgitation is not visualized. No evidence of pulmonic stenosis. Aorta: The aortic root is normal in size and structure. Venous: The inferior vena cava was not well visualized. IAS/Shunts: The atrial septum is grossly normal.  LEFT VENTRICLE PLAX 2D LVIDd:         4.20 cm   Diastology LVIDs:         3.00 cm   LV e' medial:    8.38 cm/s LV PW:         0.60 cm   LV E/e' medial:  8.8 LV IVS:        0.50 cm   LV e' lateral:   10.90 cm/s LVOT diam:     1.90 cm   LV E/e' lateral: 6.8 LV SV:         48 LV SV Index:   33 LVOT Area:     2.84 cm  RIGHT VENTRICLE RV S prime:     11.80 cm/s TAPSE (M-mode): 1.6 cm LEFT ATRIUM           Index        RIGHT ATRIUM          Index LA diam:      3.30 cm 2.27 cm/m   RA Area:     9.96 cm LA Vol (A2C): 13.2 ml 9.08 ml/m   RA Volume:   16.60 ml 11.42 ml/m LA Vol (A4C): 27.3 ml 18.78 ml/m  AORTIC VALVE LVOT Vmax:   87.70 cm/s LVOT Vmean:  60.200 cm/s LVOT VTI:    0.170 m  AORTA Ao Root diam: 3.20 cm Ao Asc diam:  2.60 cm MITRAL VALVE MV Area (PHT): 4.63 cm    SHUNTS MV Decel Time: 164 msec    Systemic VTI:  0.17 m MR Peak grad: 2.2 mmHg     Systemic Diam: 1.90 cm MR Vmax:      73.70 cm/s MV E velocity: 74.10 cm/s MV A velocity: 83.20 cm/s MV E/A ratio:  0.89 Lennie Odor MD Electronically signed by Lennie Odor MD Signature Date/Time: 08/21/2023/7:35:47 PM    Final     DG Abd Portable 1V  Result Date: 08/21/2023 CLINICAL DATA:  A nasogastric feeding tube is seen with its distal tip overlying the expected region  of the duodenal bulb. EXAM: PORTABLE ABDOMEN - 1 VIEW COMPARISON:  None Available. FINDINGS: The bowel gas pattern is normal. No radio-opaque calculi are seen. Postoperative changes are noted within the thoracolumbar spine. IMPRESSION: Nasogastric feeding tube positioning, as described above. Electronically Signed   By: Aram Candela M.D.   On: 08/21/2023 17:11   DG Chest Portable 1 View  Result Date: 08/20/2023 CLINICAL DATA:  Check line placement. EXAM: PORTABLE CHEST 1 VIEW COMPARISON:  Chest, abdomen and pelvis CT with contrast yesterday. FINDINGS: 1:03 a.m. Interval left IJ line insertion with tip in the distal SVC. The lungs are clear. There is no visible pneumothorax. Heart size and vasculature are normal. The mediastinum is normally outlined. There is no substantial pleural effusion. There is mild chronic elevation of the right diaphragm. There is a fractured lower thoracic/lumbar spinal fusion rod partially visible. Mild thoracic dextroscoliosis. IMPRESSION: Interval left IJ line insertion with tip in the distal SVC. No visible pneumothorax. No evidence of acute chest process. Electronically Signed   By: Almira Bar M.D.   On: 08/20/2023 01:36   CT CHEST ABDOMEN PELVIS W CONTRAST  Result Date: 08/19/2023 CLINICAL DATA:  Sepsis EXAM: CT CHEST, ABDOMEN, AND PELVIS WITH CONTRAST TECHNIQUE: Multidetector CT imaging of the chest, abdomen and pelvis was performed following the standard protocol during bolus administration of intravenous contrast. RADIATION DOSE REDUCTION: This exam was performed according to the departmental dose-optimization program which includes automated exposure control, adjustment of the mA and/or kV according to patient size and/or use of iterative reconstruction technique. CONTRAST:  75mL OMNIPAQUE IOHEXOL 350 MG/ML SOLN  COMPARISON:  None Available. FINDINGS: CT CHEST FINDINGS Cardiovascular: Heart is normal size. Aorta is normal caliber. No filling defects in the pulmonary arteries to suggest pulmonary emboli. Mediastinum/Nodes: No mediastinal, hilar, or axillary adenopathy. Trachea and esophagus are unremarkable. Thyroid unremarkable. Moderate-sized hiatal hernia. Lungs/Pleura: Lungs are clear. No focal airspace opacities or suspicious nodules. No effusions. Musculoskeletal: Chest wall soft tissues are unremarkable. No acute bony abnormality. CT ABDOMEN PELVIS FINDINGS Hepatobiliary: Diffuse low-density throughout the liver compatible with fatty infiltration. No focal abnormality. Gallbladder unremarkable. Pancreas: No focal abnormality or ductal dilatation. Spleen: No focal abnormality.  Normal size. Adrenals/Urinary Tract: Foley catheter in the bladder. No renal or adrenal mass. No stones or hydronephrosis. Stomach/Bowel: Moderate stool burden throughout the colon. Stomach, large and small bowel grossly unremarkable. Vascular/Lymphatic: No evidence of aneurysm or adenopathy. Reproductive: No visible focal abnormality. Other: No free fluid or free air. Musculoskeletal: Posterior spinal rod in the thoracolumbar spine. Scoliosis. No acute bony abnormality. IMPRESSION: No acute findings in the chest, abdomen or pelvis. Moderate stool burden throughout the colon. Fatty liver. Moderate-sized hiatal hernia. Electronically Signed   By: Charlett Nose M.D.   On: 08/19/2023 23:48   CT HEAD CODE STROKE WO CONTRAST  Result Date: 08/19/2023 CLINICAL DATA:  Code stroke.  Neuro deficit, acute, stroke suspected EXAM: CT HEAD WITHOUT CONTRAST TECHNIQUE: Contiguous axial images were obtained from the base of the skull through the vertex without intravenous contrast. RADIATION DOSE REDUCTION: This exam was performed according to the departmental dose-optimization program which includes automated exposure control, adjustment of the mA and/or kV  according to patient size and/or use of iterative reconstruction technique. COMPARISON:  CT head 12/24/2009. FINDINGS: Motion limited study. 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 No clear evidence of acute large vascular territory infarct or acute hemorrhage. No midline shift or hydrocephalus. Cerebral  atrophy. Vascular: No hyperdense vessel. Skull: No acute fracture. Sinuses/Orbits: Clear sinuses.  No acute orbital findings. Other: No mastoid effusions. ASPECTS [facility] Stroke Program Early CT Score) total score (0-10 with 10 being normal): 10. IMPRESSION: 1. Motion limited study with interval, but remote appearing infarct in the right insula and overlying operculum. An MRI could provide more sensitive evaluation for acute infarct if clinically warranted. 2. Remote left frontal infarct. Code stroke imaging results were communicated on 08/19/2023 at 6:51 pm to provider [facility] via telephone, who verbally acknowledged these results. Electronically Signed   By: Feliberto Harts M.D.   On: 08/19/2023 18:52    Microbiology: Results for orders placed or performed during the hospital encounter of 08/19/23  Resp panel by RT-PCR (RSV, Flu A&B, Covid) Anterior Nasal Swab     Status: None   Collection Time: 08/19/23  7:37 PM   Specimen: Anterior Nasal Swab  Result Value Ref Range Status   SARS Coronavirus 2 by RT PCR NEGATIVE NEGATIVE Final    Comment: (NOTE) SARS-CoV-2 target nucleic acids are NOT DETECTED.  The SARS-CoV-2 RNA is generally detectable in upper respiratory specimens during the acute phase of infection. The lowest concentration of SARS-CoV-2 viral copies this assay can detect is 138 copies/mL. A negative result does not preclude SARS-Cov-2 infection and should not be used as the sole basis for treatment or other patient management decisions. A negative result may occur with  improper specimen  collection/handling, submission of specimen other than nasopharyngeal swab, presence of viral mutation(s) within the areas targeted by this assay, and inadequate number of viral copies(<138 copies/mL). A negative result must be combined with clinical observations, patient history, and epidemiological information. The expected result is Negative.  Fact Sheet for Patients:  BloggerCourse.com  Fact Sheet for Healthcare Providers:  SeriousBroker.it  This test is no t yet approved or cleared by the [place] FDA and  has been authorized for detection and/or diagnosis of SARS-CoV-2 by FDA under an Emergency Use Authorization (EUA). This EUA will remain  in effect (meaning this test can be used) for the duration of the COVID-19 declaration under Section 564(b)(1) of the Act, 21 U.S.C.section 360bbb-3(b)(1), unless the authorization is terminated  or revoked sooner.       Influenza A by PCR NEGATIVE NEGATIVE Final   Influenza B by PCR NEGATIVE NEGATIVE Final    Comment: (NOTE) The Xpert Xpress SARS-CoV-2/FLU/RSV plus assay is intended as an aid in the diagnosis of influenza from Nasopharyngeal swab specimens and should not be used as a sole basis for treatment. Nasal washings and aspirates are unacceptable for Xpert Xpress SARS-CoV-2/FLU/RSV testing.  Fact Sheet for Patients: BloggerCourse.com  Fact Sheet for Healthcare Providers: SeriousBroker.it  This test is not yet approved or cleared by the [place] FDA and has been authorized for detection and/or diagnosis of SARS-CoV-2 by FDA under an Emergency Use Authorization (EUA). This EUA will remain in effect (meaning this test can be used) for the duration of the COVID-19 declaration under Section 564(b)(1) of the Act, 21 U.S.C. section 360bbb-3(b)(1), unless the authorization is terminated or revoked.     Resp Syncytial  Virus by PCR NEGATIVE NEGATIVE Final    Comment: (NOTE) Fact Sheet for Patients: BloggerCourse.com  Fact Sheet for Healthcare Providers: SeriousBroker.it  This test is not yet approved or cleared by the [place] FDA and has been authorized for detection and/or diagnosis of SARS-CoV-2 by FDA under an Emergency Use Authorization (EUA). This EUA will remain in effect (meaning  this test can be used) for the duration of the COVID-19 declaration under Section 564(b)(1) of the Act, 21 U.S.C. section 360bbb-3(b)(1), unless the authorization is terminated or revoked.  Performed at [facility], [address], [place], [place] [address]   Blood Culture (routine x 2)     Status: Abnormal   Collection Time: 08/19/23  7:46 PM   Specimen: BLOOD  Result Value Ref Range Status   Specimen Description   Final    BLOOD BLOOD RIGHT HAND Performed at [facility], [address] [place], [place] [address]    Special Requests   Final    BOTTLES DRAWN AEROBIC AND ANAEROBIC Blood Culture adequate volume Performed at [facility], [address], [place], [place] [address]    Culture  Setup Time   Final    AEROBIC BOTTLE ONLY GRAM POSITIVE COCCI Gram Stain Report Called to,Read Back By and Verified With: WHITE, DANIEL ON 08/20/2023 AT 1320 BY FRATTO,ASHLEY CRITICAL RESULT CALLED TO, READ BACK BY AND VERIFIED WITH: PHARMD TWEE  DANG ON 08/20/23 @ 1715 BY DRT    Culture (A)  Final    STAPHYLOCOCCUS AUREUS SUSCEPTIBILITIES PERFORMED ON PREVIOUS CULTURE WITHIN THE LAST 5 DAYS. Performed at [facility], [address], [place], [place] [address]    Report Status 08/22/2023 FINAL  Final  Blood Culture (routine x 2)     Status: Abnormal   Collection Time: 08/19/23  8:05 PM   Specimen: BLOOD RIGHT HAND  Result Value Ref Range Status   Specimen Description   Final    BLOOD RIGHT HAND Performed at [facility], [address],  [place], [place] [address]    Special Requests   Final    BOTTLES DRAWN AEROBIC ONLY Blood Culture results may not be optimal due to an excessive volume of blood received in culture bottles Performed at [facility], [address] [place], [place] [address]    Culture  Setup Time   Final    AEROBIC BOTTLE ONLY GRAM POSITIVE COCCI Gram Stain Report Called to,Read Back By and Verified With: WHITE, DANIEL ON 08/20/2023 AT 1320 BY FRATTO,ASHLEY CRITICAL RESULT CALLED TO, READ BACK BY AND VERIFIED WITH: PHARMD TWEE  DANG ON 08/20/23 @ 1715 BY DRT Performed at [facility], [address], [place], [place] [address]    Culture STAPHYLOCOCCUS AUREUS (A)  Final   Report Status 08/22/2023 FINAL  Final   Organism ID, Bacteria STAPHYLOCOCCUS AUREUS  Final      Susceptibility   Staphylococcus aureus - MIC*    CIPROFLOXACIN <=0.5 SENSITIVE Sensitive     ERYTHROMYCIN <=0.25 SENSITIVE Sensitive     GENTAMICIN <=0.5 SENSITIVE Sensitive     OXACILLIN <=0.25 SENSITIVE Sensitive     TETRACYCLINE <=1 SENSITIVE Sensitive     VANCOMYCIN <=0.5 SENSITIVE Sensitive     TRIMETH/SULFA <=10 SENSITIVE Sensitive     CLINDAMYCIN <=0.25 SENSITIVE Sensitive     RIFAMPIN <=0.5 SENSITIVE Sensitive     Inducible Clindamycin NEGATIVE Sensitive     LINEZOLID 2 SENSITIVE Sensitive     * STAPHYLOCOCCUS AUREUS  Blood Culture ID Panel (Reflexed)     Status: Abnormal   Collection Time: 08/19/23  8:05 PM  Result Value Ref Range Status   Enterococcus faecalis NOT DETECTED NOT DETECTED Final   Enterococcus Faecium NOT DETECTED NOT DETECTED Final   Listeria monocytogenes NOT DETECTED NOT DETECTED Final   Staphylococcus species DETECTED (A) NOT DETECTED Final    Comment:  CRITICAL RESULT CALLED TO, READ BACK BY AND VERIFIED WITH: PHARMD TWEE  DANG ON 08/20/23 @ 1715 BY DRT    Staphylococcus aureus (BCID) DETECTED (A) NOT DETECTED Final    Comment: CRITICAL RESULT CALLED TO, READ BACK BY AND VERIFIED WITH: PHARMD TWEE  DANG  ON 08/20/23 @ 1715 BY DRT    Staphylococcus epidermidis NOT DETECTED NOT DETECTED Final   Staphylococcus lugdunensis NOT DETECTED NOT DETECTED Final   Streptococcus species NOT DETECTED NOT DETECTED Final   Streptococcus agalactiae NOT DETECTED NOT DETECTED Final   Streptococcus pneumoniae NOT DETECTED NOT DETECTED Final   Streptococcus pyogenes NOT DETECTED NOT DETECTED Final   A.calcoaceticus-baumannii NOT DETECTED NOT DETECTED Final   Bacteroides fragilis NOT DETECTED NOT DETECTED Final   Enterobacterales NOT DETECTED NOT DETECTED Final   Enterobacter cloacae complex NOT DETECTED NOT DETECTED Final   Escherichia coli NOT DETECTED NOT DETECTED Final   Klebsiella aerogenes NOT DETECTED NOT DETECTED Final   Klebsiella oxytoca NOT DETECTED NOT DETECTED Final   Klebsiella pneumoniae NOT DETECTED NOT DETECTED Final   Proteus species NOT DETECTED NOT DETECTED Final   Salmonella species NOT DETECTED NOT DETECTED Final   Serratia marcescens NOT DETECTED NOT DETECTED Final   Haemophilus influenzae NOT DETECTED NOT DETECTED Final   Neisseria meningitidis NOT DETECTED NOT DETECTED Final   Pseudomonas aeruginosa NOT DETECTED NOT DETECTED Final   Stenotrophomonas maltophilia NOT DETECTED NOT DETECTED Final   Candida albicans NOT DETECTED NOT DETECTED Final   Candida auris NOT DETECTED NOT DETECTED Final   Candida glabrata NOT DETECTED NOT DETECTED Final   Candida krusei NOT DETECTED NOT DETECTED Final   Candida parapsilosis NOT DETECTED NOT DETECTED Final   Candida tropicalis NOT DETECTED NOT DETECTED Final   Cryptococcus neoformans/gattii NOT DETECTED NOT DETECTED Final   Meth resistant mecA/C and MREJ NOT DETECTED NOT DETECTED Final    Comment: Performed at [facility], [address], [place], [place] [address]  MRSA Next Gen by PCR, Nasal     Status: None   Collection Time: 08/20/23 11:30 AM   Specimen: Nasal Mucosa; Nasal Swab  Result Value Ref Range Status   MRSA by PCR Next Gen  NOT DETECTED NOT DETECTED Final    Comment: (NOTE) The GeneXpert MRSA Assay (FDA approved for NASAL specimens only), is one component of a comprehensive MRSA colonization surveillance program. It is not intended to diagnose MRSA infection nor to guide or monitor treatment for MRSA infections. Test performance is not FDA approved in patients less than 15 years old. Performed at [facility], [address] [place], [place] [address]   Culture, blood (Routine X 2) w Reflex to ID Panel     Status: None (Preliminary result)   Collection Time: 08/21/23  6:44 PM   Specimen: BLOOD RIGHT HAND  Result Value Ref Range Status   Specimen Description BLOOD RIGHT HAND  Final   Special Requests   Final    BOTTLES DRAWN AEROBIC AND ANAEROBIC Blood Culture adequate volume   Culture   Final    NO GROWTH 3 DAYS Performed at [facility], [address] [place], [place] [address]    Report Status PENDING  Incomplete  Culture, blood (Routine X 2) w Reflex to ID Panel     Status: None (Preliminary result)   Collection Time: 08/21/23  6:44 PM   Specimen: BLOOD RIGHT ARM  Result Value Ref Range Status   Specimen Description BLOOD RIGHT ARM  Final   Special Requests   Final    BOTTLES DRAWN AEROBIC AND ANAEROBIC Blood Culture adequate volume   Culture   Final    NO GROWTH 3 DAYS Performed at [facility], [address] [place], [place] [address]    Report Status PENDING  Incomplete   Labs: CBC: Recent Labs  Lab 08/19/23 1833 08/20/23 0857 08/20/23 1332 08/21/23 0607 08/23/23 0410 08/24/23 0557  WBC 13.9* 19.1* 20.1* 19.4* 27.6* 22.5*  NEUTROABS 10.2* 16.1* 16.6*  --  26.2* 19.8*  HGB 4.5* 8.5* 9.1* 8.8* 8.1* 8.3*  HCT 19.9* 28.3* 30.1* 28.9* 27.3* 28.6*  MCV 62.4* 71.6* 70.2* 71.5* 73.8* 75.7*  PLT 526* 434* 450* 390 311 315   Basic Metabolic Panel: Recent Labs  Lab 08/19/23 1833 08/20/23 0909 08/20/23 1332 08/20/23 2101 08/21/23 1030 08/21/23 2129  08/22/23 0610 08/23/23 0410 08/24/23 0557  NA 135   < > 137  --  142  --  134* 132* 132*  K 3.9   < > 2.5* 2.9* 3.3*  --  3.1* 3.4* 3.4*  CL 104   < > 105  --  105  --  103 101 103  CO2 24   < > 24  --  25  --  21* 21* 23  GLUCOSE 92   < > 92  --  85  --  139* 63* 107*  BUN 16   < > 9  --  <5*  --  5* <5* <5*  CREATININE 0.55*   < > 0.46*  --  0.67  --  0.45* 0.35* 0.50*  CALCIUM 7.8*   < > 7.7*  --  8.2*  --  7.5* 7.3* 7.2*  MG 2.0  --   --   --   --  1.4* 1.4* 2.3 1.8  PHOS  --   --   --   --   --  2.1* 1.8* 2.7 2.2*   < > = values in this interval not displayed.   Liver Function Tests: Recent Labs  Lab 08/19/23 1833 08/20/23 0909 08/22/23 0610 08/24/23 0557  AST 40 23 17 19   ALT 30 21 13 9   ALKPHOS 95 75 68 71  BILITOT 0.5 1.3* 0.6 0.5  PROT 8.5* 6.2* 6.0* 5.8*  ALBUMIN 1.8* <1.5* 2.0* 1.7*   CBG: Recent Labs  Lab 08/23/23 1714 08/23/23 2123 08/24/23 0049 08/24/23 0413 08/24/23 0801  GLUCAP 84 99 106* 124* 84   Discharge time spent: greater than 30 minutes.  Signed: Marguerita Merles, DO [facility] 08/24/2023

## 2023-08-25 ENCOUNTER — Encounter (HOSPITAL_COMMUNITY): Payer: Self-pay | Admitting: Gastroenterology

## 2023-08-25 NOTE — Progress Notes (Signed)
Was told by the [facility] team this AM around 9:45 AM Edwin Dada and [employer]) that patient had a bed available at [facility] with [facility] and could be discharged to [facility] and that family was aware of the plan of care of patient going to [facility]. The [facility] team stated that the transport with [facility] was called for pickup by noon, when I hadn't even seen the patient yet or did any paperwork. After I was informed I went to see and examined the patient and I had asked TOC if I needed to talk with the family (the sister Clydie Braun) and they said no need as the plan of care was in place and that she was aware and agreeable to the transfer and that everything was being arranged at [facility] for TEE, Colonoscopy and further workup (as it was still pending at our hospital). Discharge orders were placed and Summary was done patient transferred to LTAC around 1310 as that was the disposition decided per documentation and per [facility] informing me verbally.   Subsequently, Patient's sister came by in the afternoon around 1535 inquiring about where the the patient was and she was informed that he had been transferred to [facility]. She was visibly upset and nurse Elisa messaged and stated that the family wanted to speak with me.  I then called the sister Clydie Braun and talked with her and I told her that the [facility] Case Manager and Social Worker had informed me that the plan of care was to transfer the patient to LTAC based on what they told me and what was discussed with her in prior documentation. She stated no one had called her and that she didn't even accept the bed at [facility]. I sincerely apologized to her and told her that I wouldn't have ever discharged him if I knew that she had never accepted the bed at [facility] and I told her the [facility] team had made me believe that she was aware of the plan of care and that she was agreeable to transferring to [facility]. The sister re-iterated no one had talked to her from our Transitions of Care team and no one  from [facility] had talked with her either. I again apologized and I told her I would get TOC to reach out to her and escalate the situation.  After I got off the phone with the patient's Sister, I immediately messaged TOC and I spoke with Edwin Dada of TOC, and she stated that the [name] from [facility] had talked with the sister and she had agreed to transfer. When I asked her directly about if she had spoken with the sister she had indicated that she had not and stated "[name] from [facility] said she notified her" and that she would call [name]. When I asked her why I was made to believe that everything was in place and that the patient's sister was aware she stated "because the admissions liaison said she had told her" and that she was only reporting what she was told.   I told Okey Regal that the sister had not even accepted the bed at [facility] from my discussion with Clydie Braun, and that she was only sent a link on Friday to review. I questioned how this could happen and Okey Regal stated she would call the sister but I escalated the Situation and asked to speak with Scientist, research (life sciences) and was given Jiles Crocker, RN's number. I spoke with Jiles Crocker and she was made aware of the situation in detail and  added to all the secure chats. Steward Drone agreed that the situation was unacceptable and stated she would investigate what happened and then she reached out to the patient's sister, the [facility] Liaison, and Catering manager of the Transitions of Care and told me she would continue to follow up on this case.   I put a Safety Portal Zone in place about the situation and also notified my Director on call Dr. Shauna Hugh and he was in agreement that needed to be escalated and they will review and and take appropriate measures.

## 2023-08-26 DIAGNOSIS — G808 Other cerebral palsy: Secondary | ICD-10-CM | POA: Diagnosis not present

## 2023-08-26 DIAGNOSIS — E46 Unspecified protein-calorie malnutrition: Secondary | ICD-10-CM | POA: Diagnosis not present

## 2023-08-26 DIAGNOSIS — D649 Anemia, unspecified: Secondary | ICD-10-CM | POA: Diagnosis not present

## 2023-08-26 DIAGNOSIS — R6521 Severe sepsis with septic shock: Secondary | ICD-10-CM | POA: Diagnosis not present

## 2023-08-26 LAB — CULTURE, BLOOD (ROUTINE X 2)
Culture: NO GROWTH
Culture: NO GROWTH
Special Requests: ADEQUATE
Special Requests: ADEQUATE

## 2023-08-27 ENCOUNTER — Telehealth: Payer: Self-pay | Admitting: Cardiology

## 2023-08-27 DIAGNOSIS — G809 Cerebral palsy, unspecified: Secondary | ICD-10-CM | POA: Diagnosis not present

## 2023-08-27 DIAGNOSIS — R0989 Other specified symptoms and signs involving the circulatory and respiratory systems: Secondary | ICD-10-CM | POA: Diagnosis not present

## 2023-08-27 DIAGNOSIS — R000 Tachycardia, unspecified: Secondary | ICD-10-CM | POA: Diagnosis not present

## 2023-08-27 DIAGNOSIS — E43 Unspecified severe protein-calorie malnutrition: Secondary | ICD-10-CM | POA: Diagnosis not present

## 2023-08-27 DIAGNOSIS — R011 Cardiac murmur, unspecified: Secondary | ICD-10-CM | POA: Diagnosis not present

## 2023-08-27 DIAGNOSIS — A419 Sepsis, unspecified organism: Secondary | ICD-10-CM | POA: Diagnosis not present

## 2023-08-27 NOTE — Telephone Encounter (Signed)
On-call cardiology received a page from [phone].  Dr. Petra Kuba (hospitalist/pulmonologist) from [facility] wanted to know if TEE had been done during his recent hospitalization. I reviewed the chart and informed him that based on EMR at [facility] TEE was not completed and per discharge summary recommendations were:   "Recommendations at discharge:    Follow up with PCP within 1-2 weeks and repeat CBC,CMP,Mag,Phos Patient will need a TEE at Facility for further bacteremia workup Patient will need a Colonoscopy at the Facility for further anemia Workup  Patient will need an MRI at the facility prior to discharging given lack of access to care"  He was thankful for answering his page.   Tessa Lerner, [place], [facility]  Pager:  [phone] Office: [phone]

## 2023-08-28 DIAGNOSIS — I5033 Acute on chronic diastolic (congestive) heart failure: Secondary | ICD-10-CM | POA: Diagnosis not present

## 2023-08-28 DIAGNOSIS — G809 Cerebral palsy, unspecified: Secondary | ICD-10-CM | POA: Diagnosis not present

## 2023-08-28 DIAGNOSIS — A419 Sepsis, unspecified organism: Secondary | ICD-10-CM | POA: Diagnosis not present

## 2023-08-28 DIAGNOSIS — R011 Cardiac murmur, unspecified: Secondary | ICD-10-CM | POA: Diagnosis not present

## 2023-08-29 DIAGNOSIS — R7881 Bacteremia: Secondary | ICD-10-CM | POA: Diagnosis not present

## 2023-08-29 DIAGNOSIS — A419 Sepsis, unspecified organism: Secondary | ICD-10-CM | POA: Diagnosis not present

## 2023-08-29 DIAGNOSIS — R6521 Severe sepsis with septic shock: Secondary | ICD-10-CM | POA: Diagnosis not present

## 2023-08-29 DIAGNOSIS — R5381 Other malaise: Secondary | ICD-10-CM | POA: Diagnosis not present

## 2023-08-29 DIAGNOSIS — I5033 Acute on chronic diastolic (congestive) heart failure: Secondary | ICD-10-CM | POA: Diagnosis not present

## 2023-08-29 DIAGNOSIS — G809 Cerebral palsy, unspecified: Secondary | ICD-10-CM | POA: Diagnosis not present

## 2023-08-29 DIAGNOSIS — M6240 Contracture of muscle, unspecified site: Secondary | ICD-10-CM | POA: Diagnosis not present

## 2023-08-29 DIAGNOSIS — G9349 Other encephalopathy: Secondary | ICD-10-CM | POA: Diagnosis not present

## 2023-08-29 DIAGNOSIS — R011 Cardiac murmur, unspecified: Secondary | ICD-10-CM | POA: Diagnosis not present

## 2023-08-30 DIAGNOSIS — E43 Unspecified severe protein-calorie malnutrition: Secondary | ICD-10-CM | POA: Diagnosis not present

## 2023-08-30 DIAGNOSIS — G9349 Other encephalopathy: Secondary | ICD-10-CM | POA: Diagnosis not present

## 2023-08-30 DIAGNOSIS — I5033 Acute on chronic diastolic (congestive) heart failure: Secondary | ICD-10-CM | POA: Diagnosis not present

## 2023-08-30 DIAGNOSIS — R011 Cardiac murmur, unspecified: Secondary | ICD-10-CM | POA: Diagnosis not present

## 2023-08-30 DIAGNOSIS — R5381 Other malaise: Secondary | ICD-10-CM | POA: Diagnosis not present

## 2023-08-30 DIAGNOSIS — R6521 Severe sepsis with septic shock: Secondary | ICD-10-CM | POA: Diagnosis not present

## 2023-08-30 DIAGNOSIS — A419 Sepsis, unspecified organism: Secondary | ICD-10-CM | POA: Diagnosis not present

## 2023-08-30 DIAGNOSIS — M6240 Contracture of muscle, unspecified site: Secondary | ICD-10-CM | POA: Diagnosis not present

## 2023-08-30 DIAGNOSIS — R7881 Bacteremia: Secondary | ICD-10-CM | POA: Diagnosis not present

## 2023-08-31 DIAGNOSIS — R011 Cardiac murmur, unspecified: Secondary | ICD-10-CM | POA: Diagnosis not present

## 2023-08-31 DIAGNOSIS — A419 Sepsis, unspecified organism: Secondary | ICD-10-CM | POA: Diagnosis not present

## 2023-08-31 DIAGNOSIS — I5033 Acute on chronic diastolic (congestive) heart failure: Secondary | ICD-10-CM | POA: Diagnosis not present

## 2023-08-31 DIAGNOSIS — R0989 Other specified symptoms and signs involving the circulatory and respiratory systems: Secondary | ICD-10-CM | POA: Diagnosis not present

## 2023-08-31 DIAGNOSIS — E43 Unspecified severe protein-calorie malnutrition: Secondary | ICD-10-CM | POA: Diagnosis not present

## 2023-09-01 DIAGNOSIS — E43 Unspecified severe protein-calorie malnutrition: Secondary | ICD-10-CM | POA: Diagnosis not present

## 2023-09-01 DIAGNOSIS — R011 Cardiac murmur, unspecified: Secondary | ICD-10-CM | POA: Diagnosis not present

## 2023-09-01 DIAGNOSIS — A419 Sepsis, unspecified organism: Secondary | ICD-10-CM | POA: Diagnosis not present

## 2023-09-01 DIAGNOSIS — I5033 Acute on chronic diastolic (congestive) heart failure: Secondary | ICD-10-CM | POA: Diagnosis not present

## 2023-09-02 DIAGNOSIS — I5033 Acute on chronic diastolic (congestive) heart failure: Secondary | ICD-10-CM | POA: Diagnosis not present

## 2023-09-02 DIAGNOSIS — E43 Unspecified severe protein-calorie malnutrition: Secondary | ICD-10-CM | POA: Diagnosis not present

## 2023-09-02 DIAGNOSIS — A419 Sepsis, unspecified organism: Secondary | ICD-10-CM | POA: Diagnosis not present

## 2023-09-02 DIAGNOSIS — R011 Cardiac murmur, unspecified: Secondary | ICD-10-CM | POA: Diagnosis not present

## 2023-09-03 DIAGNOSIS — G809 Cerebral palsy, unspecified: Secondary | ICD-10-CM | POA: Diagnosis not present

## 2023-09-03 DIAGNOSIS — J811 Chronic pulmonary edema: Secondary | ICD-10-CM | POA: Diagnosis not present

## 2023-09-03 DIAGNOSIS — R011 Cardiac murmur, unspecified: Secondary | ICD-10-CM | POA: Diagnosis not present

## 2023-09-03 DIAGNOSIS — A419 Sepsis, unspecified organism: Secondary | ICD-10-CM | POA: Diagnosis not present

## 2023-09-04 DIAGNOSIS — R011 Cardiac murmur, unspecified: Secondary | ICD-10-CM | POA: Diagnosis not present

## 2023-09-04 DIAGNOSIS — I5033 Acute on chronic diastolic (congestive) heart failure: Secondary | ICD-10-CM | POA: Diagnosis not present

## 2023-09-04 DIAGNOSIS — A419 Sepsis, unspecified organism: Secondary | ICD-10-CM | POA: Diagnosis not present

## 2023-09-04 DIAGNOSIS — G809 Cerebral palsy, unspecified: Secondary | ICD-10-CM | POA: Diagnosis not present

## 2023-09-05 DIAGNOSIS — M6240 Contracture of muscle, unspecified site: Secondary | ICD-10-CM | POA: Diagnosis not present

## 2023-09-05 DIAGNOSIS — R5381 Other malaise: Secondary | ICD-10-CM | POA: Diagnosis not present

## 2023-09-05 DIAGNOSIS — R918 Other nonspecific abnormal finding of lung field: Secondary | ICD-10-CM | POA: Diagnosis not present

## 2023-09-05 DIAGNOSIS — J811 Chronic pulmonary edema: Secondary | ICD-10-CM | POA: Diagnosis not present

## 2023-09-05 DIAGNOSIS — R011 Cardiac murmur, unspecified: Secondary | ICD-10-CM | POA: Diagnosis not present

## 2023-09-05 DIAGNOSIS — R7881 Bacteremia: Secondary | ICD-10-CM | POA: Diagnosis not present

## 2023-09-05 DIAGNOSIS — G9349 Other encephalopathy: Secondary | ICD-10-CM | POA: Diagnosis not present

## 2023-09-05 DIAGNOSIS — G809 Cerebral palsy, unspecified: Secondary | ICD-10-CM | POA: Diagnosis not present

## 2023-09-05 DIAGNOSIS — A419 Sepsis, unspecified organism: Secondary | ICD-10-CM | POA: Diagnosis not present

## 2023-09-05 DIAGNOSIS — R188 Other ascites: Secondary | ICD-10-CM | POA: Diagnosis not present

## 2023-09-05 DIAGNOSIS — R6521 Severe sepsis with septic shock: Secondary | ICD-10-CM | POA: Diagnosis not present

## 2023-09-06 DIAGNOSIS — G809 Cerebral palsy, unspecified: Secondary | ICD-10-CM | POA: Diagnosis not present

## 2023-09-06 DIAGNOSIS — A419 Sepsis, unspecified organism: Secondary | ICD-10-CM | POA: Diagnosis not present

## 2023-09-06 DIAGNOSIS — I5033 Acute on chronic diastolic (congestive) heart failure: Secondary | ICD-10-CM | POA: Diagnosis not present

## 2023-09-06 DIAGNOSIS — R011 Cardiac murmur, unspecified: Secondary | ICD-10-CM | POA: Diagnosis not present

## 2023-09-09 DIAGNOSIS — A419 Sepsis, unspecified organism: Secondary | ICD-10-CM | POA: Diagnosis not present

## 2023-09-09 DIAGNOSIS — R011 Cardiac murmur, unspecified: Secondary | ICD-10-CM | POA: Diagnosis not present

## 2023-09-09 DIAGNOSIS — I5033 Acute on chronic diastolic (congestive) heart failure: Secondary | ICD-10-CM | POA: Diagnosis not present

## 2023-09-09 DIAGNOSIS — G809 Cerebral palsy, unspecified: Secondary | ICD-10-CM | POA: Diagnosis not present

## 2023-09-10 DIAGNOSIS — J189 Pneumonia, unspecified organism: Secondary | ICD-10-CM | POA: Diagnosis not present

## 2023-09-10 DIAGNOSIS — I5033 Acute on chronic diastolic (congestive) heart failure: Secondary | ICD-10-CM | POA: Diagnosis not present

## 2023-09-10 DIAGNOSIS — G809 Cerebral palsy, unspecified: Secondary | ICD-10-CM | POA: Diagnosis not present

## 2023-09-10 DIAGNOSIS — A419 Sepsis, unspecified organism: Secondary | ICD-10-CM | POA: Diagnosis not present

## 2023-09-11 DIAGNOSIS — I5033 Acute on chronic diastolic (congestive) heart failure: Secondary | ICD-10-CM | POA: Diagnosis not present

## 2023-09-11 DIAGNOSIS — A419 Sepsis, unspecified organism: Secondary | ICD-10-CM | POA: Diagnosis not present

## 2023-09-11 DIAGNOSIS — G809 Cerebral palsy, unspecified: Secondary | ICD-10-CM | POA: Diagnosis not present

## 2023-09-11 DIAGNOSIS — J189 Pneumonia, unspecified organism: Secondary | ICD-10-CM | POA: Diagnosis not present

## 2023-09-12 ENCOUNTER — Encounter (HOSPITAL_COMMUNITY): Payer: Self-pay | Admitting: Cardiovascular Disease

## 2023-09-12 ENCOUNTER — Ambulatory Visit (HOSPITAL_COMMUNITY): Payer: Medicare Other | Admitting: Anesthesiology

## 2023-09-12 ENCOUNTER — Ambulatory Visit (HOSPITAL_COMMUNITY)
Admission: RE | Admit: 2023-09-12 | Discharge: 2023-09-12 | Disposition: A | Discharge status: Home / Self Care | Payer: Medicare Other | Source: Ambulatory Visit | Attending: Cardiovascular Disease | Admitting: Cardiovascular Disease

## 2023-09-12 ENCOUNTER — Ambulatory Visit (HOSPITAL_COMMUNITY)
Admission: RE | Admit: 2023-09-12 | Discharge: 2023-09-12 | Disposition: A | Discharge status: Home / Self Care | Payer: Medicare Other | Attending: Cardiovascular Disease | Admitting: Cardiovascular Disease

## 2023-09-12 ENCOUNTER — Encounter (HOSPITAL_COMMUNITY)
Admission: RE | Disposition: A | Discharge status: Home / Self Care | Payer: Self-pay | Source: Home / Self Care | Attending: Cardiovascular Disease

## 2023-09-12 DIAGNOSIS — R5381 Other malaise: Secondary | ICD-10-CM | POA: Diagnosis not present

## 2023-09-12 DIAGNOSIS — I679 Cerebrovascular disease, unspecified: Secondary | ICD-10-CM

## 2023-09-12 DIAGNOSIS — R7881 Bacteremia: Secondary | ICD-10-CM | POA: Insufficient documentation

## 2023-09-12 DIAGNOSIS — Z87891 Personal history of nicotine dependence: Secondary | ICD-10-CM | POA: Diagnosis not present

## 2023-09-12 DIAGNOSIS — I330 Acute and subacute infective endocarditis: Secondary | ICD-10-CM | POA: Insufficient documentation

## 2023-09-12 DIAGNOSIS — G9340 Encephalopathy, unspecified: Secondary | ICD-10-CM | POA: Insufficient documentation

## 2023-09-12 DIAGNOSIS — G809 Cerebral palsy, unspecified: Secondary | ICD-10-CM | POA: Insufficient documentation

## 2023-09-12 DIAGNOSIS — J189 Pneumonia, unspecified organism: Secondary | ICD-10-CM | POA: Diagnosis not present

## 2023-09-12 DIAGNOSIS — I5033 Acute on chronic diastolic (congestive) heart failure: Secondary | ICD-10-CM | POA: Diagnosis not present

## 2023-09-12 DIAGNOSIS — L89159 Pressure ulcer of sacral region, unspecified stage: Secondary | ICD-10-CM | POA: Insufficient documentation

## 2023-09-12 DIAGNOSIS — R532 Functional quadriplegia: Secondary | ICD-10-CM | POA: Insufficient documentation

## 2023-09-12 DIAGNOSIS — Z8711 Personal history of peptic ulcer disease: Secondary | ICD-10-CM | POA: Diagnosis not present

## 2023-09-12 DIAGNOSIS — M6240 Contracture of muscle, unspecified site: Secondary | ICD-10-CM | POA: Diagnosis not present

## 2023-09-12 DIAGNOSIS — G9349 Other encephalopathy: Secondary | ICD-10-CM | POA: Diagnosis not present

## 2023-09-12 DIAGNOSIS — I700 Atherosclerosis of aorta: Secondary | ICD-10-CM | POA: Diagnosis not present

## 2023-09-12 DIAGNOSIS — R6521 Severe sepsis with septic shock: Secondary | ICD-10-CM | POA: Diagnosis not present

## 2023-09-12 DIAGNOSIS — F418 Other specified anxiety disorders: Secondary | ICD-10-CM

## 2023-09-12 DIAGNOSIS — R579 Shock, unspecified: Secondary | ICD-10-CM

## 2023-09-12 DIAGNOSIS — A419 Sepsis, unspecified organism: Secondary | ICD-10-CM | POA: Diagnosis not present

## 2023-09-12 DIAGNOSIS — Q2112 Patent foramen ovale: Secondary | ICD-10-CM | POA: Insufficient documentation

## 2023-09-12 DIAGNOSIS — R570 Cardiogenic shock: Secondary | ICD-10-CM | POA: Diagnosis present

## 2023-09-12 HISTORY — PX: TEE WITHOUT CARDIOVERSION: SHX5443

## 2023-09-12 LAB — ECHO TEE

## 2023-09-12 SURGERY — ECHOCARDIOGRAM, TRANSESOPHAGEAL
Anesthesia: Monitor Anesthesia Care

## 2023-09-12 MED ORDER — SODIUM CHLORIDE 0.9 % IV SOLN: INTRAVENOUS | Status: DC

## 2023-09-12 MED ORDER — PROPOFOL 10 MG/ML IV BOLUS
INTRAVENOUS | Status: DC | PRN
  Administered 2023-09-12 (×3): 20 mg via INTRAVENOUS
  Administered 2023-09-12: 50 mg via INTRAVENOUS
  Administered 2023-09-12: 20 mg via INTRAVENOUS

## 2023-09-12 MED ORDER — LIDOCAINE 2% (20 MG/ML) 5 ML SYRINGE
INTRAMUSCULAR | Status: DC | PRN
  Administered 2023-09-12: 40 mg via INTRAVENOUS
  Administered 2023-09-12: 20 mg via INTRAVENOUS

## 2023-09-12 MED ORDER — PHENYLEPHRINE 80 MCG/ML (10ML) SYRINGE FOR IV PUSH (FOR BLOOD PRESSURE SUPPORT)
PREFILLED_SYRINGE | INTRAVENOUS | Status: DC | PRN
  Administered 2023-09-12: 120 ug via INTRAVENOUS
  Administered 2023-09-12: 80 ug via INTRAVENOUS

## 2023-09-12 NOTE — H&P (Signed)
Referring Physician: Gregary Signs, MD  Ian Hurley is an 57 y.o. male.                       Chief Complaint: MSSA Bacteremia  HPI: 57 years old male with multiple medical condition includingcerebral palsy, functional quadriplegia and encephalopathy has MSSA bactermia. He is on IV antibiotics per primary care at [facility]. He is here for TEE to r/o endocarditis.  Past Medical History:  Diagnosis Date   Anemia    Anxiety    Atrophic gastritis 04/30/2014   HB 6.6 MCV 63 FERRITIN 6   Cerebral palsy (HCC)    Depression    Duodenitis 04/30/2014   HB 6.6 MCV 63 FERRITIN 6   Erosive esophagitis 04/30/2014   HB 6.6 MCV 63 FERRITIN 6   Polycythemia 05/17/2015   Seizures (HCC)       Past Surgical History:  Procedure Laterality Date   COLONOSCOPY N/A 05/24/2014   XLK:GMWNUU rectal polyp otherwise normal   ESOPHAGOGASTRODUODENOSCOPY N/A 05/23/2014   VOZ:DGUYQ HH/mild non-erosive gastritis/small gastric polyp   ESOPHAGOGASTRODUODENOSCOPY (EGD) WITH PROPOFOL N/A 08/23/2023   Procedure: ESOPHAGOGASTRODUODENOSCOPY (EGD) WITH PROPOFOL;  Surgeon: Sherrilyn Rist, MD;  Location: [facility] ENDOSCOPY;  Service: Gastroenterology;  Laterality: N/A;   FLEXIBLE SIGMOIDOSCOPY N/A 05/23/2014   SLF: formed stool in colon and large amount of liquid stool    No family history on file. Social History:  reports that he quit smoking about 9 years ago. His smoking use included cigarettes. He has never used smokeless tobacco. He reports that he does not drink alcohol and does not use drugs.  Allergies: No Known Allergies  Medications Prior to Admission  Medication Sig Dispense Refill   amiodarone (PACERONE) 200 MG tablet Place 200 mg into feeding tube daily.     aspirin EC 81 MG tablet Take 81 mg by mouth daily. PER TUBE     calcium carbonate (OS-CAL - DOSED IN MG OF ELEMENTAL CALCIUM) 1250 (500 Ca) MG tablet Take 1 tablet by mouth every 12 (twelve) hours.     ceFAZolin (ANCEF) 2-4 GM/100ML-%  IVPB Inject 100 mLs (2 g total) into the vein every 8 (eight) hours.     cyclobenzaprine (FLEXERIL) 5 MG tablet Place 5 mg into feeding tube every 8 (eight) hours.     digoxin (LANOXIN) 0.25 MG tablet Place 0.25 mg into feeding tube daily.     docusate sodium (COLACE) 100 MG capsule Take 100 mg by mouth daily as needed (constipation.).     feeding supplement (ENSURE ENLIVE / ENSURE PLUS) LIQD Take 237 mLs by mouth 3 (three) times daily between meals. (Patient taking differently: Take 118 mLs by mouth in the morning and at bedtime. MAGIC CUP)     ferrous sulfate 300 (60 Fe) MG/5ML syrup Place 300 mg into feeding tube daily.     fluconazole (DIFLUCAN) 200 MG tablet Place 200 mg into feeding tube daily.     insulin lispro (HUMALOG) 100 UNIT/ML injection Inject 0-6 Units into the skin 4 (four) times daily -  before meals and at bedtime.     LORazepam (ATIVAN) 2 MG/ML injection Inject 1 mL (2 mg total) into the vein every 4 (four) hours as needed for seizure.     Multiple Vitamins-Minerals (CENTRUM SILVER 50+MEN) TABS Take 1 tablet by mouth daily.     nutrition supplement, JUVEN, (JUVEN) PACK Place 1 packet into feeding tube 2 (two) times daily between meals.  Nutritional Supplements (FEEDING SUPPLEMENT, OSMOLITE 1.2 CAL,) LIQD Place 1,000 mLs into feeding tube continuous. (Patient taking differently: Place 50 mL/hr into feeding tube continuous.)     oxyCODONE-acetaminophen (PERCOCET/ROXICET) 5-325 MG tablet Place 1 tablet into feeding tube every 6 (six) hours as needed (PAIN).     pantoprazole (PROTONIX) 40 MG tablet Take 1 tablet (40 mg total) by mouth 2 (two) times daily before a meal. 60 tablet 3   polyethylene glycol (MIRALAX / GLYCOLAX) 17 g packet Take 17 g by mouth daily as needed for moderate constipation.     potassium & sodium phosphates (PHOS-NAK) 280-160-250 MG PACK Take 1 packet by mouth 4 (four) times daily -  with meals and at bedtime. (Patient taking differently: Take 1 packet by mouth  every 6 (six) hours.)     Skin Protectants, Misc. (EUCERIN) cream Apply 1 Application topically every 12 (twelve) hours.     sodium chloride 0.9 % infusion Inject 50 mL/hr into the vein continuous.     thiamine (VITAMIN B-1) 100 MG tablet Take 1 tablet (100 mg total) by mouth daily.     traMADol HCl 25 MG TABS Place 25 mg into feeding tube every 12 (twelve) hours.     vancomycin IVPB Inject 1,000 mg into the vein every 12 (twelve) hours.     acetaminophen (TYLENOL) 325 MG tablet Place 650 mg into feeding tube every 6 (six) hours as needed (pain).     Amino Acids-Protein Hydrolys (FEEDING SUPPLEMENT, PRO-STAT 64,) LIQD Place 30 mLs into feeding tube daily.     Chlorhexidine Gluconate Cloth 2 % PADS Apply 6 each topically daily. (Patient not taking: Reported on 09/11/2023)      No results found for this or any previous visit (from the past 48 hour(s)). No results found.  Review Of Systems As per HPI.  Blood pressure 108/61, pulse (!) 107, temperature 98.9 F (37.2 C), temperature source Temporal, resp. rate 18, SpO2 98%. There is no height or weight on file to calculate BMI. General appearance: awake, cooperative, appears stated age and no distress Head: Normocephalic, atraumatic. Eyes: Brown eyes, Pale pink conjunctiva, corneas clear.  Neck: No adenopathy, no carotid bruit, no JVD, supple, symmetrical, trachea midline and thyroid not enlarged. Resp: Clear to auscultation bilaterally. Cardio: Regular rate and rhythm, S1, S2 normal, II/VI systolic murmur, no click, rub or gallop GI: Soft, non-tender; bowel sounds normal; no organomegaly. Extremities: No edema, cyanosis or clubbing. Severe contracture of extremities. Skin: Warm and dry. Multiple stage decubitus ulcers. Neurologic: Alert and oriented X 0.  Assessment/Plan [id number] infection Cerebral palsy Quadriplegia Encephalopathy Sacral decubiti ulcers  Time spent: Review of old records, Lab, x-rays, EKG, other cardiac tests,  examination, discussion with patient/Nurse/Family over 70 minutes.  Ricki Rodriguez, MD  09/12/2023, 9:20 AM

## 2023-09-12 NOTE — Anesthesia Postprocedure Evaluation (Signed)
Anesthesia Post Note  Patient: Ian Hurley  Procedure(s) Performed: TRANSESOPHAGEAL ECHOCARDIOGRAM WITH BUBBLE STUDY     Patient location during evaluation: PACU Anesthesia Type: MAC Level of consciousness: awake and alert Pain management: pain level controlled Vital Signs Assessment: post-procedure vital signs reviewed and stable Respiratory status: spontaneous breathing, nonlabored ventilation, respiratory function stable and patient connected to nasal cannula oxygen Cardiovascular status: stable and blood pressure returned to baseline Postop Assessment: no apparent nausea or vomiting Anesthetic complications: no   No notable events documented.  Last Vitals:  Vitals:   09/12/23 1120 09/12/23 1130  BP: (!) 92/51 (!) 96/56  Pulse: 86 93  Resp: 19 19  Temp:    SpO2: 100% 100%    Last Pain:  Vitals:   09/12/23 1118  TempSrc: Temporal  PainSc: Asleep                 Mariann Barter

## 2023-09-12 NOTE — CV Procedure (Signed)
INDICATIONS:   The patient is 57 years old male with MSSA bacteremia.Marland Kitchen  PROCEDURE:  Informed consent was discussed including risks, benefits and alternatives for the procedure.  Risks include, but are not limited to, cough, sore throat, vomiting, nausea, somnolence, esophageal and stomach trauma or perforation, bleeding, low blood pressure, aspiration, pneumonia, infection, trauma to the teeth and death.    Patient was given propofol sedation.  The oropharynx was anesthetized with topical lidocaine.  The transesophageal probe was inserted in the esophagus and stomach and multiple views were obtained.  Agitated saline was used after the transesophageal probe was removed from the body.  The patient was kept under observation until the patient left the procedure room.  The patient left the procedure room in stable condition.   COMPLICATIONS:  There were no immediate complications.  FINDINGS:  LEFT VENTRICLE: The left ventricle is normal in structure and function.  Wall motion is normal.  No thrombus or masses seen in the left ventricle.  RIGHT VENTRICLE:  The right ventricle is normal in structure and function without any thrombus or masses. RV outflow tract has mobile structure suggestive of vegetation   LEFT ATRIUM:  The left atrium is normal in structure and function without any thrombus or masses.  LEFT ATRIAL APPENDAGE:  The left atrial appendage is small and free of any thrombus or masses.  RIGHT ATRIUM:  The right atrium is free of any thrombus or masses.    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 No ASD.  MITRAL VALVE:  The mitral valve is normal in structure and function with small central jet of regurgitation, no masses, stenosis or vegetations.  TRICUSPID VALVE:  The tricuspid valve is normal in structure and function without regurgitation, masses, stenosis or vegetations.  AORTIC VALVE:  The aortic valve is normal in structure and function with very small  vegetation on left coronary cusp and non-coronary cusp without regurgitation or stenosis.  PULMONIC VALVE:  The pulmonic valve is normal in structure and function without regurgitation, masses, stenosis or vegetations.  AORTIC ARCH, ASCENDING AND DESCENDING AORTA:  The aorta had no atherosclerosis in the ascending or descending aorta.  The aortic arch was normal.   Superior Vena Cava : No thrombus or catheter.   Pulmonary Veins: Visible.   Pulmonary artery: visible and normal.   IMPRESSION:   Normal LV systolic function. Minimal MR. PFO by bubble study. Very small vegetations on AV cusps Small possible vegetation on RV outflow tract.  RECOMMENDATIONS:    Continue IV antibiotics for 6 weeks. Repeat blood culture for negative result post antibiotic treatment.   Family and Dr. Philis Kendall notified

## 2023-09-12 NOTE — Transfer of Care (Signed)
Immediate Anesthesia Transfer of Care Note  Patient: Ian Hurley  Procedure(s) Performed: TRANSESOPHAGEAL ECHOCARDIOGRAM WITH BUBBLE STUDY  Patient Location: PACU  Anesthesia Type:MAC  Level of Consciousness: sedated and drowsy  Airway & Oxygen Therapy: Patient Spontanous Breathing and Patient connected to face mask oxygen  Post-op Assessment: Report given to RN and Post -op Vital signs reviewed and stable  Post vital signs: Reviewed and stable  Last Vitals:  Vitals Value Taken Time  BP 106/72   Temp 97.4   Pulse 82   Resp 14   SpO2 98 %     Last Pain:  Vitals:   09/12/23 0856  TempSrc: Temporal         Complications: No notable events documented.

## 2023-09-12 NOTE — Anesthesia Preprocedure Evaluation (Signed)
Anesthesia Evaluation  Patient identified by MRN, date of birth, ID band Patient awake    Reviewed: Allergy & Precautions, NPO status , Patient's Chart, lab work & pertinent test results, reviewed documented beta blocker date and time   History of Anesthesia Complications Negative for: history of anesthetic complications  Airway Mallampati: III   Neck ROM: Limited    Dental  (+) Poor Dentition   Pulmonary neg COPD, former smoker    + decreased breath sounds      Cardiovascular Exercise Tolerance: Poor (-) CAD, (-) Past MI, (-) Cardiac Stents and (-) CABG  Rhythm:Regular Rate:Tachycardia     Neuro/Psych Seizures -,  PSYCHIATRIC DISORDERS Anxiety Depression    Cerebral palsy CVA    GI/Hepatic PUD,,,  Endo/Other    Renal/GU      Musculoskeletal   Abdominal   Peds  Hematology  (+) Blood dyscrasia, anemia   Anesthesia Other Findings   Reproductive/Obstetrics                              Anesthesia Physical Anesthesia Plan  ASA: 3  Anesthesia Plan: MAC   Post-op Pain Management:    Induction:   PONV Risk Score and Plan: 1 and Ondansetron  Airway Management Planned:   Additional Equipment:   Intra-op Plan:   Post-operative Plan:   Informed Consent: I have reviewed the patients History and Physical, chart, labs and discussed the procedure including the risks, benefits and alternatives for the proposed anesthesia with the patient or authorized representative who has indicated his/her understanding and acceptance.     Dental advisory given  Plan Discussed with: CRNA  Anesthesia Plan Comments:          Anesthesia Quick Evaluation

## 2023-09-12 NOTE — Progress Notes (Signed)
TEE Preliminary: Good LV function Positive bubble study for PFO. Small vegetation on AV, non-coronary and left coronary cusps and RV outflow tract area. Sister and Dr. Philis Kendall notified. Favor 6 weeks of IV antibiotics followed by negative blood cultures.  Orpah Cobb, MD 11:25 AM

## 2023-09-12 NOTE — Progress Notes (Signed)
Pt transferred back to [facility] via [facility]. Report given to EMS staff as well as Morrie Sheldon, RN at [facility].

## 2023-09-13 ENCOUNTER — Encounter (HOSPITAL_COMMUNITY): Payer: Self-pay | Admitting: Cardiovascular Disease

## 2023-09-13 DIAGNOSIS — J189 Pneumonia, unspecified organism: Secondary | ICD-10-CM | POA: Diagnosis not present

## 2023-09-13 DIAGNOSIS — G809 Cerebral palsy, unspecified: Secondary | ICD-10-CM | POA: Diagnosis not present

## 2023-09-13 DIAGNOSIS — I5033 Acute on chronic diastolic (congestive) heart failure: Secondary | ICD-10-CM | POA: Diagnosis not present

## 2023-09-13 DIAGNOSIS — A419 Sepsis, unspecified organism: Secondary | ICD-10-CM | POA: Diagnosis not present

## 2023-09-14 DIAGNOSIS — L89159 Pressure ulcer of sacral region, unspecified stage: Secondary | ICD-10-CM | POA: Diagnosis not present

## 2023-09-14 DIAGNOSIS — G809 Cerebral palsy, unspecified: Secondary | ICD-10-CM | POA: Diagnosis not present

## 2023-09-14 DIAGNOSIS — B9562 Methicillin resistant Staphylococcus aureus infection as the cause of diseases classified elsewhere: Secondary | ICD-10-CM | POA: Diagnosis not present

## 2023-09-15 DIAGNOSIS — B9562 Methicillin resistant Staphylococcus aureus infection as the cause of diseases classified elsewhere: Secondary | ICD-10-CM | POA: Diagnosis not present

## 2023-09-15 DIAGNOSIS — L89159 Pressure ulcer of sacral region, unspecified stage: Secondary | ICD-10-CM | POA: Diagnosis not present

## 2023-09-15 DIAGNOSIS — G809 Cerebral palsy, unspecified: Secondary | ICD-10-CM | POA: Diagnosis not present

## 2023-09-15 DIAGNOSIS — R0989 Other specified symptoms and signs involving the circulatory and respiratory systems: Secondary | ICD-10-CM | POA: Diagnosis not present

## 2023-09-16 DIAGNOSIS — J189 Pneumonia, unspecified organism: Secondary | ICD-10-CM | POA: Diagnosis not present

## 2023-09-16 DIAGNOSIS — R5381 Other malaise: Secondary | ICD-10-CM | POA: Diagnosis not present

## 2023-09-16 DIAGNOSIS — R7881 Bacteremia: Secondary | ICD-10-CM | POA: Diagnosis not present

## 2023-09-16 DIAGNOSIS — I38 Endocarditis, valve unspecified: Secondary | ICD-10-CM | POA: Diagnosis not present

## 2023-09-16 DIAGNOSIS — M6240 Contracture of muscle, unspecified site: Secondary | ICD-10-CM | POA: Diagnosis not present

## 2023-09-16 DIAGNOSIS — I5033 Acute on chronic diastolic (congestive) heart failure: Secondary | ICD-10-CM | POA: Diagnosis not present

## 2023-09-16 DIAGNOSIS — R6521 Severe sepsis with septic shock: Secondary | ICD-10-CM | POA: Diagnosis not present

## 2023-09-16 DIAGNOSIS — A419 Sepsis, unspecified organism: Secondary | ICD-10-CM | POA: Diagnosis not present

## 2023-09-16 DIAGNOSIS — G9349 Other encephalopathy: Secondary | ICD-10-CM | POA: Diagnosis not present

## 2023-09-17 DIAGNOSIS — A419 Sepsis, unspecified organism: Secondary | ICD-10-CM | POA: Diagnosis not present

## 2023-09-17 DIAGNOSIS — M6240 Contracture of muscle, unspecified site: Secondary | ICD-10-CM | POA: Diagnosis not present

## 2023-09-17 DIAGNOSIS — I5033 Acute on chronic diastolic (congestive) heart failure: Secondary | ICD-10-CM | POA: Diagnosis not present

## 2023-09-17 DIAGNOSIS — R7881 Bacteremia: Secondary | ICD-10-CM | POA: Diagnosis not present

## 2023-09-17 DIAGNOSIS — I38 Endocarditis, valve unspecified: Secondary | ICD-10-CM | POA: Diagnosis not present

## 2023-09-17 DIAGNOSIS — R6521 Severe sepsis with septic shock: Secondary | ICD-10-CM | POA: Diagnosis not present

## 2023-09-17 DIAGNOSIS — G9349 Other encephalopathy: Secondary | ICD-10-CM | POA: Diagnosis not present

## 2023-09-17 DIAGNOSIS — R5381 Other malaise: Secondary | ICD-10-CM | POA: Diagnosis not present

## 2023-09-17 DIAGNOSIS — J189 Pneumonia, unspecified organism: Secondary | ICD-10-CM | POA: Diagnosis not present

## 2023-09-18 DIAGNOSIS — I38 Endocarditis, valve unspecified: Secondary | ICD-10-CM | POA: Diagnosis not present

## 2023-09-18 DIAGNOSIS — A419 Sepsis, unspecified organism: Secondary | ICD-10-CM | POA: Diagnosis not present

## 2023-09-18 DIAGNOSIS — I5033 Acute on chronic diastolic (congestive) heart failure: Secondary | ICD-10-CM | POA: Diagnosis not present

## 2023-09-18 DIAGNOSIS — J189 Pneumonia, unspecified organism: Secondary | ICD-10-CM | POA: Diagnosis not present

## 2023-09-19 DIAGNOSIS — G809 Cerebral palsy, unspecified: Secondary | ICD-10-CM | POA: Diagnosis not present

## 2023-09-19 DIAGNOSIS — I5033 Acute on chronic diastolic (congestive) heart failure: Secondary | ICD-10-CM | POA: Diagnosis not present

## 2023-09-19 DIAGNOSIS — R0989 Other specified symptoms and signs involving the circulatory and respiratory systems: Secondary | ICD-10-CM | POA: Diagnosis not present

## 2023-09-19 DIAGNOSIS — I339 Acute and subacute endocarditis, unspecified: Secondary | ICD-10-CM | POA: Diagnosis not present

## 2023-09-19 DIAGNOSIS — A419 Sepsis, unspecified organism: Secondary | ICD-10-CM | POA: Diagnosis not present

## 2023-09-19 DIAGNOSIS — R6521 Severe sepsis with septic shock: Secondary | ICD-10-CM | POA: Diagnosis not present

## 2023-09-19 DIAGNOSIS — M6240 Contracture of muscle, unspecified site: Secondary | ICD-10-CM | POA: Diagnosis not present

## 2023-09-19 DIAGNOSIS — R7881 Bacteremia: Secondary | ICD-10-CM | POA: Diagnosis not present

## 2023-09-19 DIAGNOSIS — G9349 Other encephalopathy: Secondary | ICD-10-CM | POA: Diagnosis not present

## 2023-09-19 DIAGNOSIS — R5381 Other malaise: Secondary | ICD-10-CM | POA: Diagnosis not present

## 2023-09-20 DIAGNOSIS — I5033 Acute on chronic diastolic (congestive) heart failure: Secondary | ICD-10-CM | POA: Diagnosis not present

## 2023-09-20 DIAGNOSIS — A419 Sepsis, unspecified organism: Secondary | ICD-10-CM | POA: Diagnosis not present

## 2023-09-20 DIAGNOSIS — I339 Acute and subacute endocarditis, unspecified: Secondary | ICD-10-CM | POA: Diagnosis not present

## 2023-09-20 DIAGNOSIS — G809 Cerebral palsy, unspecified: Secondary | ICD-10-CM | POA: Diagnosis not present

## 2023-09-21 DIAGNOSIS — I339 Acute and subacute endocarditis, unspecified: Secondary | ICD-10-CM | POA: Diagnosis not present

## 2023-09-21 DIAGNOSIS — A419 Sepsis, unspecified organism: Secondary | ICD-10-CM | POA: Diagnosis not present

## 2023-09-21 DIAGNOSIS — L89159 Pressure ulcer of sacral region, unspecified stage: Secondary | ICD-10-CM | POA: Diagnosis not present

## 2023-09-21 DIAGNOSIS — I5033 Acute on chronic diastolic (congestive) heart failure: Secondary | ICD-10-CM | POA: Diagnosis not present

## 2023-09-22 DIAGNOSIS — R0989 Other specified symptoms and signs involving the circulatory and respiratory systems: Secondary | ICD-10-CM | POA: Diagnosis not present

## 2023-09-22 DIAGNOSIS — L89159 Pressure ulcer of sacral region, unspecified stage: Secondary | ICD-10-CM | POA: Diagnosis not present

## 2023-09-22 DIAGNOSIS — I5033 Acute on chronic diastolic (congestive) heart failure: Secondary | ICD-10-CM | POA: Diagnosis not present

## 2023-09-22 DIAGNOSIS — A419 Sepsis, unspecified organism: Secondary | ICD-10-CM | POA: Diagnosis not present

## 2023-09-22 DIAGNOSIS — I339 Acute and subacute endocarditis, unspecified: Secondary | ICD-10-CM | POA: Diagnosis not present

## 2023-09-23 DIAGNOSIS — I5033 Acute on chronic diastolic (congestive) heart failure: Secondary | ICD-10-CM | POA: Diagnosis not present

## 2023-09-23 DIAGNOSIS — A419 Sepsis, unspecified organism: Secondary | ICD-10-CM | POA: Diagnosis not present

## 2023-09-23 DIAGNOSIS — I339 Acute and subacute endocarditis, unspecified: Secondary | ICD-10-CM | POA: Diagnosis not present

## 2023-09-23 DIAGNOSIS — G809 Cerebral palsy, unspecified: Secondary | ICD-10-CM | POA: Diagnosis not present

## 2023-09-24 DIAGNOSIS — R109 Unspecified abdominal pain: Secondary | ICD-10-CM | POA: Diagnosis not present

## 2023-09-24 DIAGNOSIS — I5033 Acute on chronic diastolic (congestive) heart failure: Secondary | ICD-10-CM | POA: Diagnosis not present

## 2023-09-24 DIAGNOSIS — R6521 Severe sepsis with septic shock: Secondary | ICD-10-CM | POA: Diagnosis not present

## 2023-09-24 DIAGNOSIS — I339 Acute and subacute endocarditis, unspecified: Secondary | ICD-10-CM | POA: Diagnosis not present

## 2023-09-24 DIAGNOSIS — R5381 Other malaise: Secondary | ICD-10-CM | POA: Diagnosis not present

## 2023-09-24 DIAGNOSIS — G9349 Other encephalopathy: Secondary | ICD-10-CM | POA: Diagnosis not present

## 2023-09-24 DIAGNOSIS — R7881 Bacteremia: Secondary | ICD-10-CM | POA: Diagnosis not present

## 2023-09-24 DIAGNOSIS — G809 Cerebral palsy, unspecified: Secondary | ICD-10-CM | POA: Diagnosis not present

## 2023-09-24 DIAGNOSIS — A419 Sepsis, unspecified organism: Secondary | ICD-10-CM | POA: Diagnosis not present

## 2023-09-24 DIAGNOSIS — M6240 Contracture of muscle, unspecified site: Secondary | ICD-10-CM | POA: Diagnosis not present

## 2023-09-25 DIAGNOSIS — I339 Acute and subacute endocarditis, unspecified: Secondary | ICD-10-CM | POA: Diagnosis not present

## 2023-09-25 DIAGNOSIS — R000 Tachycardia, unspecified: Secondary | ICD-10-CM | POA: Diagnosis not present

## 2023-09-25 DIAGNOSIS — R1310 Dysphagia, unspecified: Secondary | ICD-10-CM | POA: Diagnosis not present

## 2023-09-25 DIAGNOSIS — A419 Sepsis, unspecified organism: Secondary | ICD-10-CM | POA: Diagnosis not present

## 2023-09-25 DIAGNOSIS — E46 Unspecified protein-calorie malnutrition: Secondary | ICD-10-CM | POA: Diagnosis not present

## 2023-09-25 DIAGNOSIS — G809 Cerebral palsy, unspecified: Secondary | ICD-10-CM | POA: Diagnosis not present

## 2023-09-25 DIAGNOSIS — R188 Other ascites: Secondary | ICD-10-CM | POA: Diagnosis not present

## 2023-09-25 DIAGNOSIS — R918 Other nonspecific abnormal finding of lung field: Secondary | ICD-10-CM | POA: Diagnosis not present

## 2023-09-27 DIAGNOSIS — Z4659 Encounter for fitting and adjustment of other gastrointestinal appliance and device: Secondary | ICD-10-CM | POA: Diagnosis not present

## 2023-09-27 DIAGNOSIS — R0989 Other specified symptoms and signs involving the circulatory and respiratory systems: Secondary | ICD-10-CM | POA: Diagnosis not present

## 2023-09-27 DIAGNOSIS — R109 Unspecified abdominal pain: Secondary | ICD-10-CM | POA: Diagnosis not present

## 2023-10-04 ENCOUNTER — Ambulatory Visit: Payer: Medicare Other | Admitting: Internal Medicine

## 2023-10-05 DIAGNOSIS — B9562 Methicillin resistant Staphylococcus aureus infection as the cause of diseases classified elsewhere: Secondary | ICD-10-CM | POA: Diagnosis not present

## 2023-10-05 DIAGNOSIS — G809 Cerebral palsy, unspecified: Secondary | ICD-10-CM | POA: Diagnosis not present

## 2023-10-05 DIAGNOSIS — L89159 Pressure ulcer of sacral region, unspecified stage: Secondary | ICD-10-CM | POA: Diagnosis not present

## 2023-10-06 DIAGNOSIS — G809 Cerebral palsy, unspecified: Secondary | ICD-10-CM | POA: Diagnosis not present

## 2023-10-06 DIAGNOSIS — R109 Unspecified abdominal pain: Secondary | ICD-10-CM | POA: Diagnosis not present

## 2023-10-06 DIAGNOSIS — L89159 Pressure ulcer of sacral region, unspecified stage: Secondary | ICD-10-CM | POA: Diagnosis not present

## 2023-10-06 DIAGNOSIS — B9562 Methicillin resistant Staphylococcus aureus infection as the cause of diseases classified elsewhere: Secondary | ICD-10-CM | POA: Diagnosis not present

## 2023-10-06 DIAGNOSIS — Z4659 Encounter for fitting and adjustment of other gastrointestinal appliance and device: Secondary | ICD-10-CM | POA: Diagnosis not present

## 2023-10-08 DIAGNOSIS — R519 Headache, unspecified: Secondary | ICD-10-CM | POA: Diagnosis not present

## 2023-10-11 DIAGNOSIS — R109 Unspecified abdominal pain: Secondary | ICD-10-CM | POA: Diagnosis not present

## 2023-10-12 DIAGNOSIS — R109 Unspecified abdominal pain: Secondary | ICD-10-CM | POA: Diagnosis not present

## 2023-10-13 DIAGNOSIS — R109 Unspecified abdominal pain: Secondary | ICD-10-CM | POA: Diagnosis not present

## 2024-01-01 DEATH — deceased

## 2024-04-21 ENCOUNTER — Encounter: Payer: Self-pay | Admitting: *Deleted
